# Patient Record
Sex: Male | Born: 1975
Health system: Southern US, Community
[De-identification: ages and names within clinical notes are randomized; demographics above are authoritative.]

## PROBLEM LIST (undated history)

## (undated) DIAGNOSIS — F1120 Opioid dependence, uncomplicated: Secondary | ICD-10-CM

## (undated) DIAGNOSIS — F329 Major depressive disorder, single episode, unspecified: Secondary | ICD-10-CM

## (undated) DIAGNOSIS — F32A Depression, unspecified: Secondary | ICD-10-CM

## (undated) DIAGNOSIS — F419 Anxiety disorder, unspecified: Secondary | ICD-10-CM

## (undated) DIAGNOSIS — J189 Pneumonia, unspecified organism: Secondary | ICD-10-CM

## (undated) DIAGNOSIS — F410 Panic disorder [episodic paroxysmal anxiety] without agoraphobia: Secondary | ICD-10-CM

## (undated) DIAGNOSIS — J40 Bronchitis, not specified as acute or chronic: Secondary | ICD-10-CM

## (undated) DIAGNOSIS — J449 Chronic obstructive pulmonary disease, unspecified: Secondary | ICD-10-CM

## (undated) DIAGNOSIS — N4400 Torsion of testis, unspecified: Secondary | ICD-10-CM

## (undated) HISTORY — DX: Chronic obstructive pulmonary disease, unspecified: J44.9

## (undated) HISTORY — PX: APPENDECTOMY: SHX54

## (undated) HISTORY — PX: EYE SURGERY: SHX253

## (undated) HISTORY — PX: VASECTOMY: SHX75

## (undated) HISTORY — PX: NASAL SINUS SURGERY: SHX719

---

## 2015-09-02 DIAGNOSIS — F1110 Opioid abuse, uncomplicated: Secondary | ICD-10-CM | POA: Diagnosis present

## 2015-09-02 DIAGNOSIS — Z720 Tobacco use: Secondary | ICD-10-CM | POA: Diagnosis not present

## 2015-09-02 DIAGNOSIS — R5383 Other fatigue: Secondary | ICD-10-CM | POA: Diagnosis not present

## 2015-09-02 DIAGNOSIS — Z87438 Personal history of other diseases of male genital organs: Secondary | ICD-10-CM | POA: Diagnosis not present

## 2015-09-02 DIAGNOSIS — F1123 Opioid dependence with withdrawal: Secondary | ICD-10-CM | POA: Insufficient documentation

## 2015-09-02 DIAGNOSIS — R51 Headache: Secondary | ICD-10-CM | POA: Insufficient documentation

## 2015-09-02 DIAGNOSIS — R6883 Chills (without fever): Secondary | ICD-10-CM | POA: Insufficient documentation

## 2015-09-02 DIAGNOSIS — R110 Nausea: Secondary | ICD-10-CM | POA: Diagnosis not present

## 2015-09-02 DIAGNOSIS — R002 Palpitations: Secondary | ICD-10-CM | POA: Insufficient documentation

## 2015-09-03 ENCOUNTER — Encounter (HOSPITAL_COMMUNITY): Payer: Self-pay | Admitting: Emergency Medicine

## 2015-09-03 ENCOUNTER — Emergency Department (HOSPITAL_COMMUNITY)
Admission: EM | Admit: 2015-09-03 | Discharge: 2015-09-03 | Disposition: A | Discharge status: Home / Self Care | Payer: Medicaid Other | Attending: Emergency Medicine | Admitting: Emergency Medicine

## 2015-09-03 DIAGNOSIS — F1123 Opioid dependence with withdrawal: Secondary | ICD-10-CM

## 2015-09-03 DIAGNOSIS — F1193 Opioid use, unspecified with withdrawal: Secondary | ICD-10-CM

## 2015-09-03 HISTORY — DX: Torsion of testis, unspecified: N44.00

## 2015-09-03 MED ORDER — ONDANSETRON 8 MG PO TBDP: 8.0000 mg | ORAL_TABLET | Freq: Three times a day (TID) | ORAL | Status: DC | PRN

## 2015-09-03 MED ORDER — ONDANSETRON 8 MG PO TBDP
8.0000 mg | ORAL_TABLET | Freq: Once | ORAL | Status: AC
  Administered 2015-09-03: 8 mg via ORAL
  Filled 2015-09-03: qty 1

## 2015-09-03 MED ORDER — PROMETHAZINE HCL 25 MG PO TABS: 25.0000 mg | ORAL_TABLET | Freq: Three times a day (TID) | ORAL | Status: DC | PRN

## 2015-09-03 MED ORDER — CLONIDINE HCL 0.1 MG PO TABS: 0.1000 mg | ORAL_TABLET | Freq: Two times a day (BID) | ORAL | Status: DC

## 2015-09-03 NOTE — ED Provider Notes (Signed)
CSN: [id number]     Arrival date & time 09/02/15  2340 History   First MD Initiated Contact with Patient 09/03/15 0029     Chief Complaint  Patient presents with  . Withdrawal      HPI  Patient presents for methadone withdrawal.  He reports his last dose was 2 days ago. He reports starting one day ago he has had chills, nausea, abdominal cramping, palpitations He denies active CP/SOB His course is worsening Nothing relieves his symptoms He denies any other drug or ETOH abuse He reports unable to get to his methadone clinic due to transportation issues    Past Medical History  Diagnosis Date  . Testicular torsion    Past Surgical History  Procedure Laterality Date  . Appendectomy    . Vasectomy     History reviewed. No pertinent family history. Social History  Substance Use Topics  . Smoking status: Current Every Day Smoker  . Smokeless tobacco: None  . Alcohol Use: No    Review of Systems  Constitutional: Positive for chills and fatigue. Negative for fever.  Cardiovascular: Positive for palpitations.  Gastrointestinal: Positive for nausea. Negative for diarrhea.  Neurological: Positive for dizziness and headaches.  All other systems reviewed and are negative.     Allergies  Review of patient's allergies indicates no known allergies.  Home Medications   Prior to Admission medications   Medication Sig Start Date End Date Taking? Authorizing Provider  ibuprofen (ADVIL,MOTRIN) 800 MG tablet Take 800 mg by mouth every 8 (eight) hours as needed.   Yes Historical Provider, MD  methadone (DOLOPHINE) 10 MG tablet Take 45 mg by mouth every 8 (eight) hours.   Yes Historical Provider, MD  cloNIDine (CATAPRES) 0.1 MG tablet Take 1 tablet (0.1 mg total) by mouth 2 (two) times daily. 09/03/15   [name], MD  ondansetron (ZOFRAN ODT) 8 MG disintegrating tablet Take 1 tablet (8 mg total) by mouth every 8 (eight) hours as needed for refractory nausea / vomiting.  ODT q4  hours prn nausea 09/03/15   [name], MD  promethazine (PHENERGAN) 25 MG tablet Take 1 tablet (25 mg total) by mouth every 8 (eight) hours as needed for nausea or vomiting. 09/03/15   [name], MD   BP 133/72 mmHg  Pulse 70  Temp(Src) 98.1 F (36.7 C) (Oral)  Resp 19  Ht  (1.778 m)  Wt 200 lb (90.719 kg)  BMI 28.70 kg/m2  SpO2 100% Physical Exam CONSTITUTIONAL: Well developed/well nourished HEAD: Normocephalic/atraumatic EYES: EOMI/PERRL ENMT: Mucous membranes moist NECK: supple no meningeal signs SPINE/BACK:entire spine nontender CV: S1/S2 noted, no murmurs/rubs/gallops noted LUNGS: Lungs are clear to auscultation bilaterally, no apparent distress ABDOMEN: soft, nontender, no rebound or guarding, bowel sounds noted throughout abdomen GU:no cva tenderness NEURO: Pt is awake/alert/appropriate, moves all extremitiesx4.  No facial droop.   EXTREMITIES: pulses normal/equal, full ROM SKIN: warm, color normal PSYCH: no abnormalities of mood noted, alert and oriented to situation  ED Course  Procedures  Pt well appearing No distress noted Suspect all symptoms are from opiate withdrawal He does not want narcotics Will give antiemetics and clonidine - he has used clonidine before    EKG Interpretation   Date/Time:  Sunday September 03 2015 00:15:26 EDT Ventricular Rate:  73 PR Interval:  149 QRS Duration: 86 QT Interval:  398 QTC Calculation: 439 R Axis:   39 Text Interpretation:  Sinus rhythm Normal ECG No previous ECGs available  Confirmed by Bebe Shaggy  MD,  Paarth Cropper ([id number]) on 09/03/2015 12:32:28 AM      MDM   Final diagnoses:  Opiate withdrawal    Nursing notes including past medical history and social history reviewed and considered in documentation     [name], MD 09/03/15 [phone]

## 2015-09-03 NOTE — ED Notes (Signed)
Patient reports has been going to methadone clinics for 4 years. States hasn't been able to go to clinic and last took medication on Wednesday. Patient reports he believe he is having withdrawal symptoms. Complaining of chest pain, headache, and dizziness.

## 2015-09-03 NOTE — ED Notes (Signed)
Discharge instructions given, pt demonstrated teach back and verbal understanding. No concerns voiced.  

## 2015-09-06 ENCOUNTER — Emergency Department (HOSPITAL_COMMUNITY)
Admission: EM | Admit: 2015-09-06 | Discharge: 2015-09-06 | Disposition: A | Discharge status: Home / Self Care | Payer: Medicaid Other | Attending: Emergency Medicine | Admitting: Emergency Medicine

## 2015-09-06 ENCOUNTER — Encounter (HOSPITAL_COMMUNITY): Payer: Self-pay | Admitting: Emergency Medicine

## 2015-09-06 DIAGNOSIS — Z87438 Personal history of other diseases of male genital organs: Secondary | ICD-10-CM | POA: Diagnosis not present

## 2015-09-06 DIAGNOSIS — F1123 Opioid dependence with withdrawal: Secondary | ICD-10-CM | POA: Insufficient documentation

## 2015-09-06 DIAGNOSIS — F1193 Opioid use, unspecified with withdrawal: Secondary | ICD-10-CM

## 2015-09-06 DIAGNOSIS — R42 Dizziness and giddiness: Secondary | ICD-10-CM | POA: Diagnosis present

## 2015-09-06 DIAGNOSIS — Z720 Tobacco use: Secondary | ICD-10-CM | POA: Diagnosis not present

## 2015-09-06 DIAGNOSIS — Z79899 Other long term (current) drug therapy: Secondary | ICD-10-CM | POA: Insufficient documentation

## 2015-09-06 MED ORDER — CLONIDINE HCL 0.1 MG PO TABS: 0.1000 mg | ORAL_TABLET | Freq: Two times a day (BID) | ORAL | Status: DC

## 2015-09-06 MED ORDER — ONDANSETRON 4 MG PO TBDP
4.0000 mg | ORAL_TABLET | Freq: Once | ORAL | Status: AC
  Administered 2015-09-06: 4 mg via ORAL
  Filled 2015-09-06: qty 1

## 2015-09-06 MED ORDER — HYDROXYZINE HCL 25 MG PO TABS: 25.0000 mg | ORAL_TABLET | Freq: Four times a day (QID) | ORAL | Status: DC | PRN

## 2015-09-06 MED ORDER — DICYCLOMINE HCL 20 MG PO TABS: 20.0000 mg | ORAL_TABLET | Freq: Two times a day (BID) | ORAL | Status: DC

## 2015-09-06 MED ORDER — KETOROLAC TROMETHAMINE 60 MG/2ML IM SOLN
60.0000 mg | Freq: Once | INTRAMUSCULAR | Status: AC
  Administered 2015-09-06: 60 mg via INTRAMUSCULAR
  Filled 2015-09-06: qty 2

## 2015-09-06 MED ORDER — ONDANSETRON 4 MG PO TBDP: 4.0000 mg | ORAL_TABLET | Freq: Three times a day (TID) | ORAL | Status: DC | PRN

## 2015-09-06 MED ORDER — LORAZEPAM 2 MG/ML IJ SOLN
1.0000 mg | Freq: Once | INTRAMUSCULAR | Status: AC
  Administered 2015-09-06: 1 mg via INTRAMUSCULAR
  Filled 2015-09-06: qty 1

## 2015-09-06 MED ORDER — DICYCLOMINE HCL 10 MG/ML IM SOLN
20.0000 mg | Freq: Once | INTRAMUSCULAR | Status: AC
  Administered 2015-09-06: 20 mg via INTRAMUSCULAR
  Filled 2015-09-06: qty 2

## 2015-09-06 NOTE — Discharge Instructions (Signed)
Opioid Withdrawal °Opioids are a group of narcotic drugs. They include the street drug heroin. They also include pain medicines, such as morphine, hydrocodone, oxycodone, and fentanyl. Opioid withdrawal is a group of characteristic physical and mental signs and symptoms. It typically occurs if you have been using opioids daily for several weeks or longer and stop using or rapidly decrease use. Opioid withdrawal can also occur if you have used opioids daily for a long time and are given a medicine to block the effect.  °SIGNS AND SYMPTOMS °Opioid withdrawal includes three or more of the following symptoms:  °· Depressed, anxious, or irritable mood. °· Nausea or vomiting. °· Muscle aches or spasms.   °· Watery eyes.    °· Runny nose. °· Dilated pupils, sweating, or hairs standing on end. °· Diarrhea or intestinal cramping. °· Yawning.   °· Fever. °· Increased blood pressure. °· Fast pulse. °· Restlessness or trouble sleeping. °These signs and symptoms occur within several hours of stopping or reducing short-acting opioids, such as heroin. They can occur within 3 days of stopping or reducing long-acting opioids, such as methadone. Withdrawal begins within minutes of receiving a drug that blocks the effects of opioids, such as naltrexone or naloxone. °DIAGNOSIS  °Opioid use disorder is diagnosed by your health care provider. You will be asked about your symptoms, drug and alcohol use, medical history, and use of medicines. A physical exam may be done. Lab tests may be ordered. Your health care provider may have you see a mental health professional.  °TREATMENT  °The treatment for opioid withdrawal is usually provided by medical doctors with special training in substance use disorders (addiction specialists). The following medicines may be included in treatment: °· Opioids given in place of the abused opioid. They turn on opioid receptors in the brain and lessen or prevent withdrawal symptoms. They are gradually  decreased (opioid substitution and taper). °· Non-opioids that can lessen certain opioid withdrawal symptoms. They may be used alone or with opioid substitution and taper. °Successful long-term recovery usually requires medicine, counseling, and group support. °HOME CARE INSTRUCTIONS  °· Take medicines only as directed by your health care provider. °· Check with your health care provider before starting new medicines. °· Keep all follow-up visits as directed by your health care provider. °SEEK MEDICAL CARE IF: °· You are not able to take your medicines as directed. °· Your symptoms get worse. °· You relapse. °SEEK IMMEDIATE MEDICAL CARE IF: °· You have serious thoughts about hurting yourself or others. °· You have a seizure. °· You lose consciousness. °Document Released: 12/19/2003 Document Revised: 05/02/2014 Document Reviewed: 12/29/2013 °ExitCare® Patient Information ©2015 [employer]. This information is not intended to replace advice given to you by your health care provider. Make sure you discuss any questions you have with your health care provider. ° °Emergency Department Resource Guide °1) Find a Doctor and Pay Out of Pocket °Although you won't have to find out who is covered by your insurance plan, it is a good idea to ask around and get recommendations. You will then need to call the office and see if the doctor you have chosen will accept you as a new patient and what types of options they offer for patients who are self-pay. Some doctors offer discounts or will set up payment plans for their patients who do not have insurance, but you will need to ask so you aren't surprised when you get to your appointment. ° °2) Contact Your Local Health Department °Not all health departments have   doctors that can see patients for sick visits, but many do, so it is worth a call to see if yours does. If you don't know where your local health department is, you can check in your phone book. The CDC also has a tool to  help you locate your state's health department, and many state websites also have listings of all of their local health departments. ° °3) Find a Walk-in Clinic °If your illness is not likely to be very severe or complicated, you may want to try a walk in clinic. These are popping up all over the country in pharmacies, drugstores, and shopping centers. They're usually staffed by nurse practitioners or physician assistants that have been trained to treat common illnesses and complaints. They're usually fairly quick and inexpensive. However, if you have serious medical issues or chronic medical problems, these are probably not your best option. ° °No Primary Care Doctor: °- Call [facility] at  832-8000 - they can help you locate a primary care doctor that  accepts your insurance, provides certain services, etc. °- Physician Referral Service- 1-800-533-3463 ° °Chronic Pain Problems: °Organization         Address  Phone   Notes  °[facility]  (336) 297-2271 Patients need to be referred by their primary care doctor.  ° °Medication Assistance: °Organization         Address  Phone   Notes  °[place] [facility] [address] °[place], [place] [address] (336) 641-8030 --Must be a resident of [place] °-- Must have NO insurance coverage whatsoever (no Medicaid/ Medicare, etc.) °-- The pt. MUST have a primary care doctor that directs their care regularly and follows them in the community °  °[facility]  (866) 331-1348   °[employer]  (888) 892-1162   ° °Agencies that provide inexpensive medical care: °Organization         Address  Phone   Notes  °[facility]  (336) 832-8035   ° Internal Medicine    (336) 832-7272   °[facility] [address] °[place], [place] [address] (336) 832-4777   °[facility] [address], °[place] (336) 271-4999   °[facility]    (336) 373-0678   °[facility]    (336) 272-1050   °[facility] ° [address], [place] Phone:  (336) 832-4444, Fax:  (336) 832-4440 Hours of Operation:  9 am - 6 pm, M-F.  Also accepts Medicaid/Medicare and self-pay.  °[facility] ° [address], [place] Phone: (336) 832-3150, Fax: (336) 832-3151. Hours of Operation:  8:30 am - 5:30 pm, M-F.  Also accepts Medicaid and self-pay.  °[facility] [place] [address], [place] Phone: (336) 878-6027   °[facility] [address], [place], [place] (336)723-1848, Ext. 123 Mondays & Thursdays: 7-9 AM.  First 15 patients are seen on a first come, first serve basis. °  ° °Medicaid-accepting [place] Providers: ° °Organization         Address  Phone   Notes  °[facility] [address], [place] (336) 641-2100 Also accepts self-pay patients.  °[facility] [address], [place] ° (336) 856-9996   °[facility] [address], [place] (336) 288-8857   °[facility] [address]   [place] [address], [place] (336) 299-7000   °Veita Bland [address], [place]  ° (336) 373-1557 Only accepts [place] Access Medicaid patients after they have their name applied to their card.  ° °Self-Pay (no insurance) in [place]: ° °Organization         Address  Phone   Notes  °Sickle Cell Patients, [facility] [address], [place] (336) 832-1970   °[facility] [address], [place] (336) 832-4400   °[facility] [place] ° [address], [place] (336) 992-4800   °[facility]/Dr. Osei-Bonsu ° [address] [place] [address], [place] or [address], [place] (336) 841-8500 Phone number for both [place] and [place] locations is the same.  °[facility] [address],  [place] (336) 299-0000   °[facility] [address] [place] [address], [place] or [address] (336) 852-7530 °(336) 878-2260   °[facility] [address], [place] (336) 350-1642, phone; (336) 294-5005, fax Sees patients 1st and 3rd Saturday of every month.  Must not qualify for public or private insurance (i.e. Medicaid, Medicare, [place] Health Choice, Veterans' Benefits) • Household income should be no more than 200% of the poverty level •The clinic cannot treat you if you are pregnant or think you are pregnant • Sexually transmitted diseases are not treated at the clinic.  ° ° °Dental Care: °Organization         Address  Phone  Notes  °[facility] [address], [place] (336) 641-6152 Accepts children up to age 21 who are enrolled in Medicaid or [place] Health Choice; pregnant women with a Medicaid card; and children who have applied for Medicaid or [place] Health Choice, but were declined, whose parents can pay a reduced fee at time of service.  °[facility] [place]  [address], [place] (336) 641-7733 Accepts children up to age 21 who are enrolled in Medicaid or [place] Health Choice; pregnant women with a Medicaid card; and children who have applied for Medicaid or  Health Choice, but were declined, whose parents can pay a reduced fee at time of service.  °[facility] ° [address], [place] (336) 641-4533 Patients are seen by appointment only. Walk-ins are not accepted. [facility] will see patients 18 years of age and older. °Monday - Tuesday (8am-5pm) °Most Wednesdays (8:30-5pm) °$30 per visit, cash only  °[facility] ° [address], [place] (336) 641-4533 Patients are seen by appointment only. Walk-ins are not accepted. [facility] will see patients 18 years of age and older. °One Wednesday Evening  (Monthly: Volunteer Based).  $30 per visit, cash only  °[facility]  (919) 537-3737 for adults; Children under age 4, call Graduate Pediatric Dentistry at (919) [phone]. Children aged 4-14, please call (919) [phone] to request a pediatric application. ° Dental services are provided in all areas of dental care including fillings, crowns and bridges, complete and partial dentures, implants, gum treatment, root canals, and extractions. Preventive care is also provided. Treatment is provided to both adults and children. °Patients are selected via a lottery and there is often a waiting list. °  °[facility] [address], °[place] ° (336) 763-8833 [email or web address] °  °[facility] [address], [place], [place] (336)723-1848, Ext. 123 Second and Fourth Thursday of each month, opens at 6:30 AM; Clinic ends at 9 AM.  Patients are seen on a first-come first-served basis, and a limited number are seen during each clinic.  ° °[facility] ° [address], [place], [place] (336) 723-7904   Eligibility Requirements °You must have lived in [place], [place], or [place] counties for at least the last three months. °  You cannot be eligible for state or federal sponsored healthcare insurance, including Veterans Administration, Medicaid, or Medicare. °  You generally cannot be eligible for healthcare insurance through your employer.  °  How to apply: °Eligibility screenings are held every Tuesday and Wednesday afternoon from 1:00 pm until 4:00 pm. You do not need an appointment for the interview!  °[facility] [address], [place], [place] 336-631-2330   °[facility]  336-342-8273   °[facility]  336-703-3100   °[facility]  336-570-6415   ° °Behavioral Health Resources in the Community: °Intensive Outpatient Programs °Organization         Address  Phone  Notes  °[place]  [facility] [address], [place], [place] 336-878-6098   °[facility] [address], [place], [place] 336-832-9800   °[facility] [address], [place], [place] ° 336-882-2125   °[facility] [address],  °[place], [place] 1-800-853-5163 or 336-641-4981   °Substance Abuse Resources °Organization         Address  Phone  Notes  °[facility]  336-882-2125   °[facility]  336-784-9470   °[facility]  336-285-9073   °[facility]  336-845-3988   °Residential & Outpatient Substance Abuse Program  1-800-659-3381   °Psychological Services °Organization         Address  Phone  Notes  °[facility]  336- 832-9600   °[facility]  336- 378-7881   °[facility] [address], [place] 1-800-853-5163 or 336-641-4981   ° °Mobile Crisis Teams °Organization         Address  Phone  Notes  °[facility], Mobile Crisis Care Unit  1-877-626-1772   °Assertive °[facility] ° [address] [place], [place] 336-834-9664   °Sharon DeEsch [address] °[place] 336-554-5454   ° °Self-Help/Support Groups °Organization         Address  Phone             Notes  °[facility] - variety of support groups  336- 373-1402 Call for more information  °Narcotics Anonymous (NA), [facility] [address], °[place]  2 meetings at this location  ° °Residential Treatment Programs °Organization         Address  Phone  Notes  °[facility] [address],    °[place]  1-866-801-8205   °[facility] ° [address], [place], [place] 704-293-8524   °[facility] [address], [place] 336-845-3988 Admissions: 8am-3pm M-F  °[facility] [address]    °[place], [place] 336-841-1104   °[facility] [address],  [place], [place] 336-379-7146   °[facility] [address]  °[place],  336-285-9073   °[facility] Intensive Outpatient [address], [place],   [place] 336-852-3033   °[facility] [address]  °[place], [place] 1-877-615-2722 or 336-784-9470   °[facility] [address] [place], [place] 336-227-7417 Accepts Medicaid  °[facility] [address]  °[place] 1-800-659-3381 Substance Abuse/Addiction Treatment  ° °[place] Behavioral Health Resources °Organization         Address  Phone  Notes  °[facility]  (888) 581-9988   °Julie Brannon, PhD [address] [place], [place]   (336) 349-5553 or (336) 951-0000   °[facility]   [address] °[place], [place] (336) 349-4454   °[facility] [address], [place], [place] (336) 342-8316 Insurance/Medicaid/sponsorship through [facility]  °[facility] [address]                                    [place], [place] (336) 342-8316 Therapy/tele-psych/case  °[facility] [address]  ° [place], [place] (336) 349-2233    °Dr. Arfeen  (336) 349-4544   °[facility] [place]  [employer] [place] [facility] 1) [address], [place] °2) [address], [place] °3)  [address], [place] (336) 349-3220 °(336) 342-7768 ° °(336) 342-8140   °[place] [facility] (336) 342-1394 or (336) 342-3537 (After Hours)    ° ° °

## 2015-09-06 NOTE — ED Notes (Signed)
Pt states he is unable to get the clonidine prescription filled; pt states he is not seeking narcotics he just wants something to help him with his withdrawal symptoms

## 2015-09-06 NOTE — ED Notes (Signed)
Pt c/o withdrawals symptoms from coming off methadone 

## 2015-09-06 NOTE — ED Provider Notes (Signed)
This chart was scribed for [name], DO by Budd Palmer, ED Scribe. This patient was seen in room [facility]18/[facility]18 and the patient's care was started at 12:57 AM.  TIME SEEN: 12:57 AM   CHIEF COMPLAINT: Withdrawal  HPI: John [name] is a 39 y.o. male who presents to the Emergency Department complaining of withdrawal from methadone 40 mg onset 4 days ago. He reports associated panic attacks, abdominal cramping, loss of appetite, dizziness, inability to sleep, and skin hypersensitivity. He notes he has been clean for 2 years from here when has been on methadone. He states he has been tapering off from 110 mg, to 80 mg, and then down to 40 mg. He states he has has not been able to get his methadone for the past 4 days because of transportation issues. Was seen in the emergency department on September 4. Was discharged with clonidine, Zofran and Phenergan. He states he is taking clonidine with mild relief. He does not want any narcotics. Pt denies SI or HI.  ROS: See HPI Constitutional: no fever  Eyes: no drainage  ENT: no runny nose   Cardiovascular:  no chest pain  Resp: no SOB  GI: no vomiting GU: no dysuria Integumentary: no rash  Allergy: no hives  Musculoskeletal: no leg swelling  Neurological: no slurred speech ROS otherwise negative  PAST MEDICAL HISTORY/PAST SURGICAL HISTORY:  Past Medical History  Diagnosis Date  . Testicular torsion     MEDICATIONS:  Prior to Admission medications   Medication Sig Start Date End Date Taking? Authorizing Provider  cloNIDine (CATAPRES) 0.1 MG tablet Take 1 tablet (0.1 mg total) by mouth 2 (two) times daily. 09/03/15   [name], MD  ibuprofen (ADVIL,MOTRIN) 800 MG tablet Take 800 mg by mouth every 8 (eight) hours as needed.    Historical Provider, MD  methadone (DOLOPHINE) 10 MG tablet Take 45 mg by mouth every 8 (eight) hours.    Historical Provider, MD  ondansetron (ZOFRAN ODT) 8 MG disintegrating tablet Take 1 tablet (8 mg total) by  mouth every 8 (eight) hours as needed for refractory nausea / vomiting. 8mg  ODT q4 hours prn nausea 09/03/15   [name], MD  promethazine (PHENERGAN) 25 MG tablet Take 1 tablet (25 mg total) by mouth every 8 (eight) hours as needed for nausea or vomiting. 09/03/15   [name], MD    ALLERGIES:  No Known Allergies  SOCIAL HISTORY:  Social History  Substance Use Topics  . Smoking status: Current Every Day Smoker  . Smokeless tobacco: Not on file  . Alcohol Use: No    FAMILY HISTORY: History reviewed. No pertinent family history.  EXAM: BP 121/84 mmHg  Pulse 71  Temp(Src) 97.9 F (36.6 C)  Resp 18  Ht 5\' 10"  (1.778 m)  Wt 200 lb (90.719 kg)  BMI 28.70 kg/m2  SpO2 97% CONSTITUTIONAL: Alert and oriented and responds appropriately to questions. Well-appearing; well-nourished, does not appear in any significant distress, appears well-hydrated, afebrile, nontoxic appearing HEAD: Normocephalic EYES: Conjunctivae clear, PERRL ENT: normal nose; no rhinorrhea; moist mucous membranes; pharynx without lesions noted NECK: Supple, no meningismus, no LAD  CARD: RRR; S1 and S2 appreciated; no murmurs, no clicks, no rubs, no gallops RESP: Normal chest excursion without splinting or tachypnea; breath sounds clear and equal bilaterally; no wheezes, no rhonchi, no rales, no hypoxia or respiratory distress, speaking full sentences ABD/GI: Normal bowel sounds; non-distended; soft, non-tender, no rebound, no guarding, no peritoneal signs BACK:  The back appears normal  and is non-tender to palpation, there is no CVA tenderness EXT: Normal ROM in all joints; non-tender to palpation; no edema; normal capillary refill; no cyanosis, no calf tenderness or swelling    SKIN: Normal color for age and race; warm NEURO: Moves all extremities equally, sensation to light touch intact diffusely, cranial nerves II through XII intact, normal gait, patient is not tremulous PSYCH: The patient's mood and manner  are appropriate. Grooming and personal hygiene are appropriate. Denies SI or HI.  MEDICAL DECISION MAKING: Patient here with opiate withdrawal. He is hemodynamically stable and does not appear to be in significant distress. Has been taking clonidine with some relief reports that he has 2 tablets left. Will refill his clonidine, Zofran. Will also discharge with Vistaril and Bentyl to use as needed for sometimes. Have provided him outpatient resources. No withdrawal from benzodiazepines or alcohol. No psychiatric safety concerns. Discussed return precautions. I feel he is safe to be discharged without further emergent workup. He verbalized understanding and is comfortable with plan.  I personally performed the services described in this documentation, which was scribed in my presence. The recorded information has been reviewed and is accurate.   [name], DO 09/06/15 0140

## 2015-09-10 ENCOUNTER — Emergency Department (HOSPITAL_COMMUNITY)
Admission: EM | Admit: 2015-09-10 | Discharge: 2015-09-11 | Disposition: A | Discharge status: Home / Self Care | Payer: Medicaid Other | Attending: Emergency Medicine | Admitting: Emergency Medicine

## 2015-09-10 ENCOUNTER — Encounter (HOSPITAL_COMMUNITY): Payer: Self-pay | Admitting: Emergency Medicine

## 2015-09-10 DIAGNOSIS — F329 Major depressive disorder, single episode, unspecified: Secondary | ICD-10-CM | POA: Insufficient documentation

## 2015-09-10 DIAGNOSIS — F410 Panic disorder [episodic paroxysmal anxiety] without agoraphobia: Secondary | ICD-10-CM | POA: Diagnosis not present

## 2015-09-10 DIAGNOSIS — Z720 Tobacco use: Secondary | ICD-10-CM | POA: Diagnosis not present

## 2015-09-10 DIAGNOSIS — Z765 Malingerer [conscious simulation]: Secondary | ICD-10-CM

## 2015-09-10 DIAGNOSIS — Z87438 Personal history of other diseases of male genital organs: Secondary | ICD-10-CM | POA: Diagnosis not present

## 2015-09-10 DIAGNOSIS — F32A Depression, unspecified: Secondary | ICD-10-CM

## 2015-09-10 HISTORY — DX: Anxiety disorder, unspecified: F41.9

## 2015-09-10 HISTORY — DX: Major depressive disorder, single episode, unspecified: F32.9

## 2015-09-10 HISTORY — DX: Depression, unspecified: F32.A

## 2015-09-10 MED ORDER — ONDANSETRON HCL 4 MG PO TABS: 4.0000 mg | ORAL_TABLET | Freq: Three times a day (TID) | ORAL | Status: DC | PRN

## 2015-09-10 MED ORDER — ALUM & MAG HYDROXIDE-SIMETH 200-200-20 MG/5ML PO SUSP: 30.0000 mL | ORAL | Status: DC | PRN

## 2015-09-10 MED ORDER — NICOTINE 21 MG/24HR TD PT24: 21.0000 mg | MEDICATED_PATCH | Freq: Every day | TRANSDERMAL | Status: DC

## 2015-09-10 MED ORDER — IBUPROFEN 200 MG PO TABS: 600.0000 mg | ORAL_TABLET | Freq: Three times a day (TID) | ORAL | Status: DC | PRN

## 2015-09-10 MED ORDER — ACETAMINOPHEN 325 MG PO TABS: 650.0000 mg | ORAL_TABLET | ORAL | Status: DC | PRN

## 2015-09-10 NOTE — ED Provider Notes (Addendum)
TIME SEEN: 11:05 PM   CHIEF COMPLAINT: Depression   HPI: HPI Comments: John [name] is a 39 y.o. male who presents to the Emergency Department complaining of Depression onset several days prior. Pt states that he feels like his life is "falling apart". Pt states that he has a diabetic son that recently went into DKA, lost his job, his car broke down and is about to be evicted. He states that he is not able to sleep or eat due to the recent stressors in his life. Pt reports an increase in anxiety attacks as well during this time. Pt reports general HI but without a plan. He states that he has homicidal thoughts towards "people that piss me off." Pt denies recent alcohol or drug use. Pt denies SI or other related symptoms.   ROS: See HPI Constitutional: no fever  Eyes: no drainage  ENT: no runny nose   Cardiovascular:  no chest pain  Resp: no SOB  GI: no vomiting GU: no dysuria Integumentary: no rash  Allergy: no hives  Musculoskeletal: no leg swelling  Neurological: no slurred speech ROS otherwise negative  PAST MEDICAL HISTORY/PAST SURGICAL HISTORY:  Past Medical History  Diagnosis Date  . Testicular torsion   . Depression   . Anxiety     MEDICATIONS:  Prior to Admission medications   Medication Sig Start Date End Date Taking? Authorizing Provider  cloNIDine (CATAPRES) 0.1 MG tablet Take 1 tablet (0.1 mg total) by mouth 2 (two) times daily. 09/03/15   [name], MD  cloNIDine (CATAPRES) 0.1 MG tablet Take 1 tablet (0.1 mg total) by mouth 2 (two) times daily. 09/06/15   Masiel Gentzler N Rayna Brenner, DO  dicyclomine (BENTYL) 20 MG tablet Take 1 tablet (20 mg total) by mouth 2 (two) times daily. 09/06/15   Rahil Passey N Latif Nazareno, DO  hydrOXYzine (ATARAX/VISTARIL) 25 MG tablet Take 1 tablet (25 mg total) by mouth every 6 (six) hours as needed for anxiety. 09/06/15   Amarria Andreasen N Eldar Robitaille, DO  ibuprofen (ADVIL,MOTRIN) 800 MG tablet Take 800 mg by mouth every 8 (eight) hours as needed.    Historical Provider, MD   methadone (DOLOPHINE) 10 MG tablet Take 45 mg by mouth every 8 (eight) hours.    Historical Provider, MD  ondansetron (ZOFRAN ODT) 4 MG disintegrating tablet Take 1 tablet (4 mg total) by mouth every 8 (eight) hours as needed for nausea or vomiting. 09/06/15   Kaleea Penner N Yasamin Karel, DO  ondansetron (ZOFRAN ODT) 8 MG disintegrating tablet Take 1 tablet (8 mg total) by mouth every 8 (eight) hours as needed for refractory nausea / vomiting. 8mg  ODT q4 hours prn nausea 09/03/15   [name], MD  promethazine (PHENERGAN) 25 MG tablet Take 1 tablet (25 mg total) by mouth every 8 (eight) hours as needed for nausea or vomiting. 09/03/15   [name], MD    ALLERGIES:  No Known Allergies  SOCIAL HISTORY:  Social History  Substance Use Topics  . Smoking status: Current Every Day Smoker  . Smokeless tobacco: Not on file  . Alcohol Use: No    FAMILY HISTORY: No family history on file.  EXAM: BP 133/76 mmHg  Pulse 66  Temp(Src) 98 F (36.7 C) (Oral)  Resp 18  Ht 5\' 10"  (1.778 m)  Wt 200 lb (90.719 kg)  BMI 28.70 kg/m2  SpO2 99%   CONSTITUTIONAL: Alert and oriented and responds appropriately to questions. Well-appearing; well-nourished HEAD: Normocephalic EYES: Conjunctivae clear, PERRL ENT: normal nose; no rhinorrhea; moist mucous membranes;  pharynx without lesions noted NECK: Supple, no meningismus, no LAD  CARD: RRR; S1 and S2 appreciated; no murmurs, no clicks, no rubs, no gallops RESP: Normal chest excursion without splinting or tachypnea; breath sounds clear and equal bilaterally; no wheezes, no rhonchi, no rales, no hypoxia or respiratory distress, speaking full sentences ABD/GI: Normal bowel sounds; non-distended; soft, non-tender, no rebound, no guarding, no peritoneal signs BACK:  The back appears normal and is non-tender to palpation, there is no CVA tenderness EXT: Normal ROM in all joints; non-tender to palpation; no edema; normal capillary refill; no cyanosis, no calf  tenderness or swelling    SKIN: Normal color for age and race; warm, dry NEURO: Moves all extremities equally, sensation to light touch intact diffusely, cranial nerves II through XII intact, no tremors PSYCH: No SI but pt has Homicidal thoughts toward" people that piss me off"   MEDICAL DECISION MAKING: Pt here with worsening depression and panic attacks. States he has homicidal thoughts towards "people who placed me off". Does not have anyone in specific that he wants to hurt. Denies SI. Given his homicidal thoughts, will consult TTS. Will obtain screening labs and urine. He agrees with this plan. No current medical complaints. Was seen on 9/4 in 9/7 for symptoms of opiate withdrawal. Has not been on methadone since 9/3. No sign of withdrawal currently.  ED PROGRESS: 1:15 AM  Pt now reports he wants to go. States his child is in the PICU and he once to go upstairs. Patient denies SI or HIcurrently. States "I was just being on asshole".  He is able to contract for safety. When asked what he was hoping to gain from this visit today, patient states he was hoping to get medication for his panic attacks. I have a suspicion that there is drug seeking behavior as he has repeatedly requested benzodiazepines during this visit as well as the last visit at [facility]. I do not feel they are currently indicated as he appears very comfortable, not anxious and no sign of withdrawal. Have advised him to follow-up with outpatient provider for this. He has been evaluated by TTS and they have recommended discussion with Behavioral Health extender. Will reconsult TTS to discuss recommendations.  1:40 AM  D/w Corrie Dandy with TTS to his discussion patient's care with [facility]. They recommended outpatient treatment. They state he is ready made contact with [facility] in [facility] Will provide him a resource guide again. They recommend avoiding benzodiazepines in this patient given his drug history and  recommend treating his anxiety with this Vistaril. We'll provide prescription for Vistaril. We'll discharge patient home. He contracts for safety.  I personally performed the services described in this documentation, which was scribed in my presence. The recorded information has been reviewed and is accurate.     Layla Maw Teasia Zapf, DO 09/11/15 0139   2:20 AM  Pt repeatedly asking for Ativan throughout his entire visit. He is upset because he has been prescribed Vistaril however he told me that this helped him previously. I've been instructed by TTS to avoid benzodiazepines and I agree. I strongly feel that patient is here seeking drugs.  Layla Maw Xariah Silvernail, DO 09/11/15 0221
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
## 2015-09-11 LAB — COMPREHENSIVE METABOLIC PANEL
ALBUMIN: 3.8 g/dL (ref 3.5–5.0)
ALT: 19 U/L (ref 17–63)
AST: 17 U/L (ref 15–41)
Alkaline Phosphatase: 94 U/L (ref 38–126)
Anion gap: 9 (ref 5–15)
BUN: 19 mg/dL (ref 6–20)
CHLORIDE: 105 mmol/L (ref 101–111)
CO2: 26 mmol/L (ref 22–32)
CREATININE: 0.85 mg/dL (ref 0.61–1.24)
Calcium: 9.1 mg/dL (ref 8.9–10.3)
GFR calc Af Amer: >60 mL/min (ref >60)
GFR calc non Af Amer: >60 mL/min (ref >60)
Glucose, Bld: 137 mg/dL — ABNORMAL HIGH (ref 65–99)
Potassium: 3.5 mmol/L (ref 3.5–5.1)
SODIUM: 140 mmol/L (ref 135–145)
Total Bilirubin: 0.4 mg/dL (ref 0.3–1.2)
Total Protein: 6.7 g/dL (ref 6.5–8.1)

## 2015-09-11 LAB — CBC WITH DIFFERENTIAL/PLATELET
Basophils Absolute: 0 10*3/uL (ref 0.0–0.1)
Basophils Relative: 0 % (ref 0–1)
EOS PCT: 5 % (ref 0–5)
Eosinophils Absolute: 0.5 10*3/uL (ref 0.0–0.7)
HCT: 39.8 % (ref 39.0–52.0)
Hemoglobin: 13.6 g/dL (ref 13.0–17.0)
LYMPHS ABS: 3 10*3/uL (ref 0.7–4.0)
LYMPHS PCT: 30 % (ref 12–46)
MCH: 29.8 pg (ref 26.0–34.0)
MCHC: 34.2 g/dL (ref 30.0–36.0)
MCV: 87.1 fL (ref 78.0–100.0)
MONO ABS: 0.8 10*3/uL (ref 0.1–1.0)
Monocytes Relative: 7 % (ref 3–12)
Neutro Abs: 5.9 10*3/uL (ref 1.7–7.7)
Neutrophils Relative %: 58 % (ref 43–77)
PLATELETS: 233 10*3/uL (ref 150–400)
RBC: 4.57 MIL/uL (ref 4.22–5.81)
RDW: 13.4 % (ref 11.5–15.5)
WBC: 10.2 10*3/uL (ref 4.0–10.5)

## 2015-09-11 LAB — ETHANOL: Alcohol, Ethyl (B): <5 mg/dL (ref <5)

## 2015-09-11 MED ORDER — HYDROXYZINE HCL 25 MG PO TABS: 25.0000 mg | ORAL_TABLET | Freq: Once | ORAL | Status: DC

## 2015-09-11 MED ORDER — HYDROXYZINE HCL 25 MG PO TABS
25.0000 mg | ORAL_TABLET | Freq: Four times a day (QID) | ORAL | Status: DC | PRN
Start: 2015-09-11 — End: 2016-04-09
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
## 2015-09-11 NOTE — Discharge Instructions (Signed)
Depression °Depression refers to feeling sad, low, down in the dumps, blue, gloomy, or empty. In general, there are two kinds of depression: °· Normal sadness or normal grief. This kind of depression is one that we all feel from time to time after upsetting life experiences, such as the loss of a job or the ending of a relationship. This kind of depression is considered normal, is short lived, and resolves within a few days to 2 weeks. Depression experienced after the loss of a loved one (bereavement) often lasts longer than 2 weeks but normally gets better with time. °· Clinical depression. This kind of depression lasts longer than normal sadness or normal grief or interferes with your ability to function at home, at work, and in school. It also interferes with your personal relationships. It affects almost every aspect of your life. Clinical depression is an illness. °Symptoms of depression can also be caused by conditions other than those mentioned above, such as: °· Physical illness. Some physical illnesses, including underactive thyroid gland (hypothyroidism), severe anemia, specific types of cancer, diabetes, uncontrolled seizures, heart and lung problems, strokes, and chronic pain are commonly associated with symptoms of depression. °· Side effects of some prescription medicine. In some people, certain types of medicine can cause symptoms of depression. °· Substance abuse. Abuse of alcohol and illicit drugs can cause symptoms of depression. °SYMPTOMS °Symptoms of normal sadness and normal grief include the following: °· Feeling sad or crying for short periods of time. °· Not caring about anything (apathy). °· Difficulty sleeping or sleeping too much. °· No longer able to enjoy the things you used to enjoy. °· Desire to be by oneself all the time (social isolation). °· Lack of energy or motivation. °· Difficulty concentrating or remembering. °· Change in appetite or weight. °· Restlessness or  agitation. °Symptoms of clinical depression include the same symptoms of normal sadness or normal grief and also the following symptoms: °· Feeling sad or crying all the time. °· Feelings of guilt or worthlessness. °· Feelings of hopelessness or helplessness. °· Thoughts of suicide or the desire to harm yourself (suicidal ideation). °· Loss of touch with reality (psychotic symptoms). Seeing or hearing things that are not real (hallucinations) or having false beliefs about your life or the people around you (delusions and paranoia). °DIAGNOSIS  °The diagnosis of clinical depression is usually based on how bad the symptoms are and how long they have lasted. Your health care provider will also ask you questions about your medical history and substance use to find out if physical illness, use of prescription medicine, or substance abuse is causing your depression. Your health care provider may also order blood tests. °TREATMENT  °Often, normal sadness and normal grief do not require treatment. However, sometimes antidepressant medicine is given for bereavement to ease the depressive symptoms until they resolve. °The treatment for clinical depression depends on how bad the symptoms are but often includes antidepressant medicine, counseling with a mental health professional, or both. Your health care provider will help to determine what treatment is best for you. °Depression caused by physical illness usually goes away with appropriate medical treatment of the illness. If prescription medicine is causing depression, talk with your health care provider about stopping the medicine, decreasing the dose, or changing to another medicine. °Depression caused by the abuse of alcohol or illicit drugs goes away when you stop using these substances. Some adults need professional help in order to stop drinking or using drugs. °SEEK IMMEDIATE MEDICAL   CARE IF: °· You have thoughts about hurting yourself or others. °· You lose touch  with reality (have psychotic symptoms). °· You are taking medicine for depression and have a serious side effect. °FOR MORE INFORMATION °· National Alliance on Mental Illness: www.nami.org  °· National Institute of Mental Health: www.nimh.nih.gov  °Document Released: 12/13/2000 Document Revised: 05/02/2014 Document Reviewed: 03/16/2012 °ExitCare® Patient Information ©2015 [employer]. This information is not intended to replace advice given to you by your health care provider. Make sure you discuss any questions you have with your health care provider. ° °Panic Attacks °Panic attacks are sudden, short-lived surges of severe anxiety, fear, or discomfort. They may occur for no reason when you are relaxed, when you are anxious, or when you are sleeping. Panic attacks may occur for a number of reasons:  °· Healthy people occasionally have panic attacks in extreme, life-threatening situations, such as war or natural disasters. Normal anxiety is a protective mechanism of the body that helps us react to danger (fight or flight response). °· Panic attacks are often seen with anxiety disorders, such as panic disorder, social anxiety disorder, generalized anxiety disorder, and phobias. Anxiety disorders cause excessive or uncontrollable anxiety. They may interfere with your relationships or other life activities. °· Panic attacks are sometimes seen with other mental illnesses, such as depression and posttraumatic stress disorder. °· Certain medical conditions, prescription medicines, and drugs of abuse can cause panic attacks. °SYMPTOMS  °Panic attacks start suddenly, peak within 20 minutes, and are accompanied by four or more of the following symptoms: °· Pounding heart or fast heart rate (palpitations). °· Sweating. °· Trembling or shaking. °· Shortness of breath or feeling smothered. °· Feeling choked. °· Chest pain or discomfort. °· Nausea or strange feeling in your stomach. °· Dizziness, light-headedness, or feeling  like you will faint. °· Chills or hot flushes. °· Numbness or tingling in your lips or hands and feet. °· Feeling that things are not real or feeling that you are not yourself. °· Fear of losing control or going crazy. °· Fear of dying. °Some of these symptoms can mimic serious medical conditions. For example, you may think you are having a heart attack. Although panic attacks can be very scary, they are not life threatening. °DIAGNOSIS  °Panic attacks are diagnosed through an assessment by your health care provider. Your health care provider will ask questions about your symptoms, such as where and when they occurred. Your health care provider will also ask about your medical history and use of alcohol and drugs, including prescription medicines. Your health care provider may order blood tests or other studies to rule out a serious medical condition. Your health care provider may refer you to a mental health professional for further evaluation. °TREATMENT  °· Most healthy people who have one or two panic attacks in an extreme, life-threatening situation will not require treatment. °· The treatment for panic attacks associated with anxiety disorders or other mental illness typically involves counseling with a mental health professional, medicine, or a combination of both. Your health care provider will help determine what treatment is best for you. °· Panic attacks due to physical illness usually go away with treatment of the illness. If prescription medicine is causing panic attacks, talk with your health care provider about stopping the medicine, decreasing the dose, or substituting another medicine. °· Panic attacks due to alcohol or drug abuse go away with abstinence. Some adults need professional help in order to stop drinking or using drugs. °HOME CARE INSTRUCTIONS  °·   Take all medicines as directed by your health care provider.   °· Schedule and attend follow-up visits as directed by your health care  provider. It is important to keep all your appointments. °SEEK MEDICAL CARE IF: °· You are not able to take your medicines as prescribed. °· Your symptoms do not improve or get worse. °SEEK IMMEDIATE MEDICAL CARE IF:  °· You experience panic attack symptoms that are different than your usual symptoms. °· You have serious thoughts about hurting yourself or others. °· You are taking medicine for panic attacks and have a serious side effect. °MAKE SURE YOU: °· Understand these instructions. °· Will watch your condition. °· Will get help right away if you are not doing well or get worse. °Document Released: 12/16/2005 Document Revised: 12/21/2013 Document Reviewed: 07/30/2013 °ExitCare® Patient Information ©2015 [employer]. This information is not intended to replace advice given to you by your health care provider. Make sure you discuss any questions you have with your health care provider. ° ° °Emergency Department Resource Guide °1) Find a Doctor and Pay Out of Pocket °Although you won't have to find out who is covered by your insurance plan, it is a good idea to ask around and get recommendations. You will then need to call the office and see if the doctor you have chosen will accept you as a new patient and what types of options they offer for patients who are self-pay. Some doctors offer discounts or will set up payment plans for their patients who do not have insurance, but you will need to ask so you aren't surprised when you get to your appointment. ° °2) Contact Your Local Health Department °Not all health departments have doctors that can see patients for sick visits, but many do, so it is worth a call to see if yours does. If you don't know where your local health department is, you can check in your phone book. The CDC also has a tool to help you locate your state's health department, and many state websites also have listings of all of their local health departments. ° °3) Find a Walk-in Clinic °If your  illness is not likely to be very severe or complicated, you may want to try a walk in clinic. These are popping up all over the country in pharmacies, drugstores, and shopping centers. They're usually staffed by nurse practitioners or physician assistants that have been trained to treat common illnesses and complaints. They're usually fairly quick and inexpensive. However, if you have serious medical issues or chronic medical problems, these are probably not your best option. ° °No Primary Care Doctor: °- Call [facility] at  832-8000 - they can help you locate a primary care doctor that  accepts your insurance, provides certain services, etc. °- Physician Referral Service- 1-800-533-3463 ° °Chronic Pain Problems: °Organization         Address  Phone   Notes  °[facility]  (336) 297-2271 Patients need to be referred by their primary care doctor.  ° °Medication Assistance: °Organization         Address  Phone   Notes  °[place] [facility] [address] °[place], [place] [address] (336) 641-8030 --Must be a resident of [place] °-- Must have NO insurance coverage whatsoever (no Medicaid/ Medicare, etc.) °-- The pt. MUST have a primary care doctor that directs their care regularly and follows them in the community °  °[facility]  (866) 331-1348   °  [employer]  (888) 892-1162   ° °Agencies that provide inexpensive medical care: °Organization         Address  Phone   Notes  °[facility]  (336) 832-8035   °[facility]    (336) 832-7272   °[facility] [address] °[place], [place] [address] (336) 832-4777   °[facility] [address], °[place] (336) 271-4999   °[facility]    (336) 373-0678   °[facility]    (336) 272-1050   °[facility] ° [address], [place] Phone:  (336) 832-4444, Fax:  (336) 832-4440 Hours of Operation:   9 am - 6 pm, M-F.  Also accepts Medicaid/Medicare and self-pay.  °[facility] ° [address], [place] Phone: (336) 832-3150, Fax: (336) [phone]. Hours of Operation:  8:30 am - 5:30 pm, M-F.  Also accepts Medicaid and self-pay.  °[facility] [place] [address], [place] Phone: (336) 878-6027   °[facility] [address], [place], [place] (336)723-1848, Ext. 123 Mondays & Thursdays: 7-9 AM.  First 15 patients are seen on a first come, first serve basis. °  ° °Medicaid-accepting [place] Providers: ° °Organization         Address  Phone   Notes  °[facility] [address], [place] (336) 641-2100 Also accepts self-pay patients.  °[facility] [address], [place] ° (336) 856-9996   °[facility] [address], [place] (336) 288-8857   °[facility] [address] [place] [address], [place] (336) 299-7000   °Veita Bland [address], [place]  ° (336) 373-1557 Only accepts [place] Access Medicaid patients after they have their name applied to their card.  ° °Self-Pay (no insurance) in [place]: ° °Organization         Address  Phone   Notes  °Sickle Cell Patients, [facility] [address], [place] (336) 832-1970   °[facility] [address], [place] (336) 832-4400   °[facility] ° [address], [place] (336) 992-4800   °[facility]/Dr. Osei-Bonsu ° [address] [place] [address], [place] or [address], [place] (336) [phone] Phone number for both [place] and [place] locations is the same.  °[facility] [address], [place] (336) 299-0000   °[facility] [address] [place] [address], [place] or [address] (336) 852-7530 °(336) 878-2260   °[facility] [address],  (336) 350-1642, phone; (336) 294-5005, fax Sees patients 1st and 3rd Saturday of every month.  Must not qualify for public or private insurance (i.e. Medicaid, Medicare, [place] Health Choice, Veterans' Benefits) • Household income should be no more than 200% of the poverty level •The clinic cannot treat you if you are pregnant or think you are pregnant • Sexually transmitted diseases are not treated at the clinic.  ° ° °Dental Care: °Organization         Address  Phone  Notes  °[facility] [address],  (336) 641-6152 Accepts children up to age 21 who are enrolled in Medicaid or [place] Health Choice; pregnant women with a Medicaid card;   and children who have applied for Medicaid or [place] Health Choice, but were declined, whose parents can pay a reduced fee at time of service.  °[facility] [place]  [address], [place] (336) 641-7733 Accepts children up to age 21 who are enrolled in Medicaid or [place] Health Choice; pregnant women with a Medicaid card; and children who have applied for Medicaid or [place] Health Choice, but were declined, whose parents can pay a reduced fee at time of service.  °[facility] ° [address], [place] (336) 641-4533 Patients are seen by appointment only. Walk-ins are not accepted. [facility] will see patients 18 years of age and older. °Monday - Tuesday (8am-5pm) °Most Wednesdays (8:30-5pm) °$30 per visit, cash only  °[facility] ° [address], [place] (336) 641-4533 Patients are seen by appointment only. Walk-ins are not accepted. [facility] will see patients 18 years of age and older. °One Wednesday Evening (Monthly: Volunteer Based).  $30 per visit, cash only  °[facility]  (919) [phone] for adults; Children under age 4, call Graduate Pediatric Dentistry at  ([phone]) [phone]. Children aged 4-14, please call ([phone]) [phone] to request a pediatric application. ° Dental services are provided in all areas of dental care including fillings, crowns and bridges, complete and partial dentures, implants, gum treatment, root canals, and extractions. Preventive care is also provided. Treatment is provided to both adults and children. °Patients are selected via a lottery and there is often a waiting list. °  °[facility] [address], °[place] ° (336) 763-8833 [email or web address] °  °[facility] [address], [place], [place] (336)723-1848, Ext. 123 Second and Fourth Thursday of each month, opens at 6:30 AM; Clinic ends at 9 AM.  Patients are seen on a first-come first-served basis, and a limited number are seen during each clinic.  ° °[facility] ° [address], [place], [place] (336) 723-7904   Eligibility Requirements °You must have lived in [place], [place], or [place] counties for at least the last three months. °  You cannot be eligible for state or federal sponsored healthcare insurance, including Veterans Administration, Medicaid, or Medicare. °  You generally cannot be eligible for healthcare insurance through your employer.  °  How to apply: °Eligibility screenings are held every Tuesday and Wednesday afternoon from 1:00 pm until 4:00 pm. You do not need an appointment for the interview!  °[facility] [address], [place], [place] 336-631-2330   °[facility]  336-342-8273   °[facility]  336-703-3100   °[facility]  336-570-6415   ° °Behavioral Health Resources in the Community: °Intensive Outpatient Programs °Organization         Address  Phone  Notes  °[facility] [address], [place], [place] 336-878-6098   °[facility] [address], [place], [place] 336-832-9800   °[facility] [address], [place], [place] ° 336-882-2125   °[facility] [address],  °[place], [place] 1-800-853-5163 or 336-641-4981   °Substance Abuse Resources °Organization         Address  Phone  Notes  °[facility]  336-882-2125   °[facility]  336-784-9470   °[facility]  336-285-9073   °[facility]  336-845-3988   °  Residential & Outpatient Substance Abuse Program  1-800-659-3381   °Psychological Services °Organization         Address  Phone  Notes  °[facility]  336- 832-9600   °[facility]  336- 378-7881   °[facility] [address], [place] 1-800-853-5163 or 336-641-4981   ° °Mobile Crisis Teams °Organization         Address  Phone  Notes  °[facility], Mobile Crisis Care Unit  1-877-626-1772   °Assertive °[facility] ° [address] [place], [place] 336-834-9664   °Sharon DeEsch [address] °[place] 336-554-5454   ° °Self-Help/Support Groups °Organization         Address  Phone             Notes  °[facility] - variety of support groups  336- 373-1402 Call for more information  °Narcotics Anonymous (NA), [facility] [address], °[place]  2 meetings at this location  ° °Residential Treatment Programs °Organization         Address  Phone  Notes  °[facility] [address],    °[place]  1-866-801-8205   °[facility] ° [address], [place], [place] 704-293-8524   °[facility] [address], [place] 336-845-3988 Admissions: 8am-3pm M-F  °[facility] [address]    °[place], [place] 336-841-1104   °[facility] [address], [place], [place] 336-379-7146   °[facility] [address]  °[place], [place] 336-285-9073   °[facility] Intensive Outpatient [address], [place], [place]  336-852-3033   °[facility] [address]  °[place], [place] 1-877-615-2722 or 336-784-9470   °[facility] [address] [place], [place] 336-227-7417 Accepts Medicaid  °[facility] [address]  ° [place] 1-800-659-3381 Substance Abuse/Addiction Treatment  ° °[place] Behavioral Health Resources °Organization         Address  Phone  Notes  °[facility]  (888) 581-9988   °Julie Brannon, PhD [address] [place], [place]   (336) 349-5553 or (336) 951-0000   °[facility]   [address] °[place], [place] (336) 349-4454   °[facility] [address], [place], [place] (336) 342-8316 Insurance/Medicaid/sponsorship through [facility]  °[facility] [address]                                    [place], [place] (336) 342-8316 Therapy/tele-psych/case  °[facility] [address]  ° [place], [place] (336) 349-2233    °Dr. Arfeen  (336) 349-4544   °[facility] [place]  [employer] [place] [facility] 1) [address],  °2) [address], [place] °3)  [address], [place] (336) 349-3220 °(336) 342-7768 ° °(336) 342-8140   °[place] [facility] (336) 342-1394 or (336) 342-3537 (After Hours)    ° ° ° °
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
## 2015-09-11 NOTE — ED Notes (Signed)
Discharge instructions/prescription reviewed with patient/spouse. Patient angry/upset that prescription for Vistaril. Requested same medications as received during last ED visit on 09/06/15. Discussed with Dr. Elesa Massed. No further orders received. Patient discharged. No acute distress noted at time of discharge. Patient declined wheelchair at discharge.

## 2015-09-30 ENCOUNTER — Emergency Department (HOSPITAL_COMMUNITY): Payer: Medicaid Other

## 2015-09-30 ENCOUNTER — Emergency Department (HOSPITAL_COMMUNITY)
Admission: EM | Admit: 2015-09-30 | Discharge: 2015-09-30 | Disposition: A | Discharge status: Home / Self Care | Payer: Medicaid Other | Attending: Emergency Medicine | Admitting: Emergency Medicine

## 2015-09-30 ENCOUNTER — Encounter (HOSPITAL_COMMUNITY): Payer: Self-pay | Admitting: *Deleted

## 2015-09-30 DIAGNOSIS — Z87438 Personal history of other diseases of male genital organs: Secondary | ICD-10-CM | POA: Diagnosis not present

## 2015-09-30 DIAGNOSIS — Z720 Tobacco use: Secondary | ICD-10-CM | POA: Insufficient documentation

## 2015-09-30 DIAGNOSIS — Z79899 Other long term (current) drug therapy: Secondary | ICD-10-CM | POA: Insufficient documentation

## 2015-09-30 DIAGNOSIS — F419 Anxiety disorder, unspecified: Secondary | ICD-10-CM | POA: Insufficient documentation

## 2015-09-30 DIAGNOSIS — F329 Major depressive disorder, single episode, unspecified: Secondary | ICD-10-CM | POA: Diagnosis not present

## 2015-09-30 DIAGNOSIS — M79672 Pain in left foot: Secondary | ICD-10-CM | POA: Diagnosis present

## 2015-09-30 MED ORDER — IBUPROFEN 600 MG PO TABS: 600.0000 mg | ORAL_TABLET | Freq: Four times a day (QID) | ORAL | Status: DC | PRN

## 2015-09-30 MED ORDER — IBUPROFEN 800 MG PO TABS
800.0000 mg | ORAL_TABLET | Freq: Once | ORAL | Status: AC
  Administered 2015-09-30: 800 mg via ORAL
  Filled 2015-09-30: qty 1

## 2015-09-30 MED ORDER — TRAMADOL HCL 50 MG PO TABS: 50.0000 mg | ORAL_TABLET | Freq: Four times a day (QID) | ORAL | Status: DC | PRN

## 2015-09-30 MED ORDER — TRAMADOL HCL 50 MG PO TABS
50.0000 mg | ORAL_TABLET | Freq: Once | ORAL | Status: AC
  Administered 2015-09-30: 50 mg via ORAL
  Filled 2015-09-30: qty 1

## 2015-09-30 NOTE — ED Notes (Signed)
Left foot pain times 3 days.  Denies any injury

## 2015-09-30 NOTE — Discharge Instructions (Signed)
Musculoskeletal Pain Musculoskeletal pain is muscle and boney aches and pains. These pains can occur in any part of the body. Your caregiver may treat you without knowing the cause of the pain. They may treat you if blood or urine tests, X-rays, and other tests were normal.  CAUSES There is often not a definite cause or reason for these pains. These pains may be caused by a type of germ (virus). The discomfort may also come from overuse. Overuse includes working out too hard when your body is not fit. Boney aches also come from weather changes. Bone is sensitive to atmospheric pressure changes. HOME CARE INSTRUCTIONS   Ask when your test results will be ready. Make sure you get your test results.  Only take over-the-counter or prescription medicines for pain, discomfort, or fever as directed by your caregiver. If you were given medications for your condition, do not drive, operate machinery or power tools, or sign legal documents for 24 hours. Do not drink alcohol. Do not take sleeping pills or other medications that may interfere with treatment.  Continue all activities unless the activities cause more pain. When the pain lessens, slowly resume normal activities. Gradually increase the intensity and duration of the activities or exercise.  During periods of severe pain, bed rest may be helpful. Lay or sit in any position that is comfortable.  Putting ice on the injured area.  Put ice in a bag.  Place a towel between your skin and the bag.  Leave the ice on for 15 to 20 minutes, 3 to 4 times a day.  Follow up with your caregiver for continued problems and no reason can be found for the pain. If the pain becomes worse or does not go away, it may be necessary to repeat tests or do additional testing. Your caregiver may need to look further for a possible cause. SEEK IMMEDIATE MEDICAL CARE IF:  You have pain that is getting worse and is not relieved by medications.  You develop chest pain  that is associated with shortness or breath, sweating, feeling sick to your stomach (nauseous), or throw up (vomit).  Your pain becomes localized to the abdomen.  You develop any new symptoms that seem different or that concern you. MAKE SURE YOU:   Understand these instructions.  Will watch your condition.  Will get help right away if you are not doing well or get worse. Document Released: 12/16/2005 Document Revised: 03/09/2012 Document Reviewed: 08/20/2013 [facility] Patient Information 2015 [place], [place]. This information is not intended to replace advice given to you by your health care provider. Make sure you discuss any questions you have with your health care provider.   It is possible you have acute tendonitis in your foot since your pain is worsened with toe flexion and extension as discussed.  Ice and elevation is recommended for the next 2 days as much as is comfortable.  Use the medicines prescribed.  Call Dr Romeo Apple for further evaluation if your symptoms persist or worsen.  Your xrays are negative today for any acute injury or stress fracture.

## 2015-09-30 NOTE — ED Notes (Signed)
Discharge instructions and presecriptions reviewed with pt - Discussed pain med and no pharmacutical pain relief methods -Ambulated off unit independently

## 2015-10-03 NOTE — ED Provider Notes (Signed)
CSN: [id number]     Arrival date & time 09/30/15  1157 History   First MD Initiated Contact with Patient 09/30/15 1218     Chief Complaint  Patient presents with  . Foot Pain     (Consider location/radiation/quality/duration/timing/severity/associated sxs/prior Treatment) Patient is a 39 y.o. [name] presenting with lower extremity pain. The history is provided by the patient.  Foot Pain This is a new (Pt denies injury.) problem. The current episode started in the past 7 days. The problem occurs constantly. The problem has been unchanged. Associated symptoms include arthralgias. Pertinent negatives include no fever, joint swelling, myalgias, numbness, rash or weakness. Exacerbated by: Flexion and extension of toes increased pain. He has tried acetaminophen and NSAIDs for the symptoms. The treatment provided no relief.    Past Medical History  Diagnosis Date  . Testicular torsion   . Depression   . Anxiety    Past Surgical History  Procedure Laterality Date  . Appendectomy    . Vasectomy    . Eye surgery    . Nasal sinus surgery     History reviewed. No pertinent family history. Social History  Substance Use Topics  . Smoking status: Current Every Day Smoker  . Smokeless tobacco: None  . Alcohol Use: No    Review of Systems  Constitutional: Negative for fever.  Musculoskeletal: Positive for arthralgias. Negative for myalgias and joint swelling.  Skin: Negative for color change and rash.  Neurological: Negative for weakness and numbness.      Allergies  Review of patient's allergies indicates no known allergies.  Home Medications   Prior to Admission medications   Medication Sig Start Date End Date Taking? Authorizing Provider  Multiple Vitamins-Minerals (CENTRUM ADULTS PO) Take 1 tablet by mouth daily.   Yes Historical Provider, MD  cloNIDine (CATAPRES) 0.1 MG tablet Take 1 tablet (0.1 mg total) by mouth 2 (two) times daily. Patient not taking: Reported on 09/30/2015  09/03/15   [name], MD  cloNIDine (CATAPRES) 0.1 MG tablet Take 1 tablet (0.1 mg total) by mouth 2 (two) times daily. Patient not taking: Reported on 09/30/2015 09/06/15   [name], DO  dicyclomine (BENTYL) 20 MG tablet Take 1 tablet (20 mg total) by mouth 2 (two) times daily. Patient not taking: Reported on 09/30/2015 09/06/15   [name], DO  hydrOXYzine (ATARAX/VISTARIL) John MG tablet Take 1 tablet (John mg total) by mouth every 6 (six) hours as needed for anxiety. Patient not taking: Reported on 09/30/2015 09/06/15   [name], DO  hydrOXYzine (ATARAX/VISTARIL) John MG tablet Take 1 tablet (John mg total) by mouth every 6 (six) hours as needed for anxiety. Patient not taking: Reported on 09/30/2015 09/11/15   [name], DO  ibuprofen (ADVIL,MOTRIN) 600 MG tablet Take 1 tablet (600 mg total) by mouth every 6 (six) hours as needed. 09/30/15   [name], PA-C  ondansetron (ZOFRAN ODT) 4 MG disintegrating tablet Take 1 tablet (4 mg total) by mouth every 8 (eight) hours as needed for nausea or vomiting. Patient not taking: Reported on 09/30/2015 09/06/15   [name], DO  ondansetron (ZOFRAN ODT) 8 MG disintegrating tablet Take 1 tablet (8 mg total) by mouth every 8 (eight) hours as needed for refractory nausea / vomiting.  ODT q4 hours prn nausea Patient not taking: Reported on 09/30/2015 09/03/15   [name], MD  promethazine (PHENERGAN) John MG tablet Take 1 tablet (John mg total) by mouth every 8 (eight) hours as  needed for nausea or vomiting. Patient not taking: Reported on 09/30/2015 09/03/15   [name], MD  traMADol (ULTRAM) 50 MG tablet Take 1 tablet (50 mg total) by mouth every 6 (six) hours as needed. 09/30/15   [name], PA-C   BP 147/86 mmHg  Pulse 59  Temp(Src) 98.2 F (36.8 C) (Oral)  Resp 18  Ht  (1.778 m)  Wt 200 lb (90.719 kg)  BMI 28.70 kg/m2  SpO2 99% Physical Exam  Constitutional: He appears well-developed and well-nourished.  HENT:  Head:  Atraumatic.  Neck: Normal range of motion.  Cardiovascular:  Pulses equal bilaterally  Musculoskeletal: He exhibits tenderness.       Left foot: There is tenderness. There is no swelling, normal capillary refill, no crepitus and no deformity.       Feet:  ttp left lateral dorsal foot. Mild lateral edema, no erythema, no bruising, dorsalis pedal pulse full with less than 2 sec cap refill in toes. No rash, achilles intact.  Calf soft and nontender.  Neurological: He is alert. He has normal strength. He displays normal reflexes. No sensory deficit.  Skin: Skin is warm and dry.  Psychiatric: He has a normal mood and affect.    ED Course  Procedures (including critical care time) Labs Review Labs Reviewed - No data to display  Imaging Review No results found. I have personally reviewed and evaluated these images and lab results as part of my medical decision-making.   EKG Interpretation None      MDM   Final diagnoses:  Foot pain, left    Radiological studies were viewed, interpreted and considered during the medical decision making and disposition process. I agree with radiologists reading.  Results were also discussed with patient.  Suspect probable tendonitis, xrays negative for stress fx.  He was advised ice tx x 2 days, may add heat on day 3.  Ibuprofen, tramadol, referral to Dr. Romeo Apple if not improving over the next week.     [name], PA-C 10/03/15 2128  Bethann Berkshire, MD 10/04/15 [phone]

## 2015-12-11 ENCOUNTER — Encounter (HOSPITAL_COMMUNITY): Payer: Self-pay | Admitting: *Deleted

## 2015-12-11 DIAGNOSIS — F17200 Nicotine dependence, unspecified, uncomplicated: Secondary | ICD-10-CM | POA: Diagnosis not present

## 2015-12-11 DIAGNOSIS — R1030 Lower abdominal pain, unspecified: Secondary | ICD-10-CM | POA: Diagnosis not present

## 2015-12-11 NOTE — ED Notes (Addendum)
Pt c/o left side testicle pain that radiates to lower abd pain that started today, pt reports that the pain has been intermittent and changes with positions,

## 2015-12-12 ENCOUNTER — Emergency Department (HOSPITAL_COMMUNITY)
Admission: EM | Admit: 2015-12-12 | Discharge: 2015-12-12 | Disposition: A | Discharge status: Home / Self Care | Payer: Medicaid Other | Attending: Emergency Medicine | Admitting: Emergency Medicine

## 2015-12-12 NOTE — ED Notes (Signed)
Dr Fayrene FearingJames notified of pt in department and complaint, no additional orders given,

## 2015-12-12 NOTE — ED Notes (Signed)
Registration states pt was seen leaving the facility

## 2016-03-13 ENCOUNTER — Ambulatory Visit (INDEPENDENT_AMBULATORY_CARE_PROVIDER_SITE_OTHER): Payer: Medicaid Other | Admitting: Urology

## 2016-03-13 DIAGNOSIS — K5909 Other constipation: Secondary | ICD-10-CM | POA: Diagnosis not present

## 2016-03-13 DIAGNOSIS — R102 Pelvic and perineal pain: Secondary | ICD-10-CM

## 2016-03-13 DIAGNOSIS — M6289 Other specified disorders of muscle: Secondary | ICD-10-CM

## 2016-03-15 ENCOUNTER — Other Ambulatory Visit: Payer: Self-pay | Admitting: Urology

## 2016-03-15 DIAGNOSIS — M6289 Other specified disorders of muscle: Secondary | ICD-10-CM

## 2016-03-21 ENCOUNTER — Other Ambulatory Visit (HOSPITAL_COMMUNITY): Payer: Self-pay

## 2016-03-21 ENCOUNTER — Ambulatory Visit (HOSPITAL_COMMUNITY): Admission: RE | Admit: 2016-03-21 | Payer: Medicaid Other | Source: Ambulatory Visit

## 2016-04-02 ENCOUNTER — Encounter (INDEPENDENT_AMBULATORY_CARE_PROVIDER_SITE_OTHER): Payer: Self-pay

## 2016-04-09 ENCOUNTER — Encounter (HOSPITAL_COMMUNITY): Payer: Self-pay | Admitting: Emergency Medicine

## 2016-04-09 ENCOUNTER — Emergency Department (HOSPITAL_COMMUNITY)
Admission: EM | Admit: 2016-04-09 | Discharge: 2016-04-10 | Disposition: A | Discharge status: Home / Self Care | Payer: Medicaid Other | Source: Home / Self Care | Attending: Emergency Medicine | Admitting: Emergency Medicine

## 2016-04-09 ENCOUNTER — Emergency Department (HOSPITAL_COMMUNITY): Payer: Medicaid Other

## 2016-04-09 DIAGNOSIS — J189 Pneumonia, unspecified organism: Secondary | ICD-10-CM | POA: Insufficient documentation

## 2016-04-09 DIAGNOSIS — F17200 Nicotine dependence, unspecified, uncomplicated: Secondary | ICD-10-CM | POA: Insufficient documentation

## 2016-04-09 DIAGNOSIS — Z79899 Other long term (current) drug therapy: Secondary | ICD-10-CM | POA: Insufficient documentation

## 2016-04-09 DIAGNOSIS — F329 Major depressive disorder, single episode, unspecified: Secondary | ICD-10-CM

## 2016-04-09 LAB — BASIC METABOLIC PANEL
Anion gap: 11 (ref 5–15)
BUN: 12 mg/dL (ref 6–20)
CO2: 22 mmol/L (ref 22–32)
Calcium: 8 mg/dL — ABNORMAL LOW (ref 8.9–10.3)
Chloride: 102 mmol/L (ref 101–111)
Creatinine, Ser: 1.21 mg/dL (ref 0.61–1.24)
GFR calc Af Amer: >60 mL/min (ref >60)
GFR calc non Af Amer: >60 mL/min (ref >60)
Glucose, Bld: 129 mg/dL — ABNORMAL HIGH (ref 65–99)
Potassium: 3.6 mmol/L (ref 3.5–5.1)
Sodium: 135 mmol/L (ref 135–145)

## 2016-04-09 LAB — CBC
HCT: 37.6 % — ABNORMAL LOW (ref 39.0–52.0)
Hemoglobin: 13.1 g/dL (ref 13.0–17.0)
MCH: 30 pg (ref 26.0–34.0)
MCHC: 34.8 g/dL (ref 30.0–36.0)
MCV: 86.2 fL (ref 78.0–100.0)
Platelets: 228 10*3/uL (ref 150–400)
RBC: 4.36 MIL/uL (ref 4.22–5.81)
RDW: 13.3 % (ref 11.5–15.5)
WBC: 19.1 10*3/uL — ABNORMAL HIGH (ref 4.0–10.5)

## 2016-04-09 LAB — TROPONIN I: Troponin I: <0.03 ng/mL (ref <0.031)

## 2016-04-09 MED ORDER — IPRATROPIUM-ALBUTEROL 0.5-2.5 (3) MG/3ML IN SOLN
3.0000 mL | Freq: Once | RESPIRATORY_TRACT | Status: AC
  Administered 2016-04-09: 3 mL via RESPIRATORY_TRACT
  Filled 2016-04-09: qty 3

## 2016-04-09 NOTE — ED Notes (Signed)
Patient complaining of left sided chest pain with shortness of breath starting upon awakening today. Also complaining of dry cough today. States "the cough is so bad it makes my head hurt."

## 2016-04-09 NOTE — ED Provider Notes (Signed)
CSN: [id number]     Arrival date & time 04/09/16  2201 History  By signing my name below, I, Iona Beard, attest that this documentation has been prepared under the direction and in the presence of Raeford Razor, MD.   Electronically Signed: Iona Beard, ED Scribe. 04/09/2016. 10:33 PM     Chief Complaint  Patient presents with  . Chest Pain  . Shortness of Breath    The history is provided by the patient. No language interpreter was used.   HPI Comments: John [name] is a 40 y.o. male who presents to the Emergency Department complaining of gradual onset, shortness of breath, onset upon awakening today. Pt reports associated left arm numbness, left sided chest pain, cough, wheezing, headache, and subjective fever. Pt reports he gets bronchitis every year around this time. No other associated symptoms noted. No other worsening or alleviating factors noted. Pt denies leg swelling, leg pain, hx of lung problems, or any other pertinent symptoms. Pt is current smoker. Pt uses albuterol inhaler at home 3x/day. He also complains of burn to his right index finger, occuring about a week ago when he burned the area with chainsaw exhaust.   Past Medical History  Diagnosis Date  . Testicular torsion   . Depression   . Anxiety    Past Surgical History  Procedure Laterality Date  . Appendectomy    . Vasectomy    . Eye surgery    . Nasal sinus surgery     History reviewed. No pertinent family history. Social History  Substance Use Topics  . Smoking status: Current Every Day Smoker  . Smokeless tobacco: None  . Alcohol Use: No    Review of Systems A complete 10 system review of systems was obtained and all systems are negative except as noted in the HPI and PMH.    Allergies  Review of patient's allergies indicates no known allergies.  Home Medications   Prior to Admission medications   Medication Sig Start Date End Date Taking? Authorizing Provider  cloNIDine (CATAPRES)  0.1 MG tablet Take 1 tablet (0.1 mg total) by mouth 2 (two) times daily. Patient not taking: Reported on 09/30/2015 09/03/15   [name], MD  cloNIDine (CATAPRES) 0.1 MG tablet Take 1 tablet (0.1 mg total) by mouth 2 (two) times daily. Patient not taking: Reported on 09/30/2015 09/06/15   [name], DO  dicyclomine (BENTYL) 20 MG tablet Take 1 tablet (20 mg total) by mouth 2 (two) times daily. Patient not taking: Reported on 09/30/2015 09/06/15   [name], DO  hydrOXYzine (ATARAX/VISTARIL) 25 MG tablet Take 1 tablet (25 mg total) by mouth every 6 (six) hours as needed for anxiety. Patient not taking: Reported on 09/30/2015 09/06/15   [name], DO  hydrOXYzine (ATARAX/VISTARIL) 25 MG tablet Take 1 tablet (25 mg total) by mouth every 6 (six) hours as needed for anxiety. Patient not taking: Reported on 09/30/2015 09/11/15   [name], DO  ibuprofen (ADVIL,MOTRIN) 600 MG tablet Take 1 tablet (600 mg total) by mouth every 6 (six) hours as needed. 09/30/15   [name], PA-C  Multiple Vitamins-Minerals (CENTRUM ADULTS PO) Take 1 tablet by mouth daily.    Historical Provider, MD  ondansetron (ZOFRAN ODT) 4 MG disintegrating tablet Take 1 tablet (4 mg total) by mouth every 8 (eight) hours as needed for nausea or vomiting. Patient not taking: Reported on 09/30/2015 09/06/15   [name], DO  ondansetron (ZOFRAN ODT) 8 MG disintegrating  tablet Take 1 tablet (8 mg total) by mouth every 8 (eight) hours as needed for refractory nausea / vomiting.  ODT q4 hours prn nausea Patient not taking: Reported on 09/30/2015 09/03/15   [name], MD  promethazine (PHENERGAN) 25 MG tablet Take 1 tablet (25 mg total) by mouth every 8 (eight) hours as needed for nausea or vomiting. Patient not taking: Reported on 09/30/2015 09/03/15   [name], MD  traMADol (ULTRAM) 50 MG tablet Take 1 tablet (50 mg total) by mouth every 6 (six) hours as needed. 09/30/15   [name], PA-C   BP 141/74 mmHg  Pulse 110   Temp(Src) 100.4 F (38 C) (Oral)  Resp 26  Ht  (1.778 m)  Wt 230 lb (104.327 kg)  BMI 33.00 kg/m2  SpO2 95% Physical Exam  Constitutional: He appears well-developed and well-nourished. No distress.  HENT:  Head: Normocephalic and atraumatic.  Eyes: Conjunctivae and EOM are normal.  Neck: Neck supple. No tracheal deviation present.  Cardiovascular: Normal rate.   Pulmonary/Chest: Effort normal. No respiratory distress. He has wheezes. He has no rales.  Mild expiratory wheezing.   Musculoskeletal: Normal range of motion.  Neurological: He is alert.  Skin: Skin is warm and dry.  Psychiatric: He has a normal mood and affect. His behavior is normal.    ED Course  Procedures (including critical care time) DIAGNOSTIC STUDIES: Oxygen Saturation is 95% on RA, adequate by my interpretation.    COORDINATION OF CARE: 10:48 PM-Discussed treatment plan which includes CXR, EKG, troponin I, CBC, and BMP with pt at bedside and pt agreed to plan.   Labs Review Labs Reviewed  CBC - Abnormal; Notable for the following:    WBC 19.1 (*)    HCT 37.6 (*)    All other components within normal limits  BASIC METABOLIC PANEL - Abnormal; Notable for the following:    Glucose, Bld 129 (*)    Calcium 8.0 (*)    All other components within normal limits  TROPONIN I    Imaging Review No results found.   Dg Chest 2 View  04/09/2016  CLINICAL DATA:  Shortness of breath for 1 day. Left-sided chest pain EXAM: CHEST  2 VIEW COMPARISON:  None. FINDINGS: There is patchy infiltrate in the right middle lobe. Lungs elsewhere clear. Heart size and pulmonary vascularity are normal. No adenopathy. No bone lesions. IMPRESSION: Patchy right middle lobe infiltrate consistent with pneumonia. Lungs elsewhere clear. Cardiac silhouette within normal limits. Followup PA and lateral chest radiographs recommended in 3-4 weeks following trial of antibiotic therapy to ensure resolution and exclude underlying  malignancy. Electronically Signed   By: Bretta Bang III M.D.   On: 04/09/2016 23:33   Dg Chest Port 1 View  04/10/2016  CLINICAL DATA:  Shortness of breath and left-sided chest pain. Fever EXAM: PORTABLE CHEST 1 VIEW COMPARISON:  Yesterday FINDINGS: Progressively confluent bilateral patchy airspace disease. No cavitation or effusion is seen. Normal heart size and mediastinal contours accounting for low lung volumes. IMPRESSION: Progressive bilateral pneumonia. Electronically Signed   By: Marnee Spring M.D.   On: 04/10/2016 12:46   I have personally reviewed and evaluated these images and lab results as part of my medical decision-making.   EKG Interpretation   Date/Time:  Tuesday April 09 2016 22:08:06 EDT Ventricular Rate:  102 PR Interval:  141 QRS Duration: 92 QT Interval:  329 QTC Calculation: 428 R Axis:   29 Text Interpretation:  Sinus tachycardia Baseline wander in lead(s)  I ED  PHYSICIAN INTERPRETATION AVAILABLE IN [facility] Confirmed by TEST,  Record ([id number]) on 04/10/2016 7:16:16 AM      MDM   Final diagnoses:  CAP (community acquired pneumonia)    40yM with CAP. Given IM rocephin and zithromax. I feel he is appropriate for further outpt tx. It has been determined that no acute conditions requiring further emergency intervention are present at this time. The patient has been advised of the diagnosis and plan. I reviewed any labs and imaging including any potential incidental findings. We have discussed signs and symptoms that warrant return to the ED and they are listed in the discharge instructions.     Raeford RazorStephen Chidera Thivierge, MD 04/17/16 1450

## 2016-04-10 ENCOUNTER — Inpatient Hospital Stay (HOSPITAL_COMMUNITY)
Admission: EM | Admit: 2016-04-10 | Discharge: 2016-04-12 | DRG: 195 | Disposition: A | Discharge status: Home / Self Care | Payer: Medicaid Other | Attending: Internal Medicine | Admitting: Internal Medicine

## 2016-04-10 ENCOUNTER — Encounter (HOSPITAL_COMMUNITY): Payer: Self-pay

## 2016-04-10 ENCOUNTER — Emergency Department (HOSPITAL_COMMUNITY): Payer: Medicaid Other

## 2016-04-10 DIAGNOSIS — Z23 Encounter for immunization: Secondary | ICD-10-CM

## 2016-04-10 DIAGNOSIS — J189 Pneumonia, unspecified organism: Principal | ICD-10-CM | POA: Diagnosis present

## 2016-04-10 DIAGNOSIS — F17200 Nicotine dependence, unspecified, uncomplicated: Secondary | ICD-10-CM | POA: Diagnosis present

## 2016-04-10 DIAGNOSIS — F329 Major depressive disorder, single episode, unspecified: Secondary | ICD-10-CM | POA: Diagnosis present

## 2016-04-10 DIAGNOSIS — F419 Anxiety disorder, unspecified: Secondary | ICD-10-CM | POA: Diagnosis present

## 2016-04-10 DIAGNOSIS — R0902 Hypoxemia: Secondary | ICD-10-CM

## 2016-04-10 MED ORDER — LIDOCAINE HCL (PF) 1 % IJ SOLN
INTRAMUSCULAR | Status: AC
  Administered 2016-04-10: 2.1 mL
  Filled 2016-04-10: qty 5

## 2016-04-10 MED ORDER — PNEUMOCOCCAL VAC POLYVALENT 25 MCG/0.5ML IJ INJ
0.5000 mL | INJECTION | INTRAMUSCULAR | Status: AC | PRN
  Administered 2016-04-12: 0.5 mL via INTRAMUSCULAR
  Filled 2016-04-10: qty 0.5

## 2016-04-10 MED ORDER — BENZONATATE 100 MG PO CAPS: 100.0000 mg | ORAL_CAPSULE | Freq: Three times a day (TID) | ORAL | Status: DC | PRN

## 2016-04-10 MED ORDER — AZITHROMYCIN 250 MG PO TABS: 250.0000 mg | ORAL_TABLET | Freq: Every day | ORAL | Status: DC

## 2016-04-10 MED ORDER — DEXTROSE 5 % IV SOLN
500.0000 mg | Freq: Once | INTRAVENOUS | Status: AC
  Administered 2016-04-10: 500 mg via INTRAVENOUS
  Filled 2016-04-10: qty 500

## 2016-04-10 MED ORDER — CETYLPYRIDINIUM CHLORIDE 0.05 % MT LIQD
7.0000 mL | Freq: Two times a day (BID) | OROMUCOSAL | Status: DC
  Administered 2016-04-10 – 2016-04-11 (×3): 7 mL via OROMUCOSAL

## 2016-04-10 MED ORDER — SODIUM CHLORIDE 0.9 % IV BOLUS (SEPSIS)
500.0000 mL | Freq: Once | INTRAVENOUS | Status: AC
  Administered 2016-04-10: 500 mL via INTRAVENOUS

## 2016-04-10 MED ORDER — ONDANSETRON HCL 4 MG/2ML IJ SOLN: 4.0000 mg | Freq: Four times a day (QID) | INTRAMUSCULAR | Status: DC | PRN

## 2016-04-10 MED ORDER — CEFTRIAXONE SODIUM 1 G IJ SOLR
1.0000 g | Freq: Once | INTRAMUSCULAR | Status: AC
  Administered 2016-04-10: 1 g via INTRAMUSCULAR
  Filled 2016-04-10: qty 10

## 2016-04-10 MED ORDER — DEXTROSE 5 % IV SOLN
1.0000 g | Freq: Once | INTRAVENOUS | Status: AC
  Administered 2016-04-10: 1 g via INTRAVENOUS
  Filled 2016-04-10: qty 10

## 2016-04-10 MED ORDER — IBUPROFEN 400 MG PO TABS
600.0000 mg | ORAL_TABLET | Freq: Once | ORAL | Status: AC
  Administered 2016-04-10: 600 mg via ORAL
  Filled 2016-04-10: qty 2

## 2016-04-10 MED ORDER — DIAZEPAM 5 MG PO TABS: 5.0000 mg | ORAL_TABLET | Freq: Every day | ORAL | Status: DC | PRN

## 2016-04-10 MED ORDER — AZITHROMYCIN 250 MG PO TABS
500.0000 mg | ORAL_TABLET | Freq: Once | ORAL | Status: AC
  Administered 2016-04-10: 500 mg via ORAL
  Filled 2016-04-10: qty 2

## 2016-04-10 MED ORDER — TIZANIDINE HCL 4 MG PO TABS: 4.0000 mg | ORAL_TABLET | Freq: Four times a day (QID) | ORAL | Status: DC | PRN

## 2016-04-10 MED ORDER — DEXTROSE 5 % IV SOLN
1.0000 g | INTRAVENOUS | Status: DC
  Administered 2016-04-11 – 2016-04-12 (×2): 1 g via INTRAVENOUS
  Filled 2016-04-10 (×5): qty 10

## 2016-04-10 MED ORDER — ALBUTEROL SULFATE (2.5 MG/3ML) 0.083% IN NEBU
5.0000 mg | INHALATION_SOLUTION | Freq: Once | RESPIRATORY_TRACT | Status: AC
  Administered 2016-04-10: 5 mg via RESPIRATORY_TRACT
  Filled 2016-04-10: qty 6

## 2016-04-10 MED ORDER — SODIUM CHLORIDE 0.9 % IV SOLN
INTRAVENOUS | Status: DC
  Administered 2016-04-10 – 2016-04-11 (×4): via INTRAVENOUS

## 2016-04-10 MED ORDER — ONDANSETRON HCL 4 MG PO TABS
4.0000 mg | ORAL_TABLET | Freq: Four times a day (QID) | ORAL | Status: DC | PRN
Start: 2016-04-10 — End: 2016-04-12

## 2016-04-10 MED ORDER — BUPRENORPHINE HCL-NALOXONE HCL 8-2 MG SL SUBL
1.0000 | SUBLINGUAL_TABLET | Freq: Two times a day (BID) | SUBLINGUAL | Status: DC
  Administered 2016-04-11 – 2016-04-12 (×3): 1 via SUBLINGUAL
  Filled 2016-04-10 (×4): qty 1

## 2016-04-10 MED ORDER — ALBUTEROL SULFATE (2.5 MG/3ML) 0.083% IN NEBU
2.5000 mg | INHALATION_SOLUTION | RESPIRATORY_TRACT | Status: DC | PRN
  Administered 2016-04-10 – 2016-04-11 (×3): 2.5 mg via RESPIRATORY_TRACT
  Filled 2016-04-10 (×3): qty 3

## 2016-04-10 MED ORDER — ACETAMINOPHEN 325 MG PO TABS
650.0000 mg | ORAL_TABLET | Freq: Four times a day (QID) | ORAL | Status: DC | PRN
  Filled 2016-04-10: qty 2

## 2016-04-10 MED ORDER — TRAZODONE HCL 50 MG PO TABS: 200.0000 mg | ORAL_TABLET | Freq: Every evening | ORAL | Status: DC | PRN

## 2016-04-10 MED ORDER — CITALOPRAM HYDROBROMIDE 20 MG PO TABS
40.0000 mg | ORAL_TABLET | Freq: Every day | ORAL | Status: DC
  Administered 2016-04-10 – 2016-04-12 (×3): 40 mg via ORAL
  Filled 2016-04-10 (×3): qty 2

## 2016-04-10 MED ORDER — AZITHROMYCIN 500 MG IV SOLR
500.0000 mg | INTRAVENOUS | Status: DC
  Administered 2016-04-11 – 2016-04-12 (×2): 500 mg via INTRAVENOUS
  Filled 2016-04-10 (×5): qty 500

## 2016-04-10 MED ORDER — BUPRENORPHINE HCL-NALOXONE HCL 8-2 MG SL FILM
1.0000 | ORAL_FILM | Freq: Two times a day (BID) | SUBLINGUAL | Status: DC
  Filled 2016-04-10 (×5): qty 1

## 2016-04-10 MED ORDER — ACETAMINOPHEN 650 MG RE SUPP: 650.0000 mg | Freq: Four times a day (QID) | RECTAL | Status: DC | PRN

## 2016-04-10 MED ORDER — GABAPENTIN 300 MG PO CAPS
600.0000 mg | ORAL_CAPSULE | Freq: Two times a day (BID) | ORAL | Status: DC
  Administered 2016-04-10 – 2016-04-12 (×4): 600 mg via ORAL
  Filled 2016-04-10 (×4): qty 2

## 2016-04-10 NOTE — ED Notes (Signed)
Dr Le at bedside.  

## 2016-04-10 NOTE — ED Provider Notes (Addendum)
CSN: [id number]     Arrival date & time 04/10/16  1104 History  By signing my name below, I, Marica Otter, attest that this documentation has been prepared under the direction and in the presence of Vanetta Mulders, MD. Electronically Signed: Marica Otter, ED Scribe. 04/10/2016. 12:05 PM.  Chief Complaint  Patient presents with  . Shortness of Breath   The history is provided by the patient. No language interpreter was used.   PCP: No PCP Per Patient HPI Comments: Leanthony [place] is a 40 y.o. male, with PMHx noted below and who was Dx with pneumonia at the ED yesterday, who presents to the Emergency Department complaining of worsening, sudden onset SOB onset yesterday. Associated Sx include left sided chest pain, near syncope, cough, fever (last night), congestion, dizziness, generalized body aches, headache and abd pain. Pt denies chills, congestion, rhinorrhea, sore throat, visual disturbances, n/v/d, dysuria, hematuria, back pain, swelling of legs, headache, or any new rashes. Pt further denies Hx of bleeding easily/blood thinner use. Pt was treated at the ED for the same yesterday.   Past Medical History  Diagnosis Date  . Testicular torsion   . Depression   . Anxiety    Past Surgical History  Procedure Laterality Date  . Appendectomy    . Vasectomy    . Eye surgery    . Nasal sinus surgery     No family history on file. Social History  Substance Use Topics  . Smoking status: Current Every Day Smoker  . Smokeless tobacco: None  . Alcohol Use: No    Review of Systems  Constitutional: Positive for fever.  HENT: Negative for congestion, rhinorrhea and sore throat.   Eyes: Negative for visual disturbance.  Respiratory: Positive for cough and shortness of breath.   Cardiovascular: Positive for chest pain.  Gastrointestinal: Positive for abdominal pain. Negative for nausea, vomiting and diarrhea.  Genitourinary: Negative for dysuria.  Musculoskeletal: Positive for myalgias.  Negative for back pain.  Skin: Negative for rash.  Neurological: Positive for dizziness and headaches.  Hematological: Does not bruise/bleed easily.  Psychiatric/Behavioral: Negative for confusion.   Allergies  Review of patient's allergies indicates no known allergies.  Home Medications   Prior to Admission medications   Medication Sig Start Date End Date Taking? Authorizing Provider  benzonatate (TESSALON PERLES) 100 MG capsule Take 1 capsule (100 mg total) by mouth 3 (three) times daily as needed for cough. 04/10/16  Yes Raeford Razor, MD  Buprenorphine HCl-Naloxone HCl (SUBOXONE) 8-2 MG FILM Place 1 Film under the tongue 2 (two) times daily.   Yes Historical Provider, MD  citalopram (CELEXA) 40 MG tablet Take 40 mg by mouth daily. 03/31/16  Yes Historical Provider, MD  diazepam (VALIUM) 5 MG tablet Take 5 mg by mouth daily as needed for anxiety.   Yes Historical Provider, MD  gabapentin (NEURONTIN) 300 MG capsule Take 600 mg by mouth 2 (two) times daily. *May take a max of  daily. May take as needed. 04/04/16  Yes Historical Provider, MD  NUCYNTA 100 MG TABS Take 100 mg by mouth 4 (four) times daily. 03/08/16  Yes Historical Provider, MD  tiZANidine (ZANAFLEX) 4 MG tablet Take 4 mg by mouth 4 (four) times daily as needed for muscle spasms.  01/16/16  Yes Historical Provider, MD  traZODone (DESYREL) 100 MG tablet Take 200 mg by mouth at bedtime as needed for sleep.  02/27/16  Yes Historical Provider, MD  azithromycin (ZITHROMAX) 250 MG tablet Take 1 tablet (250 mg total)  by mouth daily. 1 tablet daily. Had first dose in ER. Patient not taking: Reported on 04/10/2016 04/10/16   Raeford RazorStephen Kohut, MD   Triage Vitals: BP 103/91 mmHg  Pulse 73  Temp(Src) 98 F (36.7 C) (Oral)  Resp 22  Ht 5\' 10"  (1.778 m)  Wt 230 lb (104.327 kg)  BMI 33.00 kg/m2  SpO2 95% Physical Exam  Constitutional: He is oriented to person, place, and time. He appears well-developed and well-nourished.  HENT:  Head:  Normocephalic and atraumatic.  Mouth/Throat: Mucous membranes are dry.  Eyes: EOM are normal. Pupils are equal, round, and reactive to light. No scleral icterus.  Eyes track normal    Neck: Normal range of motion.  Cardiovascular: Normal rate, regular rhythm, normal heart sounds and intact distal pulses.   Pulmonary/Chest: Effort normal and breath sounds normal. No respiratory distress.  Abdominal: Soft. He exhibits no distension. There is no tenderness.  Musculoskeletal: Normal range of motion. He exhibits no edema.  Neurological: He is alert and oriented to person, place, and time.  Skin: Skin is warm and dry.  Psychiatric: He has a normal mood and affect. Judgment normal.  Nursing note and vitals reviewed.   ED Course  Procedures (including critical care time) DIAGNOSTIC STUDIES: Oxygen Saturation is 95% on 2L/min.    COORDINATION OF CARE: 11:48 PM: Discussed treatment plan which includes imaging, labs, and EKGwith pt at bedside; patient verbalizes understanding and agrees with treatment plan.  Labs Review Labs Reviewed - No data to display  Imaging Review Dg Chest 2 View  04/09/2016  CLINICAL DATA:  Shortness of breath for 1 day. Left-sided chest pain EXAM: CHEST  2 VIEW COMPARISON:  None. FINDINGS: There is patchy infiltrate in the right middle lobe. Lungs elsewhere clear. Heart size and pulmonary vascularity are normal. No adenopathy. No bone lesions. IMPRESSION: Patchy right middle lobe infiltrate consistent with pneumonia. Lungs elsewhere clear. Cardiac silhouette within normal limits. Followup PA and lateral chest radiographs recommended in 3-4 weeks following trial of antibiotic therapy to ensure resolution and exclude underlying malignancy. Electronically Signed   By: Bretta BangWilliam  Woodruff III M.D.   On: 04/09/2016 23:33    Results for orders placed or performed during the hospital encounter of 04/09/16  Troponin I  Result Value Ref Range   Troponin I <0.03 <0.031 ng/mL   CBC  Result Value Ref Range   WBC 19.1 (H) 4.0 - 10.5 K/uL   RBC 4.36 4.22 - 5.81 MIL/uL   Hemoglobin 13.1 13.0 - 17.0 g/dL   HCT 16.137.6 (L) 09.639.0 - 04.552.0 %   MCV 86.2 78.0 - 100.0 fL   MCH 30.0 26.0 - 34.0 pg   MCHC 34.8 30.0 - 36.0 g/dL   RDW 40.913.3 81.111.5 - 91.415.5 %   Platelets 228 150 - 400 K/uL  Basic metabolic panel  Result Value Ref Range   Sodium 135 135 - 145 mmol/L   Potassium 3.6 3.5 - 5.1 mmol/L   Chloride 102 101 - 111 mmol/L   CO2 22 22 - 32 mmol/L   Glucose, Bld 129 (H) 65 - 99 mg/dL   BUN 12 6 - 20 mg/dL   Creatinine, Ser 7.821.21 0.61 - 1.24 mg/dL   Calcium 8.0 (L) 8.9 - 10.3 mg/dL   GFR calc non Af Amer >60 >60 mL/min   GFR calc Af Amer >60 >60 mL/min   Anion gap 11 5 - 15    I have personally reviewed and evaluated these images and lab results as part  of my medical decision-making.   EKG Interpretation   Date/Time:  Wednesday April 10 2016 11:11:20 EDT Ventricular Rate:  72 PR Interval:  168 QRS Duration: 96 QT Interval:  390 QTC Calculation: 427 R Axis:   45 Text Interpretation:  Sinus rhythm Confirmed by Shivani Barrantes  MD, Anjalee Cope  ([id number]) on 04/10/2016 11:13:54 AM      MDM   Final diagnoses:  CAP (community acquired pneumonia)  Hypoxia    Patient evaluated last evening diagnosis of community acquired pneumonia. Given IV antibiotics prior to discharge given prescriptions for or antibiotic. Patient returns for worsening shortness of breath. Patient here on room air desats down to below 90% into the upper 80s. Patient is not normally on oxygen. Patient will require admission. No wheezing noted. Will repeat chest x-ray. Labs were from last evening should be adequate for the admission.  Patient's vital signs not consistent with sepsis.  I personally performed the services described in this documentation, which was scribed in my presence. The recorded information has been reviewed and is accurate.      Vanetta Mulders, MD 04/10/16 1230  Vanetta Mulders,  MD 04/10/16 1230

## 2016-04-10 NOTE — ED Notes (Signed)
Report attemtped x2.

## 2016-04-10 NOTE — ED Notes (Signed)
Attempted report x1. 

## 2016-04-10 NOTE — Discharge Instructions (Signed)

## 2016-04-10 NOTE — H&P (Signed)
[facility] History and Physical  Christophr [name] ZOX:[id number] DOB: 09-22-1976    PCP:   No PCP Per Patient   Chief Complaint: SOB and lightheadedness.   HPI: John [name] is an 40 y.o. male with hx of prior IVDA and narcotic abuse, depression, anxiety, returned to the ER since being seen last night for coughs, chills, and SOB.  He denied myalgia or rigors, but had some chills.  He hadn't filled his Zithromax oral meds.  He denied ill contact, nausea, or vomiting.  In the ER, CXR showed PNA, and WBC was elevated to 18K.  He was hypoxic with oxygen sat in the 80's.  He was given IV Zithromax and IV Rocephin, and hospitalist was asked to admit him for CAP with hypoxia.   He does smoke about 2ppd, and no longer requires narcotics.  He requested that no narcotic be given to him.   Rewiew of Systems:  Constitutional: Negative for malaise, fever and chills. No significant weight loss or weight gain Eyes: Negative for eye pain, redness and discharge, diplopia, visual changes, or flashes of light. ENMT: Negative for ear pain, hoarseness, nasal congestion, sinus pressure and sore throat. No headaches; tinnitus, drooling, or problem swallowing. Cardiovascular: Negative for chest pain, palpitations, diaphoresis, dyspnea and peripheral edema. ; No orthopnea, PND Respiratory: Negative for cough, hemoptysis, wheezing and stridor. No pleuritic chestpain. Gastrointestinal: Negative for nausea, vomiting, diarrhea, constipation, abdominal pain, melena, blood in stool, hematemesis, jaundice and rectal bleeding.    Genitourinary: Negative for frequency, dysuria, incontinence,flank pain and hematuria; Musculoskeletal: Negative for back pain and neck pain. Negative for swelling and trauma.;  Skin: . Negative for pruritus, rash, abrasions, bruising and skin lesion.; ulcerations Neuro: Negative for headache, lightheadedness and neck stiffness. Negative for weakness, altered level of consciousness , altered  mental status, extremity weakness, burning feet, involuntary movement, seizure and syncope.  Psych: negative for anxiety, depression, insomnia, tearfulness, panic attacks, hallucinations, paranoia, suicidal or homicidal ideation    Past Medical History  Diagnosis Date  . Testicular torsion   . Depression   . Anxiety     Past Surgical History  Procedure Laterality Date  . Appendectomy    . Vasectomy    . Eye surgery    . Nasal sinus surgery      Medications:  HOME MEDS: Prior to Admission medications   Medication Sig Start Date End Date Taking? Authorizing Provider  benzonatate (TESSALON PERLES) 100 MG capsule Take 1 capsule (100 mg total) by mouth 3 (three) times daily as needed for cough. 04/10/16  Yes Raeford Razor, MD  Buprenorphine HCl-Naloxone HCl (SUBOXONE) 8-2 MG FILM Place 1 Film under the tongue 2 (two) times daily.   Yes Historical Provider, MD  citalopram (CELEXA) 40 MG tablet Take 40 mg by mouth daily. 03/31/16  Yes Historical Provider, MD  diazepam (VALIUM) 5 MG tablet Take 5 mg by mouth daily as needed for anxiety.   Yes Historical Provider, MD  gabapentin (NEURONTIN) 300 MG capsule Take 600 mg by mouth 2 (two) times daily. *May take a max of  daily. May take as needed. 04/04/16  Yes Historical Provider, MD  NUCYNTA 100 MG TABS Take 100 mg by mouth 4 (four) times daily. 03/08/16  Yes Historical Provider, MD  tiZANidine (ZANAFLEX) 4 MG tablet Take 4 mg by mouth 4 (four) times daily as needed for muscle spasms.  01/16/16  Yes Historical Provider, MD  traZODone (DESYREL) 100 MG tablet Take 200 mg by mouth at bedtime as needed for  sleep.  02/27/16  Yes Historical Provider, MD  azithromycin (ZITHROMAX) 250 MG tablet Take 1 tablet (250 mg total) by mouth daily. 1 tablet daily. Had first dose in ER. Patient not taking: Reported on 04/10/2016 04/10/16   Raeford Razor, MD     Allergies:  No Known Allergies  Social History:   reports that he has been smoking.  He does not  have any smokeless tobacco history on file. He reports that he does not drink alcohol or use illicit drugs.  Family History: History reviewed. No pertinent family history.   Physical Exam: Filed Vitals:   04/10/16 1114  BP: 103/91  Pulse: 73  Temp: 98 F (36.7 C)  TempSrc: Oral  Resp: 22  Height:  (1.778 m)  Weight: 104.327 kg (230 lb)  SpO2: 95%   Blood pressure 103/91, pulse 73, temperature 98 F (36.7 C), temperature source Oral, resp. rate 22, height  (1.778 m), weight 104.327 kg (230 lb), SpO2 95 %.  GEN:  Pleasant  patient lying in the stretcher in no acute distress; cooperative with exam. PSYCH:  alert and oriented x4; does not appear anxious or depressed; affect is appropriate. HEENT: Mucous membranes pink and anicteric; PERRLA; EOM intact; no cervical lymphadenopathy nor thyromegaly or carotid bruit; no JVD; There were no stridor. Neck is very supple. Breasts:: Not examined CHEST WALL: No tenderness CHEST: Normal respiration, clear to auscultation bilaterally.  HEART: Regular rate and rhythm.  There are no murmur, rub, or gallops.   BACK: No kyphosis or scoliosis; no CVA tenderness ABDOMEN: soft and non-tender; no masses, no organomegaly, normal abdominal bowel sounds; no pannus; no intertriginous candida. There is no rebound and no distention. Rectal Exam: Not done EXTREMITIES: No bone or joint deformity; age-appropriate arthropathy of the hands and knees; no edema; no ulcerations.  There is no calf tenderness. Genitalia: not examined PULSES: 2+ and symmetric SKIN: Normal hydration no rash or ulceration CNS: Cranial nerves 2-12 grossly intact no focal lateralizing neurologic deficit.  Speech is fluent; uvula elevated with phonation, facial symmetry and tongue midline. DTR are normal bilaterally, cerebella exam is intact, barbinski is negative and strengths are equaled bilaterally.  No sensory loss.   Labs on Admission:  Basic Metabolic Panel:  Recent  Labs Lab 04/09/16 2218  NA 135  K 3.6  CL 102  CO2 22  GLUCOSE 129*  BUN 12  CREATININE 1.21  CALCIUM 8.0*   CBC:  Recent Labs Lab 04/09/16 2218  WBC 19.1*  HGB 13.1  HCT 37.6*  MCV 86.2  PLT 228   Cardiac Enzymes:  Recent Labs Lab 04/09/16 2218  TROPONINI <0.03    Radiological Exams on Admission: Dg Chest 2 View  04/09/2016  CLINICAL DATA:  Shortness of breath for 1 day. Left-sided chest pain EXAM: CHEST  2 VIEW COMPARISON:  None. FINDINGS: There is patchy infiltrate in the right middle lobe. Lungs elsewhere clear. Heart size and pulmonary vascularity are normal. No adenopathy. No bone lesions. IMPRESSION: Patchy right middle lobe infiltrate consistent with pneumonia. Lungs elsewhere clear. Cardiac silhouette within normal limits. Followup PA and lateral chest radiographs recommended in 3-4 weeks following trial of antibiotic therapy to ensure resolution and exclude underlying malignancy. Electronically Signed   By: Bretta Bang III M.D.   On: 04/09/2016 23:33   Dg Chest Port 1 View  04/10/2016  CLINICAL DATA:  Shortness of breath and left-sided chest pain. Fever EXAM: PORTABLE CHEST 1 VIEW COMPARISON:  Yesterday FINDINGS: Progressively confluent bilateral patchy  airspace disease. No cavitation or effusion is seen. Normal heart size and mediastinal contours accounting for low lung volumes. IMPRESSION: Progressive bilateral pneumonia. Electronically Signed   By: Marnee SpringJonathon  Watts M.D.   On: 04/10/2016 12:46    EKG: Independently reviewed.   Assessment/Plan Present on Admission:  . CAP (community acquired pneumonia)  PLAN:   Will admit OBS for IV antibiotics to include IV Rocephin and IV Zithromax.  Continue with Tessalon pearls.  No narcotics as per patient's request.  Continue with IVF and home meds.   He is stable, full code, [facility]  Thank you and Good Day.   Other plans as per orders.  Code Status: FULL Unk LightningODE.    Authur Cubit, MD.  FACP [facility] Pager [phone] 7pm to 7am.  04/10/2016, 1:22 PM

## 2016-04-10 NOTE — ED Notes (Signed)
Pt reports was diagnosed with pneumonia yesterday and given zithromax here and was given prescription but hasn't been able to get his meds filled this morning.  Pt reports woke up feeling worsening sob and c/o abd pain and chest pain.  Says has been up for most of the night.

## 2016-04-11 DIAGNOSIS — F329 Major depressive disorder, single episode, unspecified: Secondary | ICD-10-CM | POA: Diagnosis present

## 2016-04-11 DIAGNOSIS — R0902 Hypoxemia: Secondary | ICD-10-CM | POA: Diagnosis present

## 2016-04-11 DIAGNOSIS — J189 Pneumonia, unspecified organism: Secondary | ICD-10-CM | POA: Diagnosis present

## 2016-04-11 DIAGNOSIS — F17200 Nicotine dependence, unspecified, uncomplicated: Secondary | ICD-10-CM | POA: Diagnosis present

## 2016-04-11 DIAGNOSIS — Z23 Encounter for immunization: Secondary | ICD-10-CM | POA: Diagnosis not present

## 2016-04-11 DIAGNOSIS — F419 Anxiety disorder, unspecified: Secondary | ICD-10-CM | POA: Diagnosis present

## 2016-04-11 LAB — CBC
HCT: 37.3 % — ABNORMAL LOW (ref 39.0–52.0)
HEMOGLOBIN: 12.5 g/dL — AB (ref 13.0–17.0)
MCH: 29.8 pg (ref 26.0–34.0)
MCHC: 33.5 g/dL (ref 30.0–36.0)
MCV: 88.8 fL (ref 78.0–100.0)
PLATELETS: 237 10*3/uL (ref 150–400)
RBC: 4.2 MIL/uL — ABNORMAL LOW (ref 4.22–5.81)
RDW: 13.6 % (ref 11.5–15.5)
WBC: 9.2 10*3/uL (ref 4.0–10.5)

## 2016-04-11 LAB — BASIC METABOLIC PANEL
ANION GAP: 6 (ref 5–15)
BUN: 12 mg/dL (ref 6–20)
CALCIUM: 8.2 mg/dL — AB (ref 8.9–10.3)
CO2: 27 mmol/L (ref 22–32)
CREATININE: 0.8 mg/dL (ref 0.61–1.24)
Chloride: 107 mmol/L (ref 101–111)
GFR calc non Af Amer: >60 mL/min (ref >60)
GLUCOSE: 109 mg/dL — AB (ref 65–99)
Potassium: 4.6 mmol/L (ref 3.5–5.1)
Sodium: 140 mmol/L (ref 135–145)

## 2016-04-11 MED ORDER — ALBUTEROL SULFATE (2.5 MG/3ML) 0.083% IN NEBU
2.5000 mg | INHALATION_SOLUTION | RESPIRATORY_TRACT | Status: DC | PRN
  Administered 2016-04-12: 2.5 mg via RESPIRATORY_TRACT
  Filled 2016-04-11: qty 3

## 2016-04-11 MED ORDER — IBUPROFEN 400 MG PO TABS
400.0000 mg | ORAL_TABLET | Freq: Four times a day (QID) | ORAL | Status: DC | PRN
Start: 2016-04-11 — End: 2016-04-12
  Administered 2016-04-11 – 2016-04-12 (×2): 400 mg via ORAL
  Filled 2016-04-11 (×2): qty 1

## 2016-04-11 MED ORDER — ALBUTEROL SULFATE (2.5 MG/3ML) 0.083% IN NEBU
2.5000 mg | INHALATION_SOLUTION | RESPIRATORY_TRACT | Status: DC
  Administered 2016-04-11 (×3): 2.5 mg via RESPIRATORY_TRACT
  Filled 2016-04-11 (×3): qty 3

## 2016-04-11 MED ORDER — ALBUTEROL SULFATE (2.5 MG/3ML) 0.083% IN NEBU
2.5000 mg | INHALATION_SOLUTION | Freq: Four times a day (QID) | RESPIRATORY_TRACT | Status: DC
  Filled 2016-04-11 (×2): qty 3

## 2016-04-11 NOTE — Progress Notes (Signed)
[facility] PROGRESS NOTE  Claudia [name] UJW:[id number] DOB: [birth year]    PCP:   No PCP Per Patient   HPI:  John [name] is an 40 y.o. male with hx of prior IVDA and narcotic abuse, depression, anxiety, admitted into the hospital for CAP, and was started back on IV Rocephin and Zithromax.  He has improved some, but still has significant wheezing and SOB.  He still requires oxygen supplementation. CXR showed PNA, and WBC has improved from 19K to now normal at 9K.  He was hypoxic with oxygen sat in the 80's on admission, and this has improved as well.  He does smoke about 2ppd, and hadn't smoked or requiring any narcotics. He requested that no narcotic be given to him.    Rewiew of Systems:  Constitutional: Negative for malaise, fever and chills. No significant weight loss or weight gain Eyes: Negative for eye pain, redness and discharge, diplopia, visual changes, or flashes of light. ENMT: Negative for ear pain, hoarseness, nasal congestion, sinus pressure and sore throat. No headaches; tinnitus, drooling, or problem swallowing. Cardiovascular: Negative for chest pain, palpitations, diaphoresis, dyspnea and peripheral edema. ; No orthopnea, PND Respiratory: Negative for , hemoptysis, wheezing and stridor. No pleuritic chestpain. Gastrointestinal: Negative for nausea, vomiting, diarrhea, constipation, abdominal pain, melena, blood in stool, hematemesis, jaundice and rectal bleeding.    Genitourinary: Negative for frequency, dysuria, incontinence,flank pain and hematuria; Musculoskeletal: Negative for back pain and neck pain. Negative for swelling and trauma.;  Skin: . Negative for pruritus, rash, abrasions, bruising and skin lesion.; ulcerations Neuro: Negative for headache, lightheadedness and neck stiffness. Negative for weakness, altered level of consciousness , altered mental status, extremity weakness, burning feet, involuntary movement, seizure and syncope.  Psych: negative for  anxiety, depression, insomnia, tearfulness, panic attacks, hallucinations, paranoia, suicidal or homicidal ideation    Past Medical History  Diagnosis Date  . Testicular torsion   . Depression   . Anxiety     Past Surgical History  Procedure Laterality Date  . Appendectomy    . Vasectomy    . Eye surgery    . Nasal sinus surgery      Medications:  HOME MEDS: Prior to Admission medications   Medication Sig Start Date End Date Taking? Authorizing Provider  benzonatate (TESSALON PERLES) 100 MG capsule Take 1 capsule (100 mg total) by mouth 3 (three) times daily as needed for cough. 04/10/16  Yes Raeford RazorStephen Kohut, MD  Buprenorphine HCl-Naloxone HCl (SUBOXONE) 8-2 MG FILM Place 1 Film under the tongue 2 (two) times daily.   Yes Historical Provider, MD  citalopram (CELEXA) 40 MG tablet Take 40 mg by mouth daily. 03/31/16  Yes Historical Provider, MD  diazepam (VALIUM) 5 MG tablet Take 5 mg by mouth daily as needed for anxiety.   Yes Historical Provider, MD  gabapentin (NEURONTIN) 300 MG capsule Take 600 mg by mouth 2 (two) times daily. *May take a max of 4500mg  daily. May take as needed. 04/04/16  Yes Historical Provider, MD  NUCYNTA 100 MG TABS Take 100 mg by mouth 4 (four) times daily. 03/08/16  Yes Historical Provider, MD  tiZANidine (ZANAFLEX) 4 MG tablet Take 4 mg by mouth 4 (four) times daily as needed for muscle spasms.  01/16/16  Yes Historical Provider, MD  traZODone (DESYREL) 100 MG tablet Take 200 mg by mouth at bedtime as needed for sleep.  02/27/16  Yes Historical Provider, MD  azithromycin (ZITHROMAX) 250 MG tablet Take 1 tablet (250 mg total) by mouth daily. 1  tablet daily. Had first dose in ER. Patient not taking: Reported on 04/10/2016 04/10/16   Raeford Razor, MD     Allergies:  No Known Allergies  Social History:   reports that he has been smoking.  He does not have any smokeless tobacco history on file. He reports that he does not drink alcohol or use illicit drugs.  Family  History: History reviewed. No pertinent family history.   Physical Exam: Filed Vitals:   04/10/16 2022 04/10/16 2100 04/11/16 0455 04/11/16 0500  BP:  130/71  115/64  Pulse:  66  62  Temp:  98.1 F (36.7 C)  98.4 F (36.9 C)  TempSrc:  Oral  Oral  Resp:  18  18  Height:      Weight:      SpO2: 98% 98% 98% 100%   Blood pressure 115/64, pulse 62, temperature 98.4 F (36.9 C), temperature source Oral, resp. rate 18, height  (1.778 m), weight 104.327 kg (230 lb), SpO2 100 %.  GEN:  Pleasant patient lying in the stretcher in no acute distress; cooperative with exam. PSYCH:  alert and oriented x4; does not appear anxious or depressed; affect is appropriate. HEENT: Mucous membranes pink and anicteric; PERRLA; EOM intact; no cervical lymphadenopathy nor thyromegaly or carotid bruit; no JVD; There were no stridor. Neck is very supple. Breasts:: Not examined CHEST WALL: No tenderness CHEST: Normal respiration, he has bilateral wheezing, but no rales.  HEART: Regular rate and rhythm.  There are no murmur, rub, or gallops.   BACK: No kyphosis or scoliosis; no CVA tenderness ABDOMEN: soft and non-tender; no masses, no organomegaly, normal abdominal bowel sounds; no pannus; no intertriginous candida. There is no rebound and no distention. Rectal Exam: Not done EXTREMITIES: No bone or joint deformity; age-appropriate arthropathy of the hands and knees; no edema; no ulcerations.  There is no calf tenderness. Genitalia: not examined PULSES: 2+ and symmetric SKIN: Normal hydration no rash or ulceration CNS: Cranial nerves 2-12 grossly intact no focal lateralizing neurologic deficit.  Speech is fluent; uvula elevated with phonation, facial symmetry and tongue midline. DTR are normal bilaterally, cerebella exam is intact, barbinski is negative and strengths are equaled bilaterally.  No sensory loss.   Labs on Admission:  Basic Metabolic Panel:  Recent Labs Lab 04/09/16 2218 04/11/16 0535   NA 135 140  K 3.6 4.6  CL 102 107  CO2 22 27  GLUCOSE 129* 109*  BUN 12 12  CREATININE 1.21 0.80  CALCIUM 8.0* 8.2*   CBC:  Recent Labs Lab 04/09/16 2218 04/11/16 0535  WBC 19.1* 9.2  HGB 13.1 12.5*  HCT 37.6* 37.3*  MCV 86.2 88.8  PLT 228 237   Cardiac Enzymes:  Recent Labs Lab 04/09/16 2218  TROPONINI <0.03     Radiological Exams on Admission: Dg Chest 2 View  04/09/2016  CLINICAL DATA:  Shortness of breath for 1 day. Left-sided chest pain EXAM: CHEST  2 VIEW COMPARISON:  None. FINDINGS: There is patchy infiltrate in the right middle lobe. Lungs elsewhere clear. Heart size and pulmonary vascularity are normal. No adenopathy. No bone lesions. IMPRESSION: Patchy right middle lobe infiltrate consistent with pneumonia. Lungs elsewhere clear. Cardiac silhouette within normal limits. Followup PA and lateral chest radiographs recommended in 3-4 weeks following trial of antibiotic therapy to ensure resolution and exclude underlying malignancy. Electronically Signed   By: Bretta Bang III M.D.   On: 04/09/2016 23:33   Dg Chest Port 1 View  04/10/2016  CLINICAL  DATA:  Shortness of breath and left-sided chest pain. Fever EXAM: PORTABLE CHEST 1 VIEW COMPARISON:  Yesterday FINDINGS: Progressively confluent bilateral patchy airspace disease. No cavitation or effusion is seen. Normal heart size and mediastinal contours accounting for low lung volumes. IMPRESSION: Progressive bilateral pneumonia. Electronically Signed   By: Marnee Spring M.D.   On: 04/10/2016 12:46    Assessment/Plan Present on Admission:  . CAP (community acquired pneumonia)  PLAN:  Will need to continue with IV Rocephin and Zithromax.  Continue with current Tx.  Make nebs RTC, and if he is not better with wheezing, will give steroids IV tomorrow.  He is improving slowly.   Other plans as per orders. Code Status: FULL Unk Lightning, MD.  FACP [facility] Pager [phone] 7pm to  7am.  04/11/2016, 11:27 AM

## 2016-04-12 MED ORDER — LEVOFLOXACIN 750 MG PO TABS: 750.0000 mg | ORAL_TABLET | Freq: Every day | ORAL | Status: DC

## 2016-04-12 MED ORDER — ALBUTEROL SULFATE HFA 108 (90 BASE) MCG/ACT IN AERS: 2.0000 | INHALATION_SPRAY | Freq: Four times a day (QID) | RESPIRATORY_TRACT | Status: DC | PRN

## 2016-04-12 MED ORDER — LEVOFLOXACIN IN D5W 750 MG/150ML IV SOLN: 750.0000 mg | INTRAVENOUS | Status: DC

## 2016-04-12 NOTE — Progress Notes (Signed)
Kimmy Swazil[place] discharged Home per MD order.  Discharge instructions reviewed and discussed with the patient, all questions and concerns answered. Copy of instructions and scripts given to patient.    Medication List    STOP taking these medications        azithromycin 250 MG tablet  Commonly known as:  ZITHROMAX      TAKE these medications        albuterol 108 (90 Base) MCG/ACT inhaler  Commonly known as:  PROVENTIL HFA;VENTOLIN HFA  Inhale 2 puffs into the lungs every 6 (six) hours as needed for wheezing or shortness of breath.     benzonatate 100 MG capsule  Commonly known as:  TESSALON PERLES  Take 1 capsule (100 mg total) by mouth 3 (three) times daily as needed for cough.     citalopram 40 MG tablet  Commonly known as:  CELEXA  Take 40 mg by mouth daily.     diazepam 5 MG tablet  Commonly known as:  VALIUM  Take 5 mg by mouth daily as needed for anxiety.     gabapentin 300 MG capsule  Commonly known as:  NEURONTIN  Take 600 mg by mouth 2 (two) times daily. *May take a max of 4500mg  daily. May take as needed.     levofloxacin 750 MG tablet  Commonly known as:  LEVAQUIN  Take 1 tablet (750 mg total) by mouth daily.     NUCYNTA 100 MG Tabs  Generic drug:  Tapentadol HCl  Take 100 mg by mouth 4 (four) times daily.     SUBOXONE 8-2 MG Film  Generic drug:  Buprenorphine HCl-Naloxone HCl  Place 1 Film under the tongue 2 (two) times daily.     tiZANidine 4 MG tablet  Commonly known as:  ZANAFLEX  Take 4 mg by mouth 4 (four) times daily as needed for muscle spasms.     traZODone 100 MG tablet  Commonly known as:  DESYREL  Take 200 mg by mouth at bedtime as needed for sleep.        Patients skin is clean, dry and intact, no evidence of skin break down. IV site discontinued and catheter remains intact. Site without signs and symptoms of complications. Dressing and pressure applied.  Patient escorted to car by NT,  no distress noted upon discharge.  Rica KoyanagiBonnie M  Orva Gwaltney 04/12/2016 12:18 PM

## 2016-04-12 NOTE — Discharge Summary (Signed)
Physician Discharge Summary  John [name] ZOX:[id number] DOB: 25-Sep-1976 DOA: 04/10/2016  PCP: No PCP Per Patient  Admit date: 04/10/2016 Discharge date: 04/12/2016  Time spent: 35 minutes  Recommendations for Outpatient Follow-up:  1. Follow up with PCP arranged for you.     Discharge Diagnoses:  Principal Problem:   CAP (community acquired pneumonia)   Discharge Condition: much improved.   Diet recommendation: healthy.  Filed Weights   04/10/16 1114  Weight: 104.327 kg (230 lb)    History of present illness: Patient was admitted for CAP by me on April 10, 2016.  As per my prior H and P:  " John [name] is an 40 y.o. male with hx of prior IVDA and narcotic abuse, depression, anxiety, returned to the ER since being seen last night for coughs, chills, and SOB. He denied myalgia or rigors, but had some chills. He hadn't filled his Zithromax oral meds. He denied ill contact, nausea, or vomiting. In the ER, CXR showed PNA, and WBC was elevated to 18K. He was hypoxic with oxygen sat in the 80'cotics. He requested that no narcotic be given to him.    Hospital Course:  Patient was admitted, given oxygen supplementation, and started on IV Rocephin and ZIthromax.  The following day, he was feeling better, but had significant wheezing and still SOB.   He was given RTC nebs Tx, but steroids were not given.  He subsequently improved, and oxygen was discontinued with good Sat and no SOB.  He did not require and did not want any narcotics.  He no longer has any wheezing, feels well, required no oxygen, and has been anxious to go home.  I will discharge him on Levaquin 750mg  per day for another 10 days, and gave him Ventolin MDI.  Arrangement was made for him to follow up with his PCP.   Thank you and Good Day.     Discharge Exam: Filed Vitals:   04/11/16 2110 04/12/16 0500  BP: 136/61 119/54  Pulse: 67 64  Temp: 97.9 F (36.6 C) 98.1 F (36.7 C)  Resp: 19 18   Discharge  Instructions   Discharge Instructions    Diet - low sodium heart healthy    Complete by:  As directed      Increase activity slowly    Complete by:  As directed           Current Discharge Medication List    START taking these medications   Details  albuterol (PROVENTIL HFA;VENTOLIN HFA) 108 (90 Base) MCG/ACT inhaler Inhale 2 puffs into the lungs every 6 (six) hours as needed for wheezing or shortness of breath. Qty: 1 Inhaler, Refills: 2    levofloxacin (LEVAQUIN) 750 MG tablet Take 1 tablet (750 mg total) by mouth daily. Qty: 10 tablet, Refills: 0      CONTINUE these medications which have NOT CHANGED   Details  benzonatate (TESSALON PERLES) 100 MG capsule Take 1 capsule (100 mg total) by mouth 3 (three) times daily as needed for cough. Qty: 20 capsule, Refills: 0    Buprenorphine HCl-Naloxone HCl (SUBOXONE) 8-2 MG FILM Place 1 Film under the tongue 2 (two) times daily.    citalopram (CELEXA) 40 MG tablet Take 40 mg by mouth daily. Refills: 5    diazepam (VALIUM) 5 MG tablet Take 5 mg by mouth daily as needed for anxiety.    gabapentin (NEURONTIN) 300 MG capsule Take 600 mg by mouth 2 (two) times daily. *May take a max of 4500mg   daily. May take as needed. Refills: 3    NUCYNTA 100 MG TABS Take 100 mg by mouth 4 (four) times daily. Refills: 0    tiZANidine (ZANAFLEX) 4 MG tablet Take 4 mg by mouth 4 (four) times daily as needed for muscle spasms.  Refills: 5    traZODone (DESYREL) 100 MG tablet Take 200 mg by mouth at bedtime as needed for sleep.  Refills: 4      STOP taking these medications     azithromycin (ZITHROMAX) 250 MG tablet        No Known Allergies    The results of significant diagnostics from this hospitalization (including imaging, microbiology, ancillary and laboratory) are listed below for reference.    Significant Diagnostic Studies: Dg Chest 2 View  04/09/2016  CLINICAL DATA:  Shortness of breath for 1 day. Left-sided chest pain EXAM:  CHEST  2 VIEW COMPARISON:  None. FINDINGS: There is patchy infiltrate in the right middle lobe. Lungs elsewhere clear. Heart size and pulmonary vascularity are normal. No adenopathy. No bone lesions. IMPRESSION: Patchy right middle lobe infiltrate consistent with pneumonia. Lungs elsewhere clear. Cardiac silhouette within normal limits. Followup PA and lateral chest radiographs recommended in 3-4 weeks following trial of antibiotic therapy to ensure resolution and exclude underlying malignancy. Electronically Signed   By: Bretta Bang III M.D.   On: 04/09/2016 23:33   Dg Chest Port 1 View  04/10/2016  CLINICAL DATA:  Shortness of breath and left-sided chest pain. Fever EXAM: PORTABLE CHEST 1 VIEW COMPARISON:  Yesterday FINDINGS: Progressively confluent bilateral patchy airspace disease. No cavitation or effusion is seen. Normal heart size and mediastinal contours accounting for low lung volumes. IMPRESSION: Progressive bilateral pneumonia. Electronically Signed   By: Marnee Spring M.D.   On: 04/10/2016 12:46    Microbiology: No results found for this or any previous visit (from the past 240 hour(s)).   Labs: Basic Metabolic Panel:  Recent Labs Lab 04/09/16 2218 04/11/16 0535  NA 135 140  K 3.6 4.6  CL 102 107  CO2 22 27  GLUCOSE 129* 109*  BUN 12 12  CREATININE 1.21 0.80  CALCIUM 8.0* 8.2*   CBC:  Recent Labs Lab 04/09/16 2218 04/11/16 0535  WBC 19.1* 9.2  HGB 13.1 12.5*  HCT 37.6* 37.3*  MCV 86.2 88.8  PLT 228 237   Cardiac Enzymes:  Recent Labs Lab 04/09/16 2218  TROPONINI <0.03    Signed:  Houston Siren MD.  [facility] 04/12/2016, 11:29 AM

## 2016-04-17 ENCOUNTER — Ambulatory Visit: Payer: Self-pay | Admitting: Urology

## 2016-05-31 ENCOUNTER — Ambulatory Visit (HOSPITAL_COMMUNITY): Admission: RE | Admit: 2016-05-31 | Payer: Medicaid Other | Source: Ambulatory Visit

## 2016-06-14 ENCOUNTER — Emergency Department (HOSPITAL_COMMUNITY)
Admission: EM | Admit: 2016-06-14 | Discharge: 2016-06-14 | Disposition: A | Discharge status: Home / Self Care | Payer: Medicaid Other | Attending: Emergency Medicine | Admitting: Emergency Medicine

## 2016-06-14 ENCOUNTER — Encounter (HOSPITAL_COMMUNITY): Payer: Self-pay | Admitting: *Deleted

## 2016-06-14 DIAGNOSIS — L259 Unspecified contact dermatitis, unspecified cause: Secondary | ICD-10-CM

## 2016-06-14 DIAGNOSIS — F17200 Nicotine dependence, unspecified, uncomplicated: Secondary | ICD-10-CM | POA: Diagnosis not present

## 2016-06-14 DIAGNOSIS — F329 Major depressive disorder, single episode, unspecified: Secondary | ICD-10-CM | POA: Insufficient documentation

## 2016-06-14 DIAGNOSIS — R21 Rash and other nonspecific skin eruption: Secondary | ICD-10-CM | POA: Diagnosis present

## 2016-06-14 MED ORDER — DEXAMETHASONE 4 MG PO TABS: 4.0000 mg | ORAL_TABLET | Freq: Two times a day (BID) | ORAL | Status: DC

## 2016-06-14 MED ORDER — DEXAMETHASONE SODIUM PHOSPHATE 4 MG/ML IJ SOLN
8.0000 mg | Freq: Once | INTRAMUSCULAR | Status: AC
  Administered 2016-06-14: 8 mg via INTRAMUSCULAR
  Filled 2016-06-14: qty 2

## 2016-06-14 NOTE — Discharge Instructions (Signed)
Please use Decadron 2 times daily with a meal. Continue to use the Benadryl cream, and the Benadryl tablet at bedtime if needed. See Dr Nita SellsJohn Hall for dermatology evaluation if not improving. Contact Dermatitis Dermatitis is redness, soreness, and swelling (inflammation) of the skin. Contact dermatitis is a reaction to certain substances that touch the skin. You either touched something that irritated your skin, or you have allergies to something you touched.  HOME CARE  Skin Care  Moisturize your skin as needed.  Apply cool compresses to the affected areas.   Try taking a bath with:   Epsom salts. Follow the instructions on the package. You can get these at a pharmacy or grocery store.   Baking soda. Pour a small amount into the bath as told by your doctor.   Colloidal oatmeal. Follow the instructions on the package. You can get this at a pharmacy or grocery store.   Try applying baking soda paste to your skin. Stir water into baking soda until it looks like paste.  Do not scratch your skin.   Bathe less often.  Bathe in lukewarm water. Avoid using hot water.  Medicines  Take or apply over-the-counter and prescription medicines only as told by your doctor.   If you were prescribed an antibiotic medicine, take or apply your antibiotic as told by your doctor. Do not stop taking the antibiotic even if your condition starts to get better. General Instructions  Keep all follow-up visits as told by your doctor. This is important.   Avoid the substance that caused your reaction. If you do not know what caused it, keep a journal to try to track what caused it. Write down:   What you eat.   What cosmetic products you use.   What you drink.   What you wear in the affected area. This includes jewelry.   If you were given a bandage (dressing), take care of it as told by your doctor. This includes when to change and remove it.  GET HELP IF:   You do not get better  with treatment.   Your condition gets worse.   You have signs of infection such as:  Swelling.  Tenderness.  Redness.  Soreness.  Warmth.   You have a fever.   You have new symptoms.  GET HELP RIGHT AWAY IF:   You have a very bad headache.  You have neck pain.  Your neck is stiff.   You throw up (vomit).   You feel very sleepy.   You see red streaks coming from the affected area.   Your bone or joint underneath the affected area becomes painful after the skin has healed.   The affected area turns darker.   You have trouble breathing.    This information is not intended to replace advice given to you by your health care provider. Make sure you discuss any questions you have with your health care provider.   Document Released: 10/13/2009 Document Revised: 09/06/2015 Document Reviewed: 05/03/2015 Elsevier Interactive Patient Education [employer]2016 [employer] Inc.

## 2016-06-14 NOTE — ED Notes (Signed)
Pt was around poison ivy about 1.5 weeks ago and pt began to break out in a red, itchy rash afterwards. Rash is on bilateral arms, torso and neck.

## 2016-06-14 NOTE — ED Provider Notes (Signed)
CSN: [id number]     Arrival date & time 06/14/16  1441 History   First MD Initiated Contact with Patient 06/14/16 1524     Chief Complaint  Patient presents with  . Rash     (Consider location/radiation/quality/duration/timing/severity/associated sxs/prior Treatment) HPI Comments: Patient is a 40 year old male who presents to the emergency department with a complaint of a rash.  The patient states that over the last week and a half he has been around poison ivy. He has had breakouts on started on his arms, and now on his arms chest and neck. His been no shortness of breath, no swelling of the face or lips. No difficulty with breathing appreciated. No difficulty with swallowing noted.  The history is provided by the patient.    Past Medical History  Diagnosis Date  . Testicular torsion   . Depression   . Anxiety    Past Surgical History  Procedure Laterality Date  . Appendectomy    . Vasectomy    . Eye surgery    . Nasal sinus surgery     No family history on file. Social History  Substance Use Topics  . Smoking status: Current Every Day Smoker -- 1.50 packs/day  . Smokeless tobacco: None  . Alcohol Use: No    Review of Systems  Skin: Positive for rash.  Psychiatric/Behavioral: The patient is nervous/anxious.   All other systems reviewed and are negative.     Allergies  Review of patient's allergies indicates no known allergies.  Home Medications   Prior to Admission medications   Medication Sig Start Date End Date Taking? Authorizing Provider  albuterol (PROVENTIL HFA;VENTOLIN HFA) 108 (90 Base) MCG/ACT inhaler Inhale 2 puffs into the lungs every 6 (six) hours as needed for wheezing or shortness of breath. 04/12/16   Houston Siren, MD  benzonatate (TESSALON PERLES) 100 MG capsule Take 1 capsule (100 mg total) by mouth 3 (three) times daily as needed for cough. 04/10/16   Raeford Razor, MD  Buprenorphine HCl-Naloxone HCl (SUBOXONE) 8-2 MG FILM Place 1 Film under the  tongue 2 (two) times daily.    Historical Provider, MD  citalopram (CELEXA) 40 MG tablet Take 40 mg by mouth daily. 03/31/16   Historical Provider, MD  diazepam (VALIUM) 5 MG tablet Take 5 mg by mouth daily as needed for anxiety.    Historical Provider, MD  gabapentin (NEURONTIN) 300 MG capsule Take 600 mg by mouth 2 (two) times daily. *May take a max of  daily. May take as needed. 04/04/16   Historical Provider, MD  levofloxacin (LEVAQUIN) 750 MG tablet Take 1 tablet (750 mg total) by mouth daily. 04/12/16   Houston Siren, MD  NUCYNTA 100 MG TABS Take 100 mg by mouth 4 (four) times daily. 03/08/16   Historical Provider, MD  tiZANidine (ZANAFLEX) 4 MG tablet Take 4 mg by mouth 4 (four) times daily as needed for muscle spasms.  01/16/16   Historical Provider, MD  traZODone (DESYREL) 100 MG tablet Take 200 mg by mouth at bedtime as needed for sleep.  02/27/16   Historical Provider, MD   BP 130/86 mmHg  Pulse 68  Temp(Src) 98.2 F (36.8 C) (Oral)  Resp 18  Ht  (1.778 m)  Wt 104.327 kg  BMI 33.00 kg/m2  SpO2 96% Physical Exam  Constitutional: He is oriented to person, place, and time. He appears well-developed and well-nourished.  Non-toxic appearance.  HENT:  Head: Normocephalic.  Right Ear: Tympanic membrane and external ear normal.  Left Ear: Tympanic membrane and external ear normal.  Eyes: EOM and lids are normal. Pupils are equal, round, and reactive to light.  Neck: Normal range of motion. Neck supple. Carotid bruit is not present.  Cardiovascular: Normal rate, regular rhythm, normal heart sounds, intact distal pulses and normal pulses.   Pulmonary/Chest: Breath sounds normal. No respiratory distress.  Abdominal: Soft. Bowel sounds are normal. There is no tenderness. There is no guarding.  Musculoskeletal: Normal range of motion. He exhibits no edema.  Lymphadenopathy:       Head (right side): No submandibular adenopathy present.       Head (left side): No submandibular adenopathy  present.    He has no cervical adenopathy.  Neurological: He is alert and oriented to person, place, and time. He has normal strength. No cranial nerve deficit or sensory deficit.  Skin: Skin is warm and dry. Rash noted.  Red macular rash on the hands, arms, torso.  Psychiatric: He has a normal mood and affect. His speech is normal.  Nursing note and vitals reviewed.   ED Course  Procedures (including critical care time) Labs Review Labs Reviewed - No data to display  Imaging Review No results found. I have personally reviewed and evaluated these images and lab results as part of my medical decision-making.   EKG Interpretation None      MDM  The examination favors contact dermatitis. Patient treated in the emergency department with intramuscular Decadron. Description for Decadron twice a day also given. Patient is using Benadryl cream and Benadryl tablets for itching. The patient will follow-up with Dr. Nita SellsJohn Hall, dermatology, if not improving.    Final diagnoses:  Contact dermatitis    **I have reviewed nursing notes, vital signs, and all appropriate lab and imaging results for this patient.Ivery Quale*    Latressa Harries, PA-C 06/14/16 1542  Glynn OctaveStephen Rancour, MD 06/14/16 1949

## 2016-07-12 ENCOUNTER — Encounter (HOSPITAL_COMMUNITY): Payer: Self-pay | Admitting: Emergency Medicine

## 2016-07-12 ENCOUNTER — Emergency Department (HOSPITAL_COMMUNITY)
Admission: EM | Admit: 2016-07-12 | Discharge: 2016-07-12 | Disposition: A | Discharge status: Home / Self Care | Payer: Medicaid Other | Attending: Emergency Medicine | Admitting: Emergency Medicine

## 2016-07-12 DIAGNOSIS — F17200 Nicotine dependence, unspecified, uncomplicated: Secondary | ICD-10-CM | POA: Insufficient documentation

## 2016-07-12 DIAGNOSIS — Z79899 Other long term (current) drug therapy: Secondary | ICD-10-CM | POA: Diagnosis not present

## 2016-07-12 DIAGNOSIS — R21 Rash and other nonspecific skin eruption: Secondary | ICD-10-CM | POA: Diagnosis present

## 2016-07-12 DIAGNOSIS — L259 Unspecified contact dermatitis, unspecified cause: Secondary | ICD-10-CM | POA: Diagnosis not present

## 2016-07-12 DIAGNOSIS — F329 Major depressive disorder, single episode, unspecified: Secondary | ICD-10-CM | POA: Diagnosis not present

## 2016-07-12 MED ORDER — PREDNISONE 50 MG PO TABS
60.0000 mg | ORAL_TABLET | Freq: Once | ORAL | Status: AC
  Administered 2016-07-12: 60 mg via ORAL
  Filled 2016-07-12: qty 1

## 2016-07-12 MED ORDER — PREDNISONE 10 MG PO TABS: ORAL_TABLET | ORAL | Status: DC

## 2016-07-12 NOTE — ED Provider Notes (Signed)
CSN: [id number]     Arrival date & time 07/12/16  1127 History   First MD Initiated Contact with Patient 07/12/16 1150     Chief Complaint  Patient presents with  . Rash     (Consider location/radiation/quality/duration/timing/severity/associated sxs/prior Treatment) The history is provided by the patient.   John [name] is a 40 y.o. male presenting for evaluation of facial redness which has been present for one day.  He describes possible contact with brake fluid as he was working on a vehicle 2 days ago and rubs his face with a cloth which may have had this fluid on it.  By that evening he had mild tightness sensation in his face which has progressed to increased erythema today.  He denies any  pain or itching, simply tightness of his skin around his mouth and cheek area..  There are several areas with a scant clear drainage.  He denies any other symptoms and has had no treatments prior to arrival.     Past Medical History  Diagnosis Date  . Testicular torsion   . Depression   . Anxiety    Past Surgical History  Procedure Laterality Date  . Appendectomy    . Vasectomy    . Eye surgery    . Nasal sinus surgery     History reviewed. No pertinent family history. Social History  Substance Use Topics  . Smoking status: Current Every Day Smoker -- 1.50 packs/day  . Smokeless tobacco: None  . Alcohol Use: No    Review of Systems  Constitutional: Negative for fever and chills.  Respiratory: Negative for shortness of breath and wheezing.   Skin: Positive for rash.  Neurological: Negative for numbness.      Allergies  Review of patient's allergies indicates no known allergies.  Home Medications   Prior to Admission medications   Medication Sig Start Date End Date Taking? Authorizing Provider  albuterol (PROVENTIL HFA;VENTOLIN HFA) 108 (90 Base) MCG/ACT inhaler Inhale 2 puffs into the lungs every 6 (six) hours as needed for wheezing or shortness of breath. 04/12/16  Yes  Houston SirenPeter Le, MD  Buprenorphine HCl-Naloxone HCl (SUBOXONE) 8-2 MG FILM Place 1 Film under the tongue 2 (two) times daily.   Yes Historical Provider, MD  citalopram (CELEXA) 40 MG tablet Take 40 mg by mouth daily. 03/31/16  Yes Historical Provider, MD  gabapentin (NEURONTIN) 300 MG capsule Take 600 mg by mouth 2 (two) times daily. *May take a max of 4500mg  daily. May take as needed. 04/04/16  Yes Historical Provider, MD  NUCYNTA 100 MG TABS Take 100 mg by mouth 4 (four) times daily. Reported on 07/12/2016 03/08/16  Yes Historical Provider, MD  benzonatate (TESSALON PERLES) 100 MG capsule Take 1 capsule (100 mg total) by mouth 3 (three) times daily as needed for cough. Patient not taking: Reported on 07/12/2016 04/10/16   Raeford RazorStephen Kohut, MD  dexamethasone (DECADRON) 4 MG tablet Take 1 tablet (4 mg total) by mouth 2 (two) times daily with a meal. Patient not taking: Reported on 07/12/2016 06/14/16   Ivery QualeHobson Bryant, PA-C  levofloxacin (LEVAQUIN) 750 MG tablet Take 1 tablet (750 mg total) by mouth daily. Patient not taking: Reported on 07/12/2016 04/12/16   Houston SirenPeter Le, MD  predniSONE (DELTASONE) 10 MG tablet 5, 4, 3, 2 then 1 tablet by mouth daily for 5days total. 07/13/16   Burgess AmorJulie Bellany Elbaum, PA-C   BP 145/84 mmHg  Pulse 78  Temp(Src) 98.7 F (37.1 C) (Oral)  Resp 16  Ht 5'  10" (1.778 m)  Wt 99.791 kg  BMI 31.57 kg/m2  SpO2 98% Physical Exam  Constitutional: He appears well-developed and well-nourished. No distress.  HENT:  Head: Normocephalic.  Neck: Neck supple.  Cardiovascular: Normal rate.   Pulmonary/Chest: Effort normal. He has no wheezes.  Musculoskeletal: Normal range of motion. He exhibits no edema.  Skin: There is erythema.  Mild erythema and edema of bilateral cheeks, nose and perioral area.  No vesicles, pustules or ulcerations.  No drainage.    ED Course  Procedures (including critical care time) Labs Review Labs Reviewed - No data to display  Imaging Review No results found. I have personally  reviewed and evaluated these images and lab results as part of my medical decision-making.   EKG Interpretation None      MDM   Final diagnoses:  Contact dermatitis    Patient advised cool compresses, he was placed on a 6 day prednisone taper.  Advised a apply antibiotic ointment of choice twice a day.  Plan follow-up if symptoms are not resolved with this treatment or for any worsening symptoms.    [name], PA-C 07/12/16 1224  Samuel Jester, DO 07/14/16 [id number]

## 2016-07-12 NOTE — ED Notes (Signed)
PA at bedside.

## 2016-07-12 NOTE — Discharge Instructions (Signed)
Contact Dermatitis Dermatitis is redness, soreness, and swelling (inflammation) of the skin. Contact dermatitis is a reaction to certain substances that touch the skin. There are two types of contact dermatitis:   Irritant contact dermatitis. This type is caused by something that irritates your skin, such as dry hands from washing them too much. This type does not require previous exposure to the substance for a reaction to occur. This type is more common.  Allergic contact dermatitis. This type is caused by a substance that you are allergic to, such as a nickel allergy or poison ivy. This type only occurs if you have been exposed to the substance (allergen) before. Upon a repeat exposure, your body reacts to the substance. This type is less common. CAUSES  Many different substances can cause contact dermatitis. Irritant contact dermatitis is most commonly caused by exposure to:   Makeup.   Soaps.   Detergents.   Bleaches.   Acids.   Metal salts, such as nickel.  Allergic contact dermatitis is most commonly caused by exposure to:   Poisonous plants.   Chemicals.   Jewelry.   Latex.   Medicines.   Preservatives in products, such as clothing.  RISK FACTORS This condition is more likely to develop in:   People who have jobs that expose them to irritants or allergens.  People who have certain medical conditions, such as asthma or eczema.  SYMPTOMS  Symptoms of this condition may occur anywhere on your body where the irritant has touched you or is touched by you. Symptoms include:  Dryness or flaking.   Redness.   Cracks.   Itching.   Pain or a burning feeling.   Blisters.  Drainage of small amounts of blood or clear fluid from skin cracks. With allergic contact dermatitis, there may also be swelling in areas such as the eyelids, mouth, or genitals.  DIAGNOSIS  This condition is diagnosed with a medical history and physical exam. A patch skin test  may be performed to help determine the cause. If the condition is related to your job, you may need to see an occupational medicine specialist. TREATMENT Treatment for this condition includes figuring out what caused the reaction and protecting your skin from further contact. Treatment may also include:   Steroid creams or ointments. Oral steroid medicines may be needed in more severe cases.  Antibiotics or antibacterial ointments, if a skin infection is present.  Antihistamine lotion or an antihistamine taken by mouth to ease itching.  A bandage (dressing). HOME CARE INSTRUCTIONS Skin Care  Moisturize your skin as needed.   Apply cool compresses to the affected areas.  Try taking a bath with:  Epsom salts. Follow the instructions on the packaging. You can get these at your local pharmacy or grocery store.  Baking soda. Pour a small amount into the bath as directed by your health care provider.  Colloidal oatmeal. Follow the instructions on the packaging. You can get this at your local pharmacy or grocery store.  Try applying baking soda paste to your skin. Stir water into baking soda until it reaches a paste-like consistency.  Do not scratch your skin.  Bathe less frequently, such as every other day.  Bathe in lukewarm water. Avoid using hot water. Medicines  Take or apply over-the-counter and prescription medicines only as told by your health care provider.   If you were prescribed an antibiotic medicine, take or apply your antibiotic as told by your health care provider. Do not stop using the   antibiotic even if your condition starts to improve. General Instructions  Keep all follow-up visits as told by your health care provider. This is important.  Avoid the substance that caused your reaction. If you do not know what caused it, keep a journal to try to track what caused it. Write down:  What you eat.  What cosmetic products you use.  What you drink.  What  you wear in the affected area. This includes jewelry.  If you were given a dressing, take care of it as told by your health care provider. This includes when to change and remove it. SEEK MEDICAL CARE IF:   Your condition does not improve with treatment.  Your condition gets worse.  You have signs of infection such as swelling, tenderness, redness, soreness, or warmth in the affected area.  You have a fever.  You have new symptoms. SEEK IMMEDIATE MEDICAL CARE IF:   You have a severe headache, neck pain, or neck stiffness.  You vomit.  You feel very sleepy.  You notice red streaks coming from the affected area.  Your bone or joint underneath the affected area becomes painful after the skin has healed.  The affected area turns darker.  You have difficulty breathing.   This information is not intended to replace advice given to you by your health care provider. Make sure you discuss any questions you have with your health care provider.   Document Released: 12/13/2000 Document Revised: 09/06/2015 Document Reviewed: 05/03/2015 Elsevier Interactive Patient Education 2016 [employer]  

## 2016-07-12 NOTE — ED Notes (Signed)
Patient complaining of redness to facial area. States "it might be brake fluid because I was working on my truck and I used the same rag I used to wipe up the brake fluid to wipe my face 2 days ago."

## 2016-07-14 ENCOUNTER — Emergency Department (HOSPITAL_COMMUNITY)
Admission: EM | Admit: 2016-07-14 | Discharge: 2016-07-14 | Disposition: A | Discharge status: Home / Self Care | Payer: Medicaid Other | Attending: Emergency Medicine | Admitting: Emergency Medicine

## 2016-07-14 ENCOUNTER — Encounter (HOSPITAL_COMMUNITY): Payer: Self-pay | Admitting: *Deleted

## 2016-07-14 DIAGNOSIS — F329 Major depressive disorder, single episode, unspecified: Secondary | ICD-10-CM | POA: Insufficient documentation

## 2016-07-14 DIAGNOSIS — F17200 Nicotine dependence, unspecified, uncomplicated: Secondary | ICD-10-CM | POA: Diagnosis not present

## 2016-07-14 DIAGNOSIS — L259 Unspecified contact dermatitis, unspecified cause: Secondary | ICD-10-CM | POA: Insufficient documentation

## 2016-07-14 DIAGNOSIS — R21 Rash and other nonspecific skin eruption: Secondary | ICD-10-CM | POA: Diagnosis present

## 2016-07-14 MED ORDER — DEXAMETHASONE SODIUM PHOSPHATE 10 MG/ML IJ SOLN
10.0000 mg | Freq: Once | INTRAMUSCULAR | Status: AC
  Administered 2016-07-14: 10 mg via INTRAMUSCULAR
  Filled 2016-07-14: qty 1

## 2016-07-14 NOTE — ED Notes (Signed)
Pt c/o continued rash that he was seen here for on 07-12-16. Pt states that the rash has started to spread on hands and arms.

## 2016-07-14 NOTE — ED Notes (Signed)
Contact dermatitis noted to face, left eye, forearms, and hands. C/o itching. Did not complete prednisone script.

## 2016-07-15 NOTE — ED Provider Notes (Signed)
CSN: [id number]     Arrival date & time 07/14/16  2011 History   First MD Initiated Contact with Patient 07/14/16 2018     Chief Complaint  Patient presents with  . Rash     (Consider location/radiation/quality/duration/timing/severity/associated sxs/prior Treatment) HPI  John [name] is a 40 y.o. male who presents to the Emergency Department complaining of persistent rash to his face, hands and forearms.  He states that he was seen here two days ago for same and prescribed prednisone.  He admits to taking one dose of the prescription, but has not taken any more.  He reports the rash has been spreading on his face and hands.  Mild to moderate itching.  He states the rash on his face feels "tight"  He denies shortness of breath, difficulty swallowing and pain.  He has been taking benadryl without relief.     Past Medical History  Diagnosis Date  . Testicular torsion   . Depression   . Anxiety    Past Surgical History  Procedure Laterality Date  . Appendectomy    . Vasectomy    . Eye surgery    . Nasal sinus surgery     History reviewed. No pertinent family history. Social History  Substance Use Topics  . Smoking status: Current Every Day Smoker -- 1.50 packs/day  . Smokeless tobacco: None  . Alcohol Use: No    Review of Systems  Constitutional: Negative for fever, chills, activity change and appetite change.  HENT: Negative for facial swelling, sore throat and trouble swallowing.   Respiratory: Negative for chest tightness, shortness of breath and wheezing.   Musculoskeletal: Negative for neck pain and neck stiffness.  Skin: Positive for rash. Negative for wound.  Neurological: Negative for dizziness, weakness, numbness and headaches.  All other systems reviewed and are negative.     Allergies  Review of patient's allergies indicates no known allergies.  Home Medications   Prior to Admission medications   Medication Sig Start Date End Date Taking? Authorizing  Provider  albuterol (PROVENTIL HFA;VENTOLIN HFA) 108 (90 Base) MCG/ACT inhaler Inhale 2 puffs into the lungs every 6 (six) hours as needed for wheezing or shortness of breath. 04/12/16   Houston SirenPeter Le, MD  benzonatate (TESSALON PERLES) 100 MG capsule Take 1 capsule (100 mg total) by mouth 3 (three) times daily as needed for cough. Patient not taking: Reported on 07/12/2016 04/10/16   Raeford RazorStephen Kohut, MD  Buprenorphine HCl-Naloxone HCl (SUBOXONE) 8-2 MG FILM Place 1 Film under the tongue 2 (two) times daily.    Historical Provider, MD  citalopram (CELEXA) 40 MG tablet Take 40 mg by mouth daily. 03/31/16   Historical Provider, MD  dexamethasone (DECADRON) 4 MG tablet Take 1 tablet (4 mg total) by mouth 2 (two) times daily with a meal. Patient not taking: Reported on 07/12/2016 06/14/16   Ivery QualeHobson Bryant, PA-C  gabapentin (NEURONTIN) 300 MG capsule Take 600 mg by mouth 2 (two) times daily. *May take a max of 4500mg  daily. May take as needed. 04/04/16   Historical Provider, MD  levofloxacin (LEVAQUIN) 750 MG tablet Take 1 tablet (750 mg total) by mouth daily. Patient not taking: Reported on 07/12/2016 04/12/16   Houston SirenPeter Le, MD  NUCYNTA 100 MG TABS Take 100 mg by mouth 4 (four) times daily. Reported on 07/12/2016 03/08/16   Historical Provider, MD  predniSONE (DELTASONE) 10 MG tablet 5, 4, 3, 2 then 1 tablet by mouth daily for 5days total. 07/13/16   Burgess AmorJulie Idol, PA-C  BP 163/88 mmHg  Pulse 82  Temp(Src) 98.5 F (36.9 C) (Oral)  Resp 16  Ht  (1.778 m)  Wt 99.791 kg  BMI 31.57 kg/m2  SpO2 98% Physical Exam  Constitutional: He is oriented to person, place, and time. He appears well-developed and well-nourished. No distress.  HENT:  Head: Normocephalic and atraumatic.  Mouth/Throat: Oropharynx is clear and moist.  Neck: Normal range of motion. Neck supple.  Cardiovascular: Normal rate, regular rhythm, normal heart sounds and intact distal pulses.   No murmur heard. Pulmonary/Chest: Effort normal and breath  sounds normal. No respiratory distress.  Musculoskeletal: He exhibits no edema or tenderness.  Lymphadenopathy:    He has no cervical adenopathy.  Neurological: He is alert and oriented to person, place, and time. He exhibits normal muscle tone. Coordination normal.  Skin: Skin is warm. Rash noted. There is erythema.  Erythematous maculopapular rash to the lower face, bilateral hands and forearms. No vesicles or pustules  Nursing note and vitals reviewed.   ED Course  Procedures (including critical care time) Labs Review Labs Reviewed - No data to display  Imaging Review No results found. I have personally reviewed and evaluated these images and lab results as part of my medical decision-making.   EKG Interpretation None      MDM   Final diagnoses:  Contact dermatitis    IM decadron given.  Advised pt to take his Rx prednisone as directed and to continue the benadryl . Agrees to plan, airway patent.  Stable for d/c    Pauline Aus, PA-C 07/15/16 0120  Donnetta Hutching, MD 07/16/16 [phone]

## 2016-08-20 ENCOUNTER — Emergency Department (HOSPITAL_COMMUNITY)
Admission: EM | Admit: 2016-08-20 | Discharge: 2016-08-20 | Disposition: A | Discharge status: Home / Self Care | Payer: Medicaid Other | Attending: Emergency Medicine | Admitting: Emergency Medicine

## 2016-08-20 ENCOUNTER — Encounter (HOSPITAL_COMMUNITY): Payer: Self-pay | Admitting: Emergency Medicine

## 2016-08-20 DIAGNOSIS — Z79899 Other long term (current) drug therapy: Secondary | ICD-10-CM | POA: Insufficient documentation

## 2016-08-20 DIAGNOSIS — M79672 Pain in left foot: Secondary | ICD-10-CM | POA: Diagnosis present

## 2016-08-20 DIAGNOSIS — F17210 Nicotine dependence, cigarettes, uncomplicated: Secondary | ICD-10-CM | POA: Diagnosis not present

## 2016-08-20 DIAGNOSIS — L03116 Cellulitis of left lower limb: Secondary | ICD-10-CM | POA: Diagnosis not present

## 2016-08-20 DIAGNOSIS — L03119 Cellulitis of unspecified part of limb: Secondary | ICD-10-CM

## 2016-08-20 MED ORDER — CLINDAMYCIN HCL 300 MG PO CAPS: 300.0000 mg | ORAL_CAPSULE | Freq: Four times a day (QID) | ORAL | 0 refills | Status: DC

## 2016-08-20 NOTE — Discharge Instructions (Signed)
Soak the feet in warm water, and clean well with soap, 2 or 3 times a day.  Watch for signs of worsening swelling or pain.  Return here if needed.

## 2016-08-20 NOTE — ED Triage Notes (Signed)
Pt states he works at [employer] that uses ground glass beads and some got into his work boots about a week ago and the tops of both feet are raw, more on left than right.  Has been using neosporin.

## 2016-08-20 NOTE — ED Provider Notes (Signed)
[facility]-EMERGENCY DEPT Provider Note   CSN: [id number] Arrival date & time: 08/20/16  1325     History   Chief Complaint Chief Complaint  Patient presents with  . Foot Pain    HPI John [name] is a 40 y.o. male.  He complains of pain and swelling in both feet, left greater than right for several days, after getting some glass beads inside his boots, while at work. Similar problem several weeks ago that improved spontaneously. He denies fever, chills, nausea, vomiting, cough, shortness of breath or chest pain. He denies history of diabetes. There are no other known modifying factors.  HPI  Past Medical History:  Diagnosis Date  . Anxiety   . Depression   . Testicular torsion     Patient Active Problem List   Diagnosis Date Noted  . CAP (community acquired pneumonia) 04/10/2016    Past Surgical History:  Procedure Laterality Date  . APPENDECTOMY    . EYE SURGERY    . NASAL SINUS SURGERY    . VASECTOMY      OB History    No data available       Home Medications    Prior to Admission medications   Medication Sig Start Date End Date Taking? Authorizing Provider  albuterol (PROVENTIL HFA;VENTOLIN HFA) 108 (90 Base) MCG/ACT inhaler Inhale 2 puffs into the lungs every 6 (six) hours as needed for wheezing or shortness of breath. 04/12/16   Houston SirenPeter Le, MD  benzonatate (TESSALON PERLES) 100 MG capsule Take 1 capsule (100 mg total) by mouth 3 (three) times daily as needed for cough. Patient not taking: Reported on 07/12/2016 04/10/16   Raeford RazorStephen Kohut, MD  Buprenorphine HCl-Naloxone HCl (SUBOXONE) 8-2 MG FILM Place 1 Film under the tongue 2 (two) times daily.    Historical Provider, MD  citalopram (CELEXA) 40 MG tablet Take 40 mg by mouth daily. 03/31/16   Historical Provider, MD  clindamycin (CLEOCIN) 300 MG capsule Take 1 capsule (300 mg total) by mouth 4 (four) times daily. X 7 days 08/20/16   Mancel BaleElliott Breck Hollinger, MD  dexamethasone (DECADRON) 4 MG tablet Take 1 tablet (4 mg total) by  mouth 2 (two) times daily with a meal. Patient not taking: Reported on 07/12/2016 06/14/16   Ivery QualeHobson Bryant, PA-C  gabapentin (NEURONTIN) 300 MG capsule Take 600 mg by mouth 2 (two) times daily. *May take a max of 4500mg  daily. May take as needed. 04/04/16   Historical Provider, MD  levofloxacin (LEVAQUIN) 750 MG tablet Take 1 tablet (750 mg total) by mouth daily. Patient not taking: Reported on 07/12/2016 04/12/16   Houston SirenPeter Le, MD  NUCYNTA 100 MG TABS Take 100 mg by mouth 4 (four) times daily. Reported on 07/12/2016 03/08/16   Historical Provider, MD  predniSONE (DELTASONE) 10 MG tablet 5, 4, 3, 2 then 1 tablet by mouth daily for 5days total. 07/13/16   Burgess AmorJulie Idol, PA-C    Family History History reviewed. No pertinent family history.  Social History Social History  Substance Use Topics  . Smoking status: Current Every Day Smoker    Packs/day: 1.50    Types: Cigarettes  . Smokeless tobacco: Never Used  . Alcohol use No     Allergies   Other   Review of Systems Review of Systems  All other systems reviewed and are negative.    Physical Exam Updated Vital Signs BP 121/91 (BP Location: Left Arm)   Pulse 76   Temp 98.2 F (36.8 C) (Temporal)   Resp 18  Ht 5\' 10"  (1.778 m)   Wt 220 lb (99.8 kg)   SpO2 100%   BMI 31.57 kg/m   Physical Exam  Constitutional: He is oriented to person, place, and time. He appears well-developed and well-nourished.  HENT:  Head: Normocephalic and atraumatic.  Right Ear: External ear normal.  Left Ear: External ear normal.  Eyes: Conjunctivae and EOM are normal. Pupils are equal, round, and reactive to light.  Neck: Normal range of motion and phonation normal. Neck supple.  Cardiovascular: Normal rate.   Pulmonary/Chest: Effort normal. He exhibits no bony tenderness.  Musculoskeletal: Normal range of motion.  Neurological: He is alert and oriented to person, place, and time. No cranial nerve deficit or sensory deficit. He exhibits normal muscle  tone. Coordination normal.  Skin: Skin is warm, dry and intact.  Mild abrasions dorsal feet bilaterally. Left dorsal midfoot with mild swelling, and erythema, consistent with localized cellulitis. No proximal streaking. Normal range of motion both feet and ankles.  Psychiatric: He has a normal mood and affect. His behavior is normal. Judgment and thought content normal.  Nursing note and vitals reviewed.    ED Treatments / Results  Labs (all labs ordered are listed, but only abnormal results are displayed) Labs Reviewed - No data to display  EKG  EKG Interpretation None       Radiology No results found.  Procedures Procedures (including critical care time)  Medications Ordered in ED Medications - No data to display   Initial Impression / Assessment and Plan / ED Course  I have reviewed the triage vital signs and the nursing notes.  Pertinent labs & imaging results that were available during my care of the patient were reviewed by me and considered in my medical decision making (see chart for details).  Clinical Course    Medications - No data to display  Patient Vitals for the past 24 hrs:  BP Temp Temp src Pulse Resp SpO2 Height Weight  08/20/16 1330 121/91 98.2 F (36.8 C) Temporal 76 18 100 % 5\' 10"  (1.778 m) 220 lb (99.8 kg)    1:40 PM Reevaluation with update and discussion. After initial assessment and treatment, an updated evaluation reveals No change in clinical status. Findings discussed with patient and all questions answered. Rikita Grabert L    Final Clinical Impressions(s) / ED Diagnoses   Final diagnoses:  Cellulitis of foot     Localized cellulitis left foot, associated with abrasions, from material within, boots, at work. Discussed systemic or descending infection.  Nursing Notes Reviewed/ Care Coordinated Applicable Imaging Reviewed Interpretation of Laboratory Data incorporated into ED treatment  The patient appears reasonably screened  and/or stabilized for discharge and I doubt any other medical condition or other [facility] requiring further screening, evaluation, or treatment in the ED at this time prior to discharge.  Plan: Home Medications- continue; Home Treatments- rest, soaks; return here if the recommended treatment, does not improve the symptoms; Recommended follow up- return prn    New Prescriptions New Prescriptions   CLINDAMYCIN (CLEOCIN) 300 MG CAPSULE    Take 1 capsule (300 mg total) by mouth 4 (four) times daily. X 7 days     Mancel BaleElliott Terita Hejl, MD 08/20/16 1348

## 2016-09-03 ENCOUNTER — Encounter (HOSPITAL_COMMUNITY): Payer: Self-pay | Admitting: Emergency Medicine

## 2016-09-03 ENCOUNTER — Emergency Department (HOSPITAL_COMMUNITY)
Admission: EM | Admit: 2016-09-03 | Discharge: 2016-09-03 | Disposition: A | Discharge status: Home / Self Care | Payer: Medicaid Other | Attending: Emergency Medicine | Admitting: Emergency Medicine

## 2016-09-03 DIAGNOSIS — F17210 Nicotine dependence, cigarettes, uncomplicated: Secondary | ICD-10-CM | POA: Diagnosis not present

## 2016-09-03 DIAGNOSIS — Z23 Encounter for immunization: Secondary | ICD-10-CM | POA: Insufficient documentation

## 2016-09-03 DIAGNOSIS — T1502XA Foreign body in cornea, left eye, initial encounter: Secondary | ICD-10-CM | POA: Diagnosis present

## 2016-09-03 DIAGNOSIS — Y929 Unspecified place or not applicable: Secondary | ICD-10-CM | POA: Insufficient documentation

## 2016-09-03 DIAGNOSIS — T1592XA Foreign body on external eye, part unspecified, left eye, initial encounter: Secondary | ICD-10-CM

## 2016-09-03 DIAGNOSIS — Y939 Activity, unspecified: Secondary | ICD-10-CM | POA: Diagnosis not present

## 2016-09-03 DIAGNOSIS — X58XXXA Exposure to other specified factors, initial encounter: Secondary | ICD-10-CM | POA: Diagnosis not present

## 2016-09-03 DIAGNOSIS — Y999 Unspecified external cause status: Secondary | ICD-10-CM | POA: Diagnosis not present

## 2016-09-03 MED ORDER — FLUORESCEIN SODIUM 1 MG OP STRP
1.0000 | ORAL_STRIP | Freq: Once | OPHTHALMIC | Status: AC
  Administered 2016-09-03: 1 via OPHTHALMIC
  Filled 2016-09-03: qty 1

## 2016-09-03 MED ORDER — TETRACAINE HCL 0.5 % OP SOLN
1.0000 [drp] | Freq: Once | OPHTHALMIC | Status: AC
  Administered 2016-09-03: 1 [drp] via OPHTHALMIC
  Filled 2016-09-03: qty 4

## 2016-09-03 MED ORDER — TOBRAMYCIN 0.3 % OP SOLN
1.0000 [drp] | OPHTHALMIC | Status: DC
  Administered 2016-09-03: 1 [drp] via OPHTHALMIC
  Filled 2016-09-03: qty 5

## 2016-09-03 MED ORDER — TETANUS-DIPHTH-ACELL PERTUSSIS 5-2.5-18.5 LF-MCG/0.5 IM SUSP
0.5000 mL | Freq: Once | INTRAMUSCULAR | Status: AC
  Administered 2016-09-03: 0.5 mL via INTRAMUSCULAR

## 2016-09-03 MED ORDER — TETANUS-DIPHTH-ACELL PERTUSSIS 5-2.5-18.5 LF-MCG/0.5 IM SUSP
INTRAMUSCULAR | Status: AC
  Filled 2016-09-03: qty 0.5

## 2016-09-03 NOTE — ED Triage Notes (Signed)
Pt c/o having foreign body in the left eye this evening.

## 2016-09-03 NOTE — ED Notes (Signed)
Pt alert & oriented x4, stable gait. Patient given discharge instructions, paperwork & prescription(s). Patient  instructed to stop at the registration desk to finish any additional paperwork. Patient verbalized understanding. Pt left department w/ no further questions. 

## 2016-09-03 NOTE — ED Provider Notes (Signed)
[facility]-EMERGENCY DEPT Provider Note   CSN: [id number] Arrival date & time: 09/03/16  2015     History   Chief Complaint Chief Complaint  Patient presents with  . Foreign Body in Eye    HPI John [name] is a 40 y.o. male who presents to the ED with feeling of foreign body to the eye. Patient reports that his eye felt scratchy and he saw an area that looked like something in his eye. He took a Q-tip and tried to remove it but could not get it out. His eye continues to feel scratchy and have a foreign body sensation.   HPI  Past Medical History:  Diagnosis Date  . Anxiety   . Depression   . Testicular torsion     Patient Active Problem List   Diagnosis Date Noted  . CAP (community acquired pneumonia) 04/10/2016    Past Surgical History:  Procedure Laterality Date  . APPENDECTOMY    . EYE SURGERY    . NASAL SINUS SURGERY    . VASECTOMY      OB History    No data available       Home Medications    Prior to Admission medications   Medication Sig Start Date End Date Taking? Authorizing Provider  albuterol (PROVENTIL HFA;VENTOLIN HFA) 108 (90 Base) MCG/ACT inhaler Inhale 2 puffs into the lungs every 6 (six) hours as needed for wheezing or shortness of breath. 04/12/16   Houston SirenPeter Le, MD  benzonatate (TESSALON PERLES) 100 MG capsule Take 1 capsule (100 mg total) by mouth 3 (three) times daily as needed for cough. Patient not taking: Reported on 07/12/2016 04/10/16   Raeford RazorStephen Kohut, MD  Buprenorphine HCl-Naloxone HCl (SUBOXONE) 8-2 MG FILM Place 1 Film under the tongue 2 (two) times daily.    Historical Provider, MD  citalopram (CELEXA) 40 MG tablet Take 40 mg by mouth daily. 03/31/16   Historical Provider, MD  clindamycin (CLEOCIN) 300 MG capsule Take 1 capsule (300 mg total) by mouth 4 (four) times daily. X 7 days 08/20/16   Mancel BaleElliott Wentz, MD  dexamethasone (DECADRON) 4 MG tablet Take 1 tablet (4 mg total) by mouth 2 (two) times daily with a meal. Patient not taking: Reported on  07/12/2016 06/14/16   Ivery QualeHobson Bryant, PA-C  gabapentin (NEURONTIN) 300 MG capsule Take 600 mg by mouth 2 (two) times daily. *May take a max of 4500mg  daily. May take as needed. 04/04/16   Historical Provider, MD  levofloxacin (LEVAQUIN) 750 MG tablet Take 1 tablet (750 mg total) by mouth daily. Patient not taking: Reported on 07/12/2016 04/12/16   Houston SirenPeter Le, MD  NUCYNTA 100 MG TABS Take 100 mg by mouth 4 (four) times daily. Reported on 07/12/2016 03/08/16   Historical Provider, MD  predniSONE (DELTASONE) 10 MG tablet 5, 4, 3, 2 then 1 tablet by mouth daily for 5days total. 07/13/16   Burgess AmorJulie Idol, PA-C    Family History History reviewed. No pertinent family history.  Social History Social History  Substance Use Topics  . Smoking status: Current Every Day Smoker    Packs/day: 1.50    Types: Cigarettes  . Smokeless tobacco: Never Used  . Alcohol use No     Allergies   Other   Review of Systems Review of Systems  Eyes: Positive for photophobia and redness.       ? Foreign body  All other systems reviewed and are negative.    Physical Exam Updated Vital Signs BP 113/83 (BP Location: Left Arm)  Pulse 83   Temp 98.7 F (37.1 C) (Oral)   Resp 18   Ht 5\' 10"  (1.778 m)   Wt 99.8 kg   SpO2 98%   BMI 31.57 kg/m   Physical Exam  Constitutional: He is oriented to person, place, and time. He appears well-developed and well-nourished. No distress.  HENT:  Head: Normocephalic.  Eyes: EOM are normal. Pupils are equal, round, and reactive to light. Lids are everted and swept, no foreign bodies found.  Fundoscopic exam:      The left eye shows no exudate and no hemorrhage.  Slit lamp exam:      The left eye shows foreign body. The left eye shows no fluorescein uptake.    There is a tiny dark area on the left sclera at 11 o'clock. Attempt to remove FB by using sterile applicator stick without success.  Neck: Neck supple.  Cardiovascular: Normal rate.   Pulmonary/Chest: Effort normal.    Abdominal: Soft. There is no tenderness.  Musculoskeletal: Normal range of motion.  Neurological: He is alert and oriented to person, place, and time. No cranial nerve deficit.  Skin: Skin is warm and dry.  Psychiatric: He has a normal mood and affect. His behavior is normal.  Nursing note and vitals reviewed.    ED Treatments / Results  Labs (all labs ordered are listed, but only abnormal results are displayed) Labs Reviewed - No data to display  Radiology No results found.  Procedures Procedures (including critical care time)  Medications Ordered in ED Medications  tetracaine (PONTOCAINE) 0.5 % ophthalmic solution 1 drop (1 drop Left Eye Given 09/03/16 2235)  fluorescein ophthalmic strip 1 strip (1 strip Left Eye Given 09/03/16 2236)  Tdap (BOOSTRIX) injection 0.5 mL (0.5 mLs Intramuscular Given 09/03/16 2237)     Initial Impression / Assessment and Plan / ED Course  I have reviewed the triage vital signs and the nursing notes.   Clinical Course   Dr. Adriana Simas in to examine the patient with slit lamp.  Final Clinical Impressions(s) / ED Diagnoses  40 y.o. male with irritation and foreign body sensation of the left eye stable for d/c to f/u with Dr. Lita Mains for further evaluation. Will treat with antibiotic eye drops with fist dose of tobramycin prior to d/c.  Final diagnoses:  Foreign body, eye, left, initial encounter    New Prescriptions Discharge Medication List as of 09/03/2016 10:32 PM       Janne Napoleon, NP 09/06/16 1610    Donnetta Hutching, MD 09/06/16 2239

## 2016-09-03 NOTE — ED Notes (Signed)
Pt states he wore his contacts today, after removing started having pain to the left eye. Says he noticed a black spot on that eye.

## 2016-09-03 NOTE — Discharge Instructions (Signed)
You will need to see Dr. Lita MainsHaines in the morning for further evaluation of your left eye. Call the office in the morning and tell them you were seen in the ED for a foreign body to the left eye and need follow up to remove the foreign body. Take tylenol and ibuprofen for discomfort.

## 2016-10-31 ENCOUNTER — Encounter (HOSPITAL_COMMUNITY): Payer: Self-pay | Admitting: Emergency Medicine

## 2016-10-31 ENCOUNTER — Emergency Department (HOSPITAL_COMMUNITY)
Admission: EM | Admit: 2016-10-31 | Discharge: 2016-10-31 | Disposition: A | Discharge status: Home / Self Care | Payer: Medicaid Other | Attending: Emergency Medicine | Admitting: Emergency Medicine

## 2016-10-31 ENCOUNTER — Emergency Department (HOSPITAL_COMMUNITY): Payer: Medicaid Other

## 2016-10-31 DIAGNOSIS — J40 Bronchitis, not specified as acute or chronic: Secondary | ICD-10-CM | POA: Diagnosis not present

## 2016-10-31 DIAGNOSIS — Z79899 Other long term (current) drug therapy: Secondary | ICD-10-CM | POA: Diagnosis not present

## 2016-10-31 DIAGNOSIS — R0602 Shortness of breath: Secondary | ICD-10-CM | POA: Diagnosis present

## 2016-10-31 DIAGNOSIS — F17210 Nicotine dependence, cigarettes, uncomplicated: Secondary | ICD-10-CM | POA: Insufficient documentation

## 2016-10-31 LAB — CBC WITH DIFFERENTIAL/PLATELET
BASOS ABS: 0.1 10*3/uL (ref 0.0–0.1)
Basophils Relative: 0 %
EOS PCT: 3 %
Eosinophils Absolute: 0.6 10*3/uL (ref 0.0–0.7)
HEMATOCRIT: 39.9 % (ref 39.0–52.0)
Hemoglobin: 13.7 g/dL (ref 13.0–17.0)
LYMPHS ABS: 1.5 10*3/uL (ref 0.7–4.0)
LYMPHS PCT: 8 %
MCH: 29.9 pg (ref 26.0–34.0)
MCHC: 34.3 g/dL (ref 30.0–36.0)
MCV: 87.1 fL (ref 78.0–100.0)
Monocytes Absolute: 1.1 10*3/uL — ABNORMAL HIGH (ref 0.1–1.0)
Monocytes Relative: 6 %
NEUTROS ABS: 14.8 10*3/uL — AB (ref 1.7–7.7)
Neutrophils Relative %: 83 %
Platelets: 241 10*3/uL (ref 150–400)
RBC: 4.58 MIL/uL (ref 4.22–5.81)
RDW: 13 % (ref 11.5–15.5)
WBC: 18 10*3/uL — ABNORMAL HIGH (ref 4.0–10.5)

## 2016-10-31 LAB — BASIC METABOLIC PANEL
ANION GAP: 8 (ref 5–15)
BUN: 13 mg/dL (ref 6–20)
CALCIUM: 8.8 mg/dL — AB (ref 8.9–10.3)
CO2: 22 mmol/L (ref 22–32)
Chloride: 104 mmol/L (ref 101–111)
Creatinine, Ser: 0.84 mg/dL (ref 0.61–1.24)
GFR calc Af Amer: >60 mL/min (ref >60)
GFR calc non Af Amer: >60 mL/min (ref >60)
GLUCOSE: 117 mg/dL — AB (ref 65–99)
POTASSIUM: 4.1 mmol/L (ref 3.5–5.1)
Sodium: 134 mmol/L — ABNORMAL LOW (ref 135–145)

## 2016-10-31 MED ORDER — IPRATROPIUM-ALBUTEROL 0.5-2.5 (3) MG/3ML IN SOLN
3.0000 mL | Freq: Once | RESPIRATORY_TRACT | Status: AC
  Administered 2016-10-31: 3 mL via RESPIRATORY_TRACT
  Filled 2016-10-31: qty 3

## 2016-10-31 MED ORDER — ALBUTEROL SULFATE (2.5 MG/3ML) 0.083% IN NEBU
2.5000 mg | INHALATION_SOLUTION | Freq: Once | RESPIRATORY_TRACT | Status: AC
  Administered 2016-10-31: 2.5 mg via RESPIRATORY_TRACT
  Filled 2016-10-31: qty 3

## 2016-10-31 MED ORDER — AZITHROMYCIN 250 MG PO TABS: 250.0000 mg | ORAL_TABLET | Freq: Every day | ORAL | 0 refills | Status: DC

## 2016-10-31 MED ORDER — ALBUTEROL SULFATE HFA 108 (90 BASE) MCG/ACT IN AERS: 2.0000 | INHALATION_SPRAY | RESPIRATORY_TRACT | 0 refills | Status: DC | PRN

## 2016-10-31 MED ORDER — AMOXICILLIN 500 MG PO CAPS: 500.0000 mg | ORAL_CAPSULE | Freq: Three times a day (TID) | ORAL | 0 refills | Status: DC

## 2016-10-31 MED ORDER — IBUPROFEN 800 MG PO TABS
800.0000 mg | ORAL_TABLET | Freq: Once | ORAL | Status: AC
  Administered 2016-10-31: 800 mg via ORAL
  Filled 2016-10-31: qty 1

## 2016-10-31 MED ORDER — PREDNISONE 10 MG PO TABS: 50.0000 mg | ORAL_TABLET | Freq: Every day | ORAL | 0 refills | Status: AC

## 2016-10-31 NOTE — ED Triage Notes (Signed)
Pt reports cough x 5 days with SOB. Pt states he has "coughed himself unconscious x 2 today." Pt states the cough is non productive.

## 2016-10-31 NOTE — ED Provider Notes (Signed)
[facility] Provider Note   CSN: [id number] Arrival date & time: 10/31/16  1657     History   Chief Complaint Chief Complaint  Patient presents with  . Shortness of Breath    HPI John [name] is a 40 y.o. male.  HPI 40 year old male who presents with cough, wheezing, and shortness of breath. States having coughing fits and he had syncope associated with it today for a few seconds. States cough for 5 days, occasional sputum. States he feels something in back of throat but unable to cough it up. Associated with generalized. No known sick contacts. States low grade fever. No vomiting or diarrhea. No history of asthma or COPD. Uses tobacco. States he was hospitalized 03/2016 for CAP and this feels very similar. Tried taking inhaler at home, but did not have improved breathing.    Past Medical History:  Diagnosis Date  . Anxiety   . Depression   . Testicular torsion     Patient Active Problem List   Diagnosis Date Noted  . CAP (community acquired pneumonia) 04/10/2016    Past Surgical History:  Procedure Laterality Date  . APPENDECTOMY    . EYE SURGERY    . NASAL SINUS SURGERY    . VASECTOMY      OB History    No data available       Home Medications    Prior to Admission medications   Medication Sig Start Date End Date Taking? Authorizing Provider  albuterol (PROVENTIL HFA;VENTOLIN HFA) 108 (90 Base) MCG/ACT inhaler Inhale 2 puffs into the lungs every 6 (six) hours as needed for wheezing or shortness of breath. 04/12/16  Yes Houston Siren, MD  Buprenorphine HCl-Naloxone HCl (SUBOXONE) 8-2 MG FILM Place 1 Film under the tongue 2 (two) times daily.   Yes Historical Provider, MD  gabapentin (NEURONTIN) 300 MG capsule Take 1,800 mg by mouth 2 (two) times daily. *May take a max of 4500mg  daily. May take as needed. 04/04/16  Yes Historical Provider, MD  albuterol (PROVENTIL HFA;VENTOLIN HFA) 108 (90 Base) MCG/ACT inhaler Inhale 2 puffs into the lungs every 4 (four) hours  as needed for wheezing or shortness of breath. 10/31/16   Lavera Guise, MD  amoxicillin (AMOXIL) 500 MG capsule Take 1 capsule (500 mg total) by mouth 3 (three) times daily. 10/31/16   Lavera Guise, MD  azithromycin (ZITHROMAX) 250 MG tablet Take 1 tablet (250 mg total) by mouth daily. Take first 2 tablets together, then 1 every day until finished. 10/31/16   Lavera Guise, MD  predniSONE (DELTASONE) 10 MG tablet Take 5 tablets (50 mg total) by mouth daily. 10/31/16 11/04/16  Lavera Guise, MD    Family History Family History  Problem Relation Age of Onset  . Diabetes Mother   . Hypertension Father   . Cancer Other     Social History Social History  Substance Use Topics  . Smoking status: Current Every Day Smoker    Packs/day: 1.50    Types: Cigarettes  . Smokeless tobacco: Never Used  . Alcohol use No     Allergies   Other   Review of Systems Review of Systems 10/14 systems reviewed and are negative other than those stated in the HPI   Physical Exam Updated Vital Signs BP 110/64 (BP Location: Right Arm)   Pulse 80   Temp 99 F (37.2 C) (Oral)   Resp 19   Ht 5\' 10"  (1.778 m)   Wt 220 lb (99.8  kg)   SpO2 94%   BMI 31.57 kg/m   Physical Exam Physical Exam  Nursing note and vitals reviewed. Constitutional: Well developed, well nourished, non-toxic, and in no acute distress Head: Normocephalic and atraumatic.  Mouth/Throat: Oropharynx is clear and moist.  Neck: Normal range of motion. Neck supple.  Cardiovascular: Normal rate and regular rhythm.   Pulmonary/Chest: Effort normal. Speaks in full sentences. Bronchospastic cough with expiratory wheezes. Abdominal: Soft. There is no tenderness. There is no rebound and no guarding.  Musculoskeletal: Normal range of motion. no lower extremity edema or calf tenderness Neurological: Alert, no facial droop, fluent speech, moves all extremities symmetrically Skin: Skin is warm and dry.  Psychiatric: Cooperative   ED  Treatments / Results  Labs (all labs ordered are listed, but only abnormal results are displayed) Labs Reviewed  CBC WITH DIFFERENTIAL/PLATELET - Abnormal; Notable for the following:       Result Value   WBC 18.0 (*)    Neutro Abs 14.8 (*)    Monocytes Absolute 1.1 (*)    All other components within normal limits  BASIC METABOLIC PANEL - Abnormal; Notable for the following:    Sodium 134 (*)    Glucose, Bld 117 (*)    Calcium 8.8 (*)    All other components within normal limits    EKG  EKG Interpretation None       Radiology Dg Chest 2 View  Result Date: 10/31/2016 CLINICAL DATA:  Cough for the past 5 days.  Shortness of breath. EXAM: CHEST  2 VIEW COMPARISON:  04/10/2016. FINDINGS: Normal sized heart. Clear lungs. Minimal diffuse peribronchial thickening. Minimal thoracic spine degenerative changes. IMPRESSION: Minimal bronchitic changes. Electronically Signed   By: Beckie SaltsSteven  Reid M.D.   On: 10/31/2016 18:48    Procedures Procedures (including critical care time)  Medications Ordered in ED Medications  ipratropium-albuterol (DUONEB) 0.5-2.5 (3) MG/3ML nebulizer solution 3 mL (3 mLs Nebulization Given 10/31/16 1807)  ibuprofen (ADVIL,MOTRIN) tablet 800 mg (800 mg Oral Given 10/31/16 1805)  albuterol (PROVENTIL) (2.5 MG/3ML) 0.083% nebulizer solution 2.5 mg (2.5 mg Nebulization Given 10/31/16 1948)     Initial Impression / Assessment and Plan / ED Course  I have reviewed the triage vital signs and the nursing notes.  Pertinent labs & imaging results that were available during my care of the patient were reviewed by me and considered in my medical decision making (see chart for details).  Clinical Course   40 year old male who presents with 5 days worsening cough, congestion, shortness of breath. Afebrile, hemodynamically stable, normal oxygenation on room air with normal work of breathing, Wheezing and bronchospastic cough on exam, improved after 2 breathing treatments in  ED. CXR visualized and w/o infiltrate but bronchitic changes suggestive of bronchitis. He does have leukocytosis of 18 on blood work. Will treat for potential CAP given significant leukocytosis; although still can be viral bronchitis. Strict return and follow-up instructions reviewed. He expressed understanding of all discharge instructions and felt comfortable with the plan of care.    Final Clinical Impressions(s) / ED Diagnoses   Final diagnoses:  Bronchitis    New Prescriptions Discharge Medication List as of 10/31/2016  7:43 PM    START taking these medications   Details  !! albuterol (PROVENTIL HFA;VENTOLIN HFA) 108 (90 Base) MCG/ACT inhaler Inhale 2 puffs into the lungs every 4 (four) hours as needed for wheezing or shortness of breath., Starting Thu 10/31/2016, Print    amoxicillin (AMOXIL) 500 MG capsule Take 1  capsule (500 mg total) by mouth 3 (three) times daily., Starting Thu 10/31/2016, Print    azithromycin (ZITHROMAX) 250 MG tablet Take 1 tablet (250 mg total) by mouth daily. Take first 2 tablets together, then 1 every day until finished., Starting Thu 10/31/2016, Print    predniSONE (DELTASONE) 10 MG tablet Take 5 tablets (50 mg total) by mouth daily., Starting Thu 10/31/2016, Until Mon 11/04/2016, Print     !! - Potential duplicate medications found. Please discuss with provider.       Lavera Guiseana Duo Lydia Toren, MD 10/31/16 [id number]

## 2016-10-31 NOTE — Discharge Instructions (Signed)
Please take medications as prescribed.  Return without fail for worsening symptoms, including persistent fevers, difficulty breathing, passing out, or any other symptoms concerning to you.

## 2016-11-12 ENCOUNTER — Encounter (HOSPITAL_COMMUNITY): Payer: Self-pay

## 2016-11-12 ENCOUNTER — Emergency Department (HOSPITAL_COMMUNITY): Payer: Medicaid Other

## 2016-11-12 ENCOUNTER — Emergency Department (HOSPITAL_COMMUNITY)
Admission: EM | Admit: 2016-11-12 | Discharge: 2016-11-12 | Disposition: A | Discharge status: Home / Self Care | Payer: Medicaid Other | Attending: Emergency Medicine | Admitting: Emergency Medicine

## 2016-11-12 DIAGNOSIS — R109 Unspecified abdominal pain: Secondary | ICD-10-CM | POA: Insufficient documentation

## 2016-11-12 DIAGNOSIS — F17210 Nicotine dependence, cigarettes, uncomplicated: Secondary | ICD-10-CM | POA: Diagnosis not present

## 2016-11-12 DIAGNOSIS — R0789 Other chest pain: Secondary | ICD-10-CM | POA: Insufficient documentation

## 2016-11-12 DIAGNOSIS — R079 Chest pain, unspecified: Secondary | ICD-10-CM | POA: Diagnosis present

## 2016-11-12 LAB — TROPONIN I

## 2016-11-12 LAB — CBC WITH DIFFERENTIAL/PLATELET
Basophils Absolute: 0.1 10*3/uL (ref 0.0–0.1)
Basophils Relative: 1 %
EOS PCT: 6 %
Eosinophils Absolute: 0.6 10*3/uL (ref 0.0–0.7)
HCT: 42.7 % (ref 39.0–52.0)
Hemoglobin: 14.2 g/dL (ref 13.0–17.0)
LYMPHS ABS: 2.8 10*3/uL (ref 0.7–4.0)
LYMPHS PCT: 28 %
MCH: 29.8 pg (ref 26.0–34.0)
MCHC: 33.3 g/dL (ref 30.0–36.0)
MCV: 89.5 fL (ref 78.0–100.0)
MONO ABS: 0.7 10*3/uL (ref 0.1–1.0)
MONOS PCT: 7 %
Neutro Abs: 5.9 10*3/uL (ref 1.7–7.7)
Neutrophils Relative %: 58 %
PLATELETS: 236 10*3/uL (ref 150–400)
RBC: 4.77 MIL/uL (ref 4.22–5.81)
RDW: 13 % (ref 11.5–15.5)
WBC: 10.1 10*3/uL (ref 4.0–10.5)

## 2016-11-12 LAB — COMPREHENSIVE METABOLIC PANEL
ALK PHOS: 92 U/L (ref 38–126)
ALT: 38 U/L (ref 17–63)
ANION GAP: 6 (ref 5–15)
AST: 22 U/L (ref 15–41)
Albumin: 4.1 g/dL (ref 3.5–5.0)
BUN: 17 mg/dL (ref 6–20)
CALCIUM: 8.8 mg/dL — AB (ref 8.9–10.3)
CHLORIDE: 104 mmol/L (ref 101–111)
CO2: 28 mmol/L (ref 22–32)
CREATININE: 0.97 mg/dL (ref 0.61–1.24)
Glucose, Bld: 152 mg/dL — ABNORMAL HIGH (ref 65–99)
Potassium: 3.8 mmol/L (ref 3.5–5.1)
Sodium: 138 mmol/L (ref 135–145)
Total Bilirubin: 0.6 mg/dL (ref 0.3–1.2)
Total Protein: 7.3 g/dL (ref 6.5–8.1)

## 2016-11-12 LAB — LIPASE, BLOOD: LIPASE: 18 U/L (ref 11–51)

## 2016-11-12 MED ORDER — OMEPRAZOLE 20 MG PO CPDR: 20.0000 mg | DELAYED_RELEASE_CAPSULE | Freq: Every day | ORAL | 0 refills | Status: DC

## 2016-11-12 MED ORDER — GI COCKTAIL ~~LOC~~
30.0000 mL | Freq: Once | ORAL | Status: AC
  Administered 2016-11-12: 30 mL via ORAL
  Filled 2016-11-12: qty 30

## 2016-11-12 NOTE — ED Triage Notes (Signed)
Pt reports he was watching TV and all of a sudden got stomach cramps, chest/axilla pain, and left arm tingling. Pt reports taking a [place] prior to [place] [facility]

## 2016-11-12 NOTE — Discharge Instructions (Signed)
Nursing today for chest and abdominal pain. Your workup is reassuring. This may be related to reflux. He'll be started on a daily acid reducer. If symptoms persist or worsen you need to be reevaluated. He will be provided with both GI and cardiology follow-up.

## 2016-11-12 NOTE — ED Provider Notes (Signed)
[facility] Provider Note   CSN: [id number] Arrival date & time: 11/12/16  0230     History   Chief Complaint Chief Complaint  Patient presents with  . Chest Pain    HPI John [name] is a 40 y.o. male.  HPI  This is a 40 year old male with a history of bronchitis who presents with chest pain. Patient reports onset of chest pain and stomach cramping approximately 30 minutes prior to arrival. He was watching TV. Denies any nausea or vomiting. Denies any association with food. He describes the pain as crampy and sharp at times. Comes and goes. Currently his pain is 0 out of 10. He took [place] approximately 40 minutes prior to pain starting. Pain does not radiate. Denies any shortness of breath or fevers. Denies any cough.  Past Medical History:  Diagnosis Date  . Anxiety   . Depression   . Testicular torsion     Patient Active Problem List   Diagnosis Date Noted  . CAP (community acquired pneumonia) 04/10/2016    Past Surgical History:  Procedure Laterality Date  . APPENDECTOMY    . EYE SURGERY    . NASAL SINUS SURGERY    . VASECTOMY      OB History    No data available       Home Medications    Prior to Admission medications   Medication Sig Start Date End Date Taking? Authorizing Provider  tapentadol (NUCYNTA) 50 MG tablet Take 50 mg by mouth.   Yes Historical Provider, MD  albuterol (PROVENTIL HFA;VENTOLIN HFA) 108 (90 Base) MCG/ACT inhaler Inhale 2 puffs into the lungs every 6 (six) hours as needed for wheezing or shortness of breath. 04/12/16   Houston SirenPeter Le, MD  albuterol (PROVENTIL HFA;VENTOLIN HFA) 108 (90 Base) MCG/ACT inhaler Inhale 2 puffs into the lungs every 4 (four) hours as needed for wheezing or shortness of breath. 10/31/16   Lavera Guiseana Duo Liu, MD  amoxicillin (AMOXIL) 500 MG capsule Take 1 capsule (500 mg total) by mouth 3 (three) times daily. 10/31/16   Lavera Guiseana Duo Liu, MD  azithromycin (ZITHROMAX) 250 MG tablet Take 1 tablet (250 mg total) by mouth  daily. Take first 2 tablets together, then 1 every day until finished. 10/31/16   Lavera Guiseana Duo Liu, MD  Buprenorphine HCl-Naloxone HCl (SUBOXONE) 8-2 MG FILM Place 1 Film under the tongue 2 (two) times daily.    Historical Provider, MD  gabapentin (NEURONTIN) 300 MG capsule Take 1,800 mg by mouth 2 (two) times daily. *May take a max of 4500mg  daily. May take as needed. 04/04/16   Historical Provider, MD  omeprazole (PRILOSEC) 20 MG capsule Take 1 capsule (20 mg total) by mouth daily. 11/12/16   Shon Batonourtney F Alizea Pell, MD    Family History Family History  Problem Relation Age of Onset  . Diabetes Mother   . Hypertension Father   . Cancer Other     Social History Social History  Substance Use Topics  . Smoking status: Current Every Day Smoker    Packs/day: 1.50    Types: Cigarettes  . Smokeless tobacco: Never Used  . Alcohol use No     Allergies   No known allergies and Other   Review of Systems Review of Systems  Constitutional: Negative for fever.  Respiratory: Negative for cough and shortness of breath.   Cardiovascular: Positive for chest pain.  Gastrointestinal: Positive for abdominal pain. Negative for diarrhea, nausea and vomiting.  All other systems reviewed and are negative.  Physical Exam Updated Vital Signs BP 156/78   Pulse 96   Temp 98.2 F (36.8 C) (Oral)   Resp 18   Ht 5\' 10"  (1.778 m)   Wt 220 lb (99.8 kg)   SpO2 99%   BMI 31.57 kg/m   Physical Exam  Constitutional: He is oriented to person, place, and time. He appears well-developed and well-nourished. No distress.  HENT:  Head: Normocephalic and atraumatic.  Cardiovascular: Normal rate, regular rhythm and normal heart sounds.   No murmur heard. Pulmonary/Chest: Effort normal and breath sounds normal. No respiratory distress. He has no wheezes.  Abdominal: Soft. Bowel sounds are normal. There is no tenderness. There is no rebound.  Musculoskeletal: He exhibits no edema.  Neurological: He is alert and  oriented to person, place, and time.  Skin: Skin is warm and dry.  Psychiatric: He has a normal mood and affect.  Nursing note and vitals reviewed.    ED Treatments / Results  Labs (all labs ordered are listed, but only abnormal results are displayed) Labs Reviewed  COMPREHENSIVE METABOLIC PANEL - Abnormal; Notable for the following:       Result Value   Glucose, Bld 152 (*)    Calcium 8.8 (*)    All other components within normal limits  CBC WITH DIFFERENTIAL/PLATELET  LIPASE, BLOOD  TROPONIN I    EKG  EKG Interpretation  Date/Time:  Tuesday November 12 2016 02:32:48 EST Ventricular Rate:  93 PR Interval:    QRS Duration: 99 QT Interval:  362 QTC Calculation: 451 R Axis:   19 Text Interpretation:  Sinus rhythm Confirmed by Wilkie Aye  MD, Toni Amend ([id number]) on 11/12/2016 3:32:18 AM       Radiology Dg Chest 2 View  Result Date: 11/12/2016 CLINICAL DATA:  Acute onset of epigastric cramping, chest and axillary pain, and left arm tingling. Initial encounter. EXAM: CHEST  2 VIEW COMPARISON:  Chest radiograph performed 10/31/2016 FINDINGS: The lungs are well-aerated. Mild vascular congestion is noted. Peribronchial thickening is seen. There is no evidence of focal opacification, pleural effusion or pneumothorax. The heart is normal in size; the mediastinal contour is within normal limits. No acute osseous abnormalities are seen. IMPRESSION: Mild vascular congestion noted.  Peribronchial thickening seen. Electronically Signed   By: Roanna Raider M.D.   On: 11/12/2016 03:24    Procedures Procedures (including critical care time)  Medications Ordered in ED Medications  gi cocktail (Maalox,Lidocaine,Donnatal) (30 mLs Oral Given 11/12/16 0251)     Initial Impression / Assessment and Plan / ED Course  I have reviewed the triage vital signs and the nursing notes.  Pertinent labs & imaging results that were available during my care of the patient were reviewed by me and  considered in my medical decision making (see chart for details).  Clinical Course     Patient presents with chest and abdominal discomfort. Nontoxic on exam. Vital signs reassuring. Mildly hypertensive but no history. He is a smoker. EKG is normal. Troponin and chest x-ray are reassuring. He does show evidence of peribronchial thickening but no clinical evidence of bronchitis at this time. Given abdominal cramping, GERD is also consideration. Patient was given a GI cocktail. On recheck, he reports "significant" improvement of his symptoms. Given reassuring workup, I feel patient is cleared for discharge. Will discharge with a PPI. Follow-up with cardiology and gastroenterology provided.  After history, exam, and medical workup I feel the patient has been appropriately medically screened and is safe for discharge home. Pertinent diagnoses were  discussed with the patient. Patient was given return precautions.   Final Clinical Impressions(s) / ED Diagnoses   Final diagnoses:  Atypical chest pain    New Prescriptions New Prescriptions   OMEPRAZOLE (PRILOSEC) 20 MG CAPSULE    Take 1 capsule (20 mg total) by mouth daily.     Shon Batonourtney F Derrin Currey, MD 11/12/16 [phone]

## 2017-02-17 ENCOUNTER — Emergency Department (HOSPITAL_COMMUNITY): Payer: Medicaid Other

## 2017-02-17 ENCOUNTER — Emergency Department (HOSPITAL_COMMUNITY)
Admission: EM | Admit: 2017-02-17 | Discharge: 2017-02-17 | Disposition: A | Discharge status: Home / Self Care | Payer: Medicaid Other | Attending: Emergency Medicine | Admitting: Emergency Medicine

## 2017-02-17 ENCOUNTER — Encounter (HOSPITAL_COMMUNITY): Payer: Self-pay | Admitting: Emergency Medicine

## 2017-02-17 DIAGNOSIS — J40 Bronchitis, not specified as acute or chronic: Secondary | ICD-10-CM | POA: Diagnosis not present

## 2017-02-17 DIAGNOSIS — F17210 Nicotine dependence, cigarettes, uncomplicated: Secondary | ICD-10-CM | POA: Diagnosis not present

## 2017-02-17 DIAGNOSIS — R05 Cough: Secondary | ICD-10-CM | POA: Diagnosis present

## 2017-02-17 HISTORY — DX: Pneumonia, unspecified organism: J18.9

## 2017-02-17 HISTORY — DX: Bronchitis, not specified as acute or chronic: J40

## 2017-02-17 MED ORDER — PREDNISONE 10 MG PO TABS: 20.0000 mg | ORAL_TABLET | Freq: Every day | ORAL | 0 refills | Status: DC

## 2017-02-17 MED ORDER — ALBUTEROL SULFATE HFA 108 (90 BASE) MCG/ACT IN AERS
2.0000 | INHALATION_SPRAY | RESPIRATORY_TRACT | Status: DC | PRN
  Administered 2017-02-17: 2 via RESPIRATORY_TRACT
  Filled 2017-02-17: qty 6.7

## 2017-02-17 MED ORDER — PREDNISONE 50 MG PO TABS
60.0000 mg | ORAL_TABLET | Freq: Once | ORAL | Status: AC
  Administered 2017-02-17: 60 mg via ORAL
  Filled 2017-02-17: qty 1

## 2017-02-17 MED ORDER — AEROCHAMBER PLUS FLO-VU MEDIUM MISC
1.0000 | Freq: Once | Status: AC
  Administered 2017-02-17: 1
  Filled 2017-02-17: qty 1

## 2017-02-17 MED ORDER — CLINDAMYCIN HCL 150 MG PO CAPS: 150.0000 mg | ORAL_CAPSULE | Freq: Four times a day (QID) | ORAL | 0 refills | Status: DC

## 2017-02-17 NOTE — ED Triage Notes (Signed)
Patient complaining of cough x 1 week. States he is using inhaler with little relief.

## 2017-02-17 NOTE — ED Provider Notes (Signed)
[facility] DEPT Provider Note   CSN: [id number] Arrival date & time: 02/17/17  1947     History   Chief Complaint Chief Complaint  Patient presents with  . Cough    HPI John [name] is a 41 y.o. male.  HPI 41 year old man history of bronchitis, pneumonia, and smoker who presents today with one-week history of coughing. He states that he has some intermittent production of sputum but does not know what color it is. He has not had fever or chills. He has some mild dyspnea. He states that he has had recurrent bronchitis and usually gets about twice yearly. He states he normally is treated with clindamycin, prednisone, and albuterol. He has continued to smoke. He denies any other symptoms. He denies headache, sore throat, abdominal pain, nausea, vomiting, or diarrhea. He has been taking by mouth without difficulty. Past Medical History:  Diagnosis Date  . Anxiety   . Bronchitis   . Depression   . Pneumonia   . Testicular torsion     Patient Active Problem List   Diagnosis Date Noted  . CAP (community acquired pneumonia) 04/10/2016    Past Surgical History:  Procedure Laterality Date  . APPENDECTOMY    . EYE SURGERY    . NASAL SINUS SURGERY    . VASECTOMY      OB History    No data available       Home Medications    Prior to Admission medications   Medication Sig Start Date End Date Taking? Authorizing Provider  albuterol (PROVENTIL HFA;VENTOLIN HFA) 108 (90 Base) MCG/ACT inhaler Inhale 2 puffs into the lungs every 6 (six) hours as needed for wheezing or shortness of breath. 04/12/16   Houston Siren, MD  albuterol (PROVENTIL HFA;VENTOLIN HFA) 108 (90 Base) MCG/ACT inhaler Inhale 2 puffs into the lungs every 4 (four) hours as needed for wheezing or shortness of breath. 10/31/16   Lavera Guise, MD  amoxicillin (AMOXIL) 500 MG capsule Take 1 capsule (500 mg total) by mouth 3 (three) times daily. 10/31/16   Lavera Guise, MD  azithromycin (ZITHROMAX) 250 MG tablet Take 1  tablet (250 mg total) by mouth daily. Take first 2 tablets together, then 1 every day until finished. 10/31/16   Lavera Guise, MD  Buprenorphine HCl-Naloxone HCl (SUBOXONE) 8-2 MG FILM Place 1 Film under the tongue 2 (two) times daily.    Historical Provider, MD  gabapentin (NEURONTIN) 300 MG capsule Take 1,800 mg by mouth 2 (two) times daily. *May take a max of 4500mg  daily. May take as needed. 04/04/16   Historical Provider, MD  omeprazole (PRILOSEC) 20 MG capsule Take 1 capsule (20 mg total) by mouth daily. 11/12/16   Shon Baton, MD  tapentadol (NUCYNTA) 50 MG tablet Take 50 mg by mouth.    Historical Provider, MD    Family History Family History  Problem Relation Age of Onset  . Diabetes Mother   . Hypertension Father   . Cancer Other     Social History Social History  Substance Use Topics  . Smoking status: Current Every Day Smoker    Packs/day: 1.50    Types: Cigarettes  . Smokeless tobacco: Never Used  . Alcohol use No     Allergies   No known allergies and Other   Review of Systems Review of Systems  All other systems reviewed and are negative.    Physical Exam Updated Vital Signs BP 147/85 (BP Location: Right Arm)   Pulse 74  Temp 98.9 F (37.2 C) (Oral)   Resp 20   Ht 5\' 10"  (1.778 m)   Wt 99.8 kg   SpO2 97%   BMI 31.57 kg/m   Physical Exam  Constitutional: He appears well-developed and well-nourished. No distress.  HENT:  Head: Normocephalic and atraumatic.  Right Ear: External ear normal.  Left Ear: External ear normal.  Nose: Nose normal.  Mouth/Throat: Oropharynx is clear and moist.  Eyes: EOM are normal. Pupils are equal, round, and reactive to light.  Neck: Normal range of motion. Neck supple.  Cardiovascular: Normal rate.   Pulmonary/Chest: Effort normal.  Diffuse rhonchi with some expiratory wheezes  Abdominal: Soft. Bowel sounds are normal.  Musculoskeletal: Normal range of motion.  Skin: Skin is warm. Capillary refill takes less  than 2 seconds.  Psychiatric: He has a normal mood and affect.  Nursing note and vitals reviewed.    ED Treatments / Results  Labs (all labs ordered are listed, but only abnormal results are displayed) Labs Reviewed - No data to display  EKG  EKG Interpretation None       Radiology Dg Chest 2 View  Result Date: 02/17/2017 CLINICAL DATA:  41 y/o  M; cough. EXAM: CHEST  2 VIEW COMPARISON:  None. FINDINGS: Stable heart size and mediastinal contours are within normal limits. Both lungs are clear. The visualized skeletal structures are unremarkable. IMPRESSION: No active cardiopulmonary disease. Electronically Signed   By: Mitzi HansenLance  Furusawa-Stratton M.D.   On: 02/17/2017 20:30    Procedures Procedures (including critical care time)  Medications Ordered in ED Medications  albuterol (PROVENTIL HFA;VENTOLIN HFA) 108 (90 Base) MCG/ACT inhaler 2 puff (not administered)  AEROCHAMBER PLUS FLO-VU MEDIUM MISC 1 each (not administered)  predniSONE (DELTASONE) tablet 60 mg (not administered)     Initial Impression / Assessment and Plan / ED Course  I have reviewed the triage vital signs and the nursing notes.  Pertinent labs & imaging results that were available during my care of the patient were reviewed by me and considered in my medical decision making (see chart for details).      Final Clinical Impressions(s) / ED Diagnoses   Final diagnoses:  Bronchitis    New Prescriptions New Prescriptions   CLINDAMYCIN (CLEOCIN) 150 MG CAPSULE    Take 1 capsule (150 mg total) by mouth every 6 (six) hours.   PREDNISONE (DELTASONE) 10 MG TABLET    Take 2 tablets (20 mg total) by mouth daily.     Margarita Grizzleanielle Jevon Littlepage, MD 02/17/17 2127

## 2017-02-21 ENCOUNTER — Encounter (HOSPITAL_COMMUNITY): Payer: Self-pay | Admitting: Emergency Medicine

## 2017-02-21 ENCOUNTER — Emergency Department (HOSPITAL_COMMUNITY)
Admission: EM | Admit: 2017-02-21 | Discharge: 2017-02-22 | Disposition: A | Discharge status: Home / Self Care | Payer: Medicaid Other | Attending: Emergency Medicine | Admitting: Emergency Medicine

## 2017-02-21 DIAGNOSIS — Z79899 Other long term (current) drug therapy: Secondary | ICD-10-CM | POA: Insufficient documentation

## 2017-02-21 DIAGNOSIS — J209 Acute bronchitis, unspecified: Secondary | ICD-10-CM | POA: Diagnosis not present

## 2017-02-21 DIAGNOSIS — R05 Cough: Secondary | ICD-10-CM | POA: Diagnosis present

## 2017-02-21 DIAGNOSIS — F17210 Nicotine dependence, cigarettes, uncomplicated: Secondary | ICD-10-CM | POA: Insufficient documentation

## 2017-02-21 MED ORDER — IPRATROPIUM-ALBUTEROL 0.5-2.5 (3) MG/3ML IN SOLN
3.0000 mL | Freq: Once | RESPIRATORY_TRACT | Status: AC
  Administered 2017-02-21: 3 mL via RESPIRATORY_TRACT
  Filled 2017-02-21: qty 3

## 2017-02-21 MED ORDER — DEXAMETHASONE SODIUM PHOSPHATE 4 MG/ML IJ SOLN
10.0000 mg | Freq: Once | INTRAMUSCULAR | Status: AC
  Administered 2017-02-21: 10 mg via INTRAMUSCULAR
  Filled 2017-02-21: qty 3

## 2017-02-21 MED ORDER — ALBUTEROL SULFATE (2.5 MG/3ML) 0.083% IN NEBU
2.5000 mg | INHALATION_SOLUTION | Freq: Once | RESPIRATORY_TRACT | Status: AC
  Administered 2017-02-21: 2.5 mg via RESPIRATORY_TRACT
  Filled 2017-02-21: qty 3

## 2017-02-21 MED ORDER — BENZONATATE 200 MG PO CAPS: 200.0000 mg | ORAL_CAPSULE | Freq: Three times a day (TID) | ORAL | 0 refills | Status: DC | PRN

## 2017-02-21 NOTE — ED Provider Notes (Signed)
[facility] Provider Note   CSN: [id number] Arrival date & time: 02/21/17  2150   By signing my name below, I, John [name], attest that this documentation has been prepared under the direction and in the presence of Quebrad[place]ammy Dustina Scoggin, PA. Electronically Signed: Bobbie Stackhristopher [name], Scribe. 02/21/17. 10:47 PM.  History   Chief Complaint Chief Complaint  Patient presents with  . Cough   The history is provided by the patient. No language interpreter was used.  HPI Comments:  John [name] is a 41 y.o. male who presents to the Emergency Department complaining of worsening cough, wheezing and shortness of breath with exertion for the past week. He states that he was seen here about 3 or 4 days ago and was diagnosed with bronchitis at that time. He was placed on prednisone and clindamycin at that time.  He has finished the prednisone today with no significant relief. He states that his wheezing has worsened. He states that whenever he attempts to sleep, he is awoken due to him having to "gasp for air". He currently uses an inhaler at home. He states that he has been using his inhaler x 2 everyday with about 2 to 3 puffs per use. He denies fever, cough, sore throat, and chest pain. He denies hx of asthma.  Past Medical History:  Diagnosis Date  . Anxiety   . Bronchitis   . Depression   . Pneumonia   . Testicular torsion     Patient Active Problem List   Diagnosis Date Noted  . CAP (community acquired pneumonia) 04/10/2016    Past Surgical History:  Procedure Laterality Date  . APPENDECTOMY    . EYE SURGERY    . NASAL SINUS SURGERY    . VASECTOMY      OB History    No data available       Home Medications    Prior to Admission medications   Medication Sig Start Date End Date Taking? Authorizing Provider  albuterol (PROVENTIL HFA;VENTOLIN HFA) 108 (90 Base) MCG/ACT inhaler Inhale 2 puffs into the lungs every 6 (six) hours as needed for wheezing or shortness of  breath. 04/12/16   Houston SirenPeter Le, MD  albuterol (PROVENTIL HFA;VENTOLIN HFA) 108 (90 Base) MCG/ACT inhaler Inhale 2 puffs into the lungs every 4 (four) hours as needed for wheezing or shortness of breath. 10/31/16   Lavera Guiseana Duo Liu, MD  amoxicillin (AMOXIL) 500 MG capsule Take 1 capsule (500 mg total) by mouth 3 (three) times daily. 10/31/16   Lavera Guiseana Duo Liu, MD  azithromycin (ZITHROMAX) 250 MG tablet Take 1 tablet (250 mg total) by mouth daily. Take first 2 tablets together, then 1 every day until finished. 10/31/16   Lavera Guiseana Duo Liu, MD  Buprenorphine HCl-Naloxone HCl (SUBOXONE) 8-2 MG FILM Place 1 Film under the tongue 2 (two) times daily.    Historical Provider, MD  clindamycin (CLEOCIN) 150 MG capsule Take 1 capsule (150 mg total) by mouth every 6 (six) hours. 02/17/17   Margarita Grizzleanielle Ray, MD  gabapentin (NEURONTIN) 300 MG capsule Take 1,800 mg by mouth 2 (two) times daily. *May take a max of 4500mg  daily. May take as needed. 04/04/16   Historical Provider, MD  omeprazole (PRILOSEC) 20 MG capsule Take 1 capsule (20 mg total) by mouth daily. 11/12/16   Shon Batonourtney F Horton, MD  predniSONE (DELTASONE) 10 MG tablet Take 2 tablets (20 mg total) by mouth daily. 02/17/17   Margarita Grizzleanielle Ray, MD  tapentadol (NUCYNTA) 50 MG tablet Take 50 mg by mouth.  Historical Provider, MD    Family History Family History  Problem Relation Age of Onset  . Diabetes Mother   . Hypertension Father   . Cancer Other     Social History Social History  Substance Use Topics  . Smoking status: Current Every Day Smoker    Packs/day: 1.50    Types: Cigarettes  . Smokeless tobacco: Never Used  . Alcohol use No     Allergies   No known allergies and Other   Review of Systems Review of Systems  Constitutional: Negative for appetite change, fatigue and fever.  HENT: Negative for congestion, ear discharge and sinus pressure.   Eyes: Negative for discharge.  Respiratory: Positive for cough, chest tightness, shortness of breath and wheezing.    Cardiovascular: Negative for chest pain.  Gastrointestinal: Negative for abdominal pain, diarrhea and vomiting.  Genitourinary: Negative for difficulty urinating, frequency and hematuria.  Musculoskeletal: Negative for neck pain and neck stiffness.  Skin: Negative for rash.  Neurological: Negative for headaches.     Physical Exam Updated Vital Signs BP 147/77 (BP Location: Left Arm)   Pulse 85   Temp 98.8 F (37.1 C) (Oral)   Resp 18   Ht 5\' 10"  (1.778 m)   Wt 220 lb (99.8 kg)   SpO2 95%   BMI 31.57 kg/m   Physical Exam  Constitutional: He is oriented to person, place, and time. He appears well-developed and well-nourished. No distress.  HENT:  Head: Normocephalic and atraumatic.  Right Ear: Tympanic membrane and ear canal normal.  Left Ear: Tympanic membrane and ear canal normal.  Mouth/Throat: Uvula is midline, oropharynx is clear and moist and mucous membranes are normal.  Eyes: EOM are normal. Pupils are equal, round, and reactive to light.  Neck: Normal range of motion.  Cardiovascular: Normal rate and regular rhythm.  Exam reveals no gallop and no friction rub.   No murmur heard. Pulmonary/Chest: Effort normal. No respiratory distress. He has wheezes. He exhibits no tenderness.  Diminished lung sounds bilaterally with diffused inspiratory and expiratory wheezing bilaterally. Pt able to speak in full, clear sentences w/o distress  Abdominal: He exhibits no distension.  Musculoskeletal: Normal range of motion.  Neurological: He is alert and oriented to person, place, and time.  Skin: Skin is warm. No rash noted.  Psychiatric: He has a normal mood and affect.  Nursing note and vitals reviewed.    ED Treatments / Results  DIAGNOSTIC STUDIES: Oxygen Saturation is 95% on RA, normal by my interpretation.    COORDINATION OF CARE: 10:35 PM Discussed treatment plan with pt at bedside and pt agreed to plan. I will start the patient on a nebulizer treatment.  Labs (all  labs ordered are listed, but only abnormal results are displayed) Labs Reviewed - No data to display  EKG  EKG Interpretation None       Radiology No results found.  Procedures Procedures (including critical care time)  Medications Ordered in ED Medications  ipratropium-albuterol (DUONEB) 0.5-2.5 (3) MG/3ML nebulizer solution 3 mL (3 mLs Nebulization Given 02/21/17 2307)  albuterol (PROVENTIL) (2.5 MG/3ML) 0.083% nebulizer solution 2.5 mg (2.5 mg Nebulization Given 02/21/17 2307)  dexamethasone (DECADRON) injection 10 mg (10 mg Intramuscular Given 02/21/17 2305)     Initial Impression / Assessment and Plan / ED Course  I have reviewed the triage vital signs and the nursing notes.  Pertinent labs & imaging results that were available during my care of the patient were reviewed by me and considered in  my medical decision making (see chart for details).     Pt here on 02/17/17 for same, continued wheezing tonight.  Had neg CXR on previous visit.    2315  On recheck, pt is feeling much better.  Lung sound improved after albuterol neb.  IM decadron given here and he completed 5 day course of oral steroids earlier today.  He has albuterol MDI at home, has only been using twice a day.  I have instructed him to use 2 puffs 4 times a day for next 2-3 days then as needed.  He agrees to plan, appears stable for d/c   Final Clinical Impressions(s) / ED Diagnoses   Final diagnoses:  Bronchitis with bronchospasm    New Prescriptions New Prescriptions   No medications on file   I personally performed the services described in this documentation, which was scribed in my presence. The recorded information has been reviewed and is accurate.    Pauline Aus, PA-C 02/21/17 2352    Adamarie Izzo, PA-C 02/22/17 0003    Samuel Jester, DO 02/23/17 [phone]

## 2017-02-21 NOTE — Discharge Instructions (Signed)
2 puffs of the inhaler 4 times a day for next 2-3 days then as needed.  Return here for any worsening symptoms

## 2017-02-21 NOTE — ED Triage Notes (Signed)
Patient complaining of cough x 1 week. States he was treated here this week for same.

## 2017-02-22 NOTE — ED Notes (Signed)
Pt states understanding of care given and follow up instructions.  Pt a/o, ambulated from ED with steady gait  

## 2017-03-05 ENCOUNTER — Encounter (HOSPITAL_COMMUNITY): Payer: Self-pay | Admitting: *Deleted

## 2017-03-05 ENCOUNTER — Emergency Department (HOSPITAL_COMMUNITY)
Admission: EM | Admit: 2017-03-05 | Discharge: 2017-03-05 | Disposition: A | Discharge status: Home / Self Care | Payer: Medicaid Other | Attending: Emergency Medicine | Admitting: Emergency Medicine

## 2017-03-05 DIAGNOSIS — F17210 Nicotine dependence, cigarettes, uncomplicated: Secondary | ICD-10-CM | POA: Diagnosis not present

## 2017-03-05 DIAGNOSIS — K029 Dental caries, unspecified: Secondary | ICD-10-CM | POA: Insufficient documentation

## 2017-03-05 DIAGNOSIS — K0889 Other specified disorders of teeth and supporting structures: Secondary | ICD-10-CM | POA: Diagnosis present

## 2017-03-05 MED ORDER — LIDOCAINE HCL (PF) 1 % IJ SOLN
INTRAMUSCULAR | Status: AC
  Filled 2017-03-05: qty 5

## 2017-03-05 MED ORDER — ACETAMINOPHEN 325 MG PO TABS
650.0000 mg | ORAL_TABLET | Freq: Once | ORAL | Status: AC
  Administered 2017-03-05: 650 mg via ORAL
  Filled 2017-03-05: qty 2

## 2017-03-05 MED ORDER — IBUPROFEN 600 MG PO TABS: 600.0000 mg | ORAL_TABLET | Freq: Four times a day (QID) | ORAL | 0 refills | Status: DC

## 2017-03-05 MED ORDER — CLINDAMYCIN HCL 150 MG PO CAPS: ORAL_CAPSULE | ORAL | 0 refills | Status: DC

## 2017-03-05 MED ORDER — IBUPROFEN 800 MG PO TABS
800.0000 mg | ORAL_TABLET | Freq: Once | ORAL | Status: AC
  Administered 2017-03-05: 800 mg via ORAL
  Filled 2017-03-05: qty 1

## 2017-03-05 MED ORDER — CEFTRIAXONE SODIUM 1 G IJ SOLR
1.0000 g | Freq: Once | INTRAMUSCULAR | Status: AC
  Administered 2017-03-05: 1 g via INTRAMUSCULAR
  Filled 2017-03-05: qty 10

## 2017-03-05 NOTE — ED Triage Notes (Signed)
Pt c/o pain to front teeth; pt states the pain is radiating to his eyes; pt has been taking excedrin with no relief

## 2017-03-05 NOTE — Discharge Instructions (Signed)
Please use to clindamycin tablets with breakfast, lunch, and dinner. Please use ibuprofen with breakfast, lunch, dinner, and at bedtime. It is important that she see a dentist concerning your dental pain and infection as soon as possible.

## 2017-03-05 NOTE — ED Provider Notes (Signed)
[facility] Provider Note   CSN: [id number] Arrival date & time: 03/05/17  2021     History   Chief Complaint Chief Complaint  Patient presents with  . Dental Pain    HPI John [name] is a 41 y.o. male.  Patient states he had a few of his clindamycin (the previous infection. He started taking these but has not seen any result on yet, particularly in his pain. He is also using ibuprofen, but states this is not helping much either. He complains of pain from the decayed area of his tooth up to the area underneath the nostril. He has not noted drainage from this area, but complains of increasing pain.   The history is provided by the patient.  Dental Pain   The current episode started more than 2 days ago. The problem occurs hourly. The problem has been gradually worsening. The pain is moderate. Treatments tried: clindamycin and ibuprofen. The treatment provided no relief.    Past Medical History:  Diagnosis Date  . Anxiety   . Bronchitis   . Depression   . Pneumonia   . Testicular torsion     Patient Active Problem List   Diagnosis Date Noted  . CAP (community acquired pneumonia) 04/10/2016    Past Surgical History:  Procedure Laterality Date  . APPENDECTOMY    . EYE SURGERY    . NASAL SINUS SURGERY    . VASECTOMY      OB History    No data available       Home Medications    Prior to Admission medications   Medication Sig Start Date End Date Taking? Authorizing Provider  albuterol (PROVENTIL HFA;VENTOLIN HFA) 108 (90 Base) MCG/ACT inhaler Inhale 2 puffs into the lungs every 4 (four) hours as needed for wheezing or shortness of breath. 10/31/16   Lavera Guiseana Duo Liu, MD  benzonatate (TESSALON) 200 MG capsule Take 1 capsule (200 mg total) by mouth 3 (three) times daily as needed for cough. Swallow whole, do not chew 02/21/17   Tammy Triplett, PA-C  Buprenorphine HCl-Naloxone HCl (SUBOXONE) 8-2 MG FILM Place 1 Film under the tongue 2 (two) times daily.     Historical Provider, MD  clindamycin (CLEOCIN) 150 MG capsule Take 1 capsule (150 mg total) by mouth every 6 (six) hours. 02/17/17   Margarita Grizzleanielle Ray, MD  gabapentin (NEURONTIN) 600 MG tablet Take 1,800 mg by mouth 2 (two) times daily. *May take a max of 4500mg  daily 04/04/16   Historical Provider, MD  predniSONE (DELTASONE) 10 MG tablet Take 2 tablets (20 mg total) by mouth daily. 02/17/17   Margarita Grizzleanielle Ray, MD    Family History Family History  Problem Relation Age of Onset  . Diabetes Mother   . Hypertension Father   . Cancer Other     Social History Social History  Substance Use Topics  . Smoking status: Current Every Day Smoker    Packs/day: 1.50    Types: Cigarettes  . Smokeless tobacco: Never Used  . Alcohol use No     Allergies   No known allergies and Other   Review of Systems Review of Systems  HENT: Positive for dental problem.   All other systems reviewed and are negative.    Physical Exam Updated Vital Signs BP 144/86 (BP Location: Right Arm)   Pulse 67   Temp 98.1 F (36.7 C) (Oral)   Resp 16   Ht 5\' 10"  (1.778 m)   Wt 99.8 kg   SpO2 97%  BMI 31.57 kg/m   Physical Exam  Constitutional: He is oriented to person, place, and time. He appears well-developed and well-nourished.  Non-toxic appearance.  HENT:  Head: Normocephalic.  Right Ear: Tympanic membrane and external ear normal.  Left Ear: Tympanic membrane and external ear normal.  Patient has several dental caries from the left canine to the right canine. Several teeth are decayed to the gum line. There is swelling of the upper gum in this area. There is tenderness at the nasal opening down to the gum area. No drainage, and no visible abscess appreciated.  Eyes: EOM and lids are normal. Pupils are equal, round, and reactive to light.  Neck: Normal range of motion. Neck supple. Carotid bruit is not present.  Cardiovascular: Normal rate, regular rhythm, normal heart sounds, intact distal pulses and normal  pulses.   No murmur heard. Pulmonary/Chest: Breath sounds normal. No respiratory distress.  Abdominal: Soft. Bowel sounds are normal. There is no tenderness. There is no guarding.  Musculoskeletal: Normal range of motion.  Lymphadenopathy:       Head (right side): No submandibular adenopathy present.       Head (left side): No submandibular adenopathy present.    He has no cervical adenopathy.  Neurological: He is alert and oriented to person, place, and time. He has normal strength. No cranial nerve deficit or sensory deficit.  Skin: Skin is warm and dry.  Psychiatric: He has a normal mood and affect. His speech is normal.  Nursing note and vitals reviewed.    ED Treatments / Results  Labs (all labs ordered are listed, but only abnormal results are displayed) Labs Reviewed - No data to display  EKG  EKG Interpretation None       Radiology No results found.  Procedures Procedures (including critical care time)  Medications Ordered in ED Medications - No data to display   Initial Impression / Assessment and Plan / ED Course  I have reviewed the triage vital signs and the nursing notes.  Pertinent labs & imaging results that were available during my care of the patient were reviewed by me and considered in my medical decision making (see chart for details).     *I have reviewed nursing notes, vital signs, and all appropriate lab and imaging results for this patient.**  Final Clinical Impressions(s) / ED Diagnoses MDM Vital signs within normal limits. A shunt has multiple decayed teeth of the upper gum area. There is swelling of the gum, but no visible abscess. The examination is negative forLudwig's angina, or other acute issues. The patient will be treated with intramuscular Rocephin here in the emergency department. A prescription for clindamycin and ibuprofen been given to the patient. The patient is scheduled to see the dental clinic at the local health department  on tomorrow.   Final diagnoses:  Dental caries  Pain, dental    New Prescriptions New Prescriptions   No medications on file     Ivery Quale, PA-C 03/05/17 2314    Loren Racer, MD 03/06/17 [phone]

## 2017-05-07 ENCOUNTER — Encounter (HOSPITAL_COMMUNITY): Payer: Self-pay | Admitting: Adult Health

## 2017-05-07 ENCOUNTER — Emergency Department (HOSPITAL_COMMUNITY)
Admission: EM | Admit: 2017-05-07 | Discharge: 2017-05-07 | Disposition: A | Discharge status: Home / Self Care | Payer: Medicaid Other | Attending: Emergency Medicine | Admitting: Emergency Medicine

## 2017-05-07 DIAGNOSIS — R21 Rash and other nonspecific skin eruption: Secondary | ICD-10-CM | POA: Diagnosis present

## 2017-05-07 DIAGNOSIS — L255 Unspecified contact dermatitis due to plants, except food: Secondary | ICD-10-CM | POA: Insufficient documentation

## 2017-05-07 DIAGNOSIS — Z79899 Other long term (current) drug therapy: Secondary | ICD-10-CM | POA: Diagnosis not present

## 2017-05-07 DIAGNOSIS — L237 Allergic contact dermatitis due to plants, except food: Secondary | ICD-10-CM

## 2017-05-07 DIAGNOSIS — F17210 Nicotine dependence, cigarettes, uncomplicated: Secondary | ICD-10-CM | POA: Insufficient documentation

## 2017-05-07 MED ORDER — DEXAMETHASONE SODIUM PHOSPHATE 4 MG/ML IJ SOLN
8.0000 mg | Freq: Once | INTRAMUSCULAR | Status: AC
Start: 2017-05-07 — End: 2017-05-07
  Administered 2017-05-07: 8 mg via INTRAMUSCULAR
  Filled 2017-05-07: qty 2

## 2017-05-07 MED ORDER — HYDROXYZINE PAMOATE 25 MG PO CAPS: 25.0000 mg | ORAL_CAPSULE | Freq: Three times a day (TID) | ORAL | 0 refills | Status: DC | PRN

## 2017-05-07 MED ORDER — DEXAMETHASONE 4 MG PO TABS: 4.0000 mg | ORAL_TABLET | Freq: Two times a day (BID) | ORAL | 0 refills | Status: DC

## 2017-05-07 MED ORDER — TRIAMCINOLONE ACETONIDE 0.1 % EX CREA: 1.0000 "application " | TOPICAL_CREAM | Freq: Two times a day (BID) | CUTANEOUS | 0 refills | Status: DC

## 2017-05-07 NOTE — ED Triage Notes (Signed)
Presents with poison ivy to bilateral arms, chest and right eye after working in the yard this weekend. Using calamine lotion at home.

## 2017-05-07 NOTE — Discharge Instructions (Signed)
Please use Decadron 2 times daily with food. Please use Vistaril 3 times daily for itching or burning. This medication may cause drowsiness, please do not drive, operate machinery, drink alcohol, or participate in activities requiring concentration when taking this medication. Apply triamcinolone to the rash areas from the neck down 2 times daily. [facility] for additional evaluation and management if not improving.

## 2017-05-07 NOTE — ED Provider Notes (Signed)
[facility] Provider Note   CSN: [id number] Arrival date & time: 05/07/17  1750     History   Chief Complaint Chief Complaint  Patient presents with  . Poison Ivy    HPI John [name] is a 41 y.o. male.  Patient is a 41 year old male who presents to the emergency department following exposure to poison ivy/was a note.  The patient states that over the weekend he was working in the yard and he was exposed to poison oak. He states that he now has rash on both arms, on his left upper chest, and also around his right eye area. He has no difficulty with his vision. He has no swelling of his eye. No other areas reportedly affected. He requests assistance with this problem.       Past Medical History:  Diagnosis Date  . Anxiety   . Bronchitis   . Depression   . Pneumonia   . Testicular torsion     Patient Active Problem List   Diagnosis Date Noted  . CAP (community acquired pneumonia) 04/10/2016    Past Surgical History:  Procedure Laterality Date  . APPENDECTOMY    . EYE SURGERY    . NASAL SINUS SURGERY    . VASECTOMY      OB History    No data available       Home Medications    Prior to Admission medications   Medication Sig Start Date End Date Taking? Authorizing Provider  albuterol (PROVENTIL HFA;VENTOLIN HFA) 108 (90 Base) MCG/ACT inhaler Inhale 2 puffs into the lungs every 4 (four) hours as needed for wheezing or shortness of breath. 10/31/16   Lavera GuiseLiu, Dana Duo, MD  benzonatate (TESSALON) 200 MG capsule Take 1 capsule (200 mg total) by mouth 3 (three) times daily as needed for cough. Swallow whole, do not chew 02/21/17   Triplett, Tammy, PA-C  Buprenorphine HCl-Naloxone HCl (SUBOXONE) 8-2 MG FILM Place 1 Film under the tongue 2 (two) times daily.    [provider]  clindamycin (CLEOCIN) 150 MG capsule 2 po tid with food 03/05/17   Ivery QualeBryant, Analyce Tavares, PA-C  gabapentin (NEURONTIN) 600 MG tablet Take 1,800 mg by mouth 2 (two) times daily. *May take a  max of 4500mg  daily 04/04/16   [provider]  ibuprofen (ADVIL,MOTRIN) 600 MG tablet Take 1 tablet (600 mg total) by mouth 4 (four) times daily. 03/05/17   Ivery QualeBryant, Jomaira Darr, PA-C  predniSONE (DELTASONE) 10 MG tablet Take 2 tablets (20 mg total) by mouth daily. 02/17/17   Margarita Grizzleay, Danielle, MD    Family History Family History  Problem Relation Age of Onset  . Diabetes Mother   . Hypertension Father   . Cancer Other     Social History Social History  Substance Use Topics  . Smoking status: Current Every Day Smoker    Packs/day: 1.50    Types: Cigarettes  . Smokeless tobacco: Never Used  . Alcohol use No     Allergies   No known allergies and Other   Review of Systems Review of Systems  Constitutional: Negative for activity change.       All ROS Neg except as noted in HPI  HENT: Negative for nosebleeds.   Eyes: Negative for photophobia and discharge.  Respiratory: Negative for cough, shortness of breath and wheezing.   Cardiovascular: Negative for chest pain and palpitations.  Gastrointestinal: Negative for abdominal pain and blood in stool.  Genitourinary: Negative for dysuria, frequency and hematuria.  Musculoskeletal: Negative for arthralgias,  back pain and neck pain.  Skin: Positive for rash.  Neurological: Negative for dizziness, seizures and speech difficulty.  Psychiatric/Behavioral: Negative for confusion and hallucinations.     Physical Exam Updated Vital Signs BP (!) 147/79 (BP Location: Right Arm)   Pulse 94   Temp 98.2 F (36.8 C) (Oral)   Resp 18   SpO2 97%   Physical Exam  Constitutional: He is oriented to person, place, and time. He appears well-developed and well-nourished.  Non-toxic appearance.  HENT:  Head: Normocephalic.  Right Ear: Tympanic membrane and external ear normal.  Left Ear: Tympanic membrane and external ear normal.  Eyes: EOM and lids are normal. Pupils are equal, round, and reactive to light.  Rash on the eye brow on the  right,  No rash on lid or conjunctiva.  Neck: Normal range of motion. Neck supple. Carotid bruit is not present.  Cardiovascular: Normal rate, regular rhythm, normal heart sounds, intact distal pulses and normal pulses.   Pulmonary/Chest: Breath sounds normal. No respiratory distress.  Abdominal: Soft. Bowel sounds are normal. There is no tenderness. There is no guarding.  Musculoskeletal: Normal range of motion.  Lymphadenopathy:       Head (right side): No submandibular adenopathy present.       Head (left side): No submandibular adenopathy present.    He has no cervical adenopathy.  Neurological: He is alert and oriented to person, place, and time. He has normal strength. No cranial nerve deficit or sensory deficit.  Skin: Skin is warm and dry.  Red raised blistering rash of the arms, left chest, and right eye brow.  Psychiatric: He has a normal mood and affect. His speech is normal.  Nursing note and vitals reviewed.    ED Treatments / Results  Labs (all labs ordered are listed, but only abnormal results are displayed) Labs Reviewed - No data to display  EKG  EKG Interpretation None       Radiology No results found.  Procedures Procedures (including critical care time)  Medications Ordered in ED Medications - No data to display   Initial Impression / Assessment and Plan / ED Course  I have reviewed the triage vital signs and the nursing notes.  Pertinent labs & imaging results that were available during my care of the patient were reviewed by me and considered in my medical decision making (see chart for details).     **I have reviewed nursing notes, vital signs, and all appropriate lab and imaging results for this patient.*  Final Clinical Impressions(s) / ED Diagnoses MDM  Pt exposed to poison ivy. He has a rash on the arm, chest,. Pt treated with steroids, and antihistamine.   Final diagnoses:  Contact dermatitis due to poison oak    New  Prescriptions New Prescriptions   No medications on file     Ivery Quale, Cordelia Poche 05/09/17 0059    Samuel Jester, DO 05/11/17 1543

## 2017-07-14 ENCOUNTER — Emergency Department (HOSPITAL_COMMUNITY): Payer: Medicaid Other

## 2017-07-14 ENCOUNTER — Encounter (HOSPITAL_COMMUNITY): Payer: Self-pay

## 2017-07-14 ENCOUNTER — Emergency Department (HOSPITAL_COMMUNITY)
Admission: EM | Admit: 2017-07-14 | Discharge: 2017-07-15 | Discharge status: Against Medical Advice | Payer: Medicaid Other | Attending: Emergency Medicine | Admitting: Emergency Medicine

## 2017-07-14 DIAGNOSIS — Z79899 Other long term (current) drug therapy: Secondary | ICD-10-CM | POA: Insufficient documentation

## 2017-07-14 DIAGNOSIS — R0602 Shortness of breath: Secondary | ICD-10-CM | POA: Insufficient documentation

## 2017-07-14 DIAGNOSIS — R079 Chest pain, unspecified: Secondary | ICD-10-CM

## 2017-07-14 DIAGNOSIS — F17210 Nicotine dependence, cigarettes, uncomplicated: Secondary | ICD-10-CM | POA: Insufficient documentation

## 2017-07-14 DIAGNOSIS — R002 Palpitations: Secondary | ICD-10-CM

## 2017-07-14 LAB — BASIC METABOLIC PANEL
ANION GAP: 10 (ref 5–15)
BUN: 12 mg/dL (ref 6–20)
CALCIUM: 8.9 mg/dL (ref 8.9–10.3)
CO2: 24 mmol/L (ref 22–32)
Chloride: 101 mmol/L (ref 101–111)
Creatinine, Ser: 0.89 mg/dL (ref 0.61–1.24)
GFR calc Af Amer: >60 mL/min (ref >60)
GLUCOSE: 201 mg/dL — AB (ref 65–99)
POTASSIUM: 3.2 mmol/L — AB (ref 3.5–5.1)
Sodium: 135 mmol/L (ref 135–145)

## 2017-07-14 LAB — CBC
HCT: 42.1 % (ref 39.0–52.0)
HEMOGLOBIN: 14.6 g/dL (ref 13.0–17.0)
MCH: 30.5 pg (ref 26.0–34.0)
MCHC: 34.7 g/dL (ref 30.0–36.0)
MCV: 87.9 fL (ref 78.0–100.0)
Platelets: 220 10*3/uL (ref 150–400)
RBC: 4.79 MIL/uL (ref 4.22–5.81)
RDW: 13.4 % (ref 11.5–15.5)
WBC: 9.8 10*3/uL (ref 4.0–10.5)

## 2017-07-14 LAB — TROPONIN I

## 2017-07-14 NOTE — ED Notes (Signed)
ED Provider at bedside. 

## 2017-07-14 NOTE — ED Triage Notes (Signed)
Reports of chest pain and dizziness since Saturday.

## 2017-07-14 NOTE — ED Notes (Signed)
Pt c/o increased sob and chest pain. Pt's vitals were taken and everything wnl.

## 2017-07-14 NOTE — ED Provider Notes (Signed)
[facility] Provider Note   CSN: [id number] Arrival date & time: 07/14/17  2103     History   Chief Complaint Chief Complaint  Patient presents with  . Chest Pain  . Dizziness    HPI John [name] is a 41 y.o. male.  Patient presents with complaints of chest pain. Over the last 2 days he has had 4 or 5 separate episodes of sudden onset sharp pains in the chest that is associated with palpitations and shortness of breath. He reports that the episodes last for several minutes and then resolved. He has not identified any causal factors. He is concerned because he has a strong family history of heart disease. Currently he is asymptomatic.       Past Medical History:  Diagnosis Date  . Anxiety   . Bronchitis   . Depression   . Pneumonia   . Testicular torsion     Patient Active Problem List   Diagnosis Date Noted  . CAP (community acquired pneumonia) 04/10/2016    Past Surgical History:  Procedure Laterality Date  . APPENDECTOMY    . EYE SURGERY    . NASAL SINUS SURGERY    . VASECTOMY      OB History    No data available       Home Medications    Prior to Admission medications   Medication Sig Start Date End Date Taking? Authorizing Provider  albuterol (PROVENTIL HFA;VENTOLIN HFA) 108 (90 Base) MCG/ACT inhaler Inhale 2 puffs into the lungs every 4 (four) hours as needed for wheezing or shortness of breath. 10/31/16  Yes Lavera Guise, MD  Buprenorphine HCl-Naloxone HCl (SUBOXONE) 8-2 MG FILM Place 1 Film under the tongue daily.    Yes [provider]    Family History Family History  Problem Relation Age of Onset  . Diabetes Mother   . Hypertension Father   . Cancer Other     Social History Social History  Substance Use Topics  . Smoking status: Current Every Day Smoker    Packs/day: 1.50    Types: Cigarettes  . Smokeless tobacco: Never Used  . Alcohol use No     Allergies   No known allergies and Other   Review of  Systems Review of Systems  Respiratory: Positive for shortness of breath.   Cardiovascular: Positive for chest pain and palpitations.  All other systems reviewed and are negative.    Physical Exam Updated Vital Signs BP 110/69   Pulse (!) 51   Temp 97.7 F (36.5 C)   Resp 18   Ht 5\' 10"  (1.778 m)   Wt 99.8 kg (220 lb)   SpO2 97%   BMI 31.57 kg/m   Physical Exam  Constitutional: He is oriented to person, place, and time. He appears well-developed and well-nourished. No distress.  HENT:  Head: Normocephalic and atraumatic.  Right Ear: Hearing normal.  Left Ear: Hearing normal.  Nose: Nose normal.  Mouth/Throat: Oropharynx is clear and moist and mucous membranes are normal.  Eyes: Pupils are equal, round, and reactive to light. Conjunctivae and EOM are normal.  Neck: Normal range of motion. Neck supple.  Cardiovascular: Regular rhythm, S1 normal and S2 normal.  Exam reveals no gallop and no friction rub.   No murmur heard. Pulmonary/Chest: Effort normal and breath sounds normal. No respiratory distress. He exhibits no tenderness.  Abdominal: Soft. Normal appearance and bowel sounds are normal. There is no hepatosplenomegaly. There is no tenderness. There is no rebound, no  guarding, no tenderness at McBurney's point and negative Murphy's sign. No hernia.  Musculoskeletal: Normal range of motion.  Neurological: He is alert and oriented to person, place, and time. He has normal strength. No cranial nerve deficit or sensory deficit. Coordination normal. GCS eye subscore is 4. GCS verbal subscore is 5. GCS motor subscore is 6.  Skin: Skin is warm, dry and intact. No rash noted. No cyanosis.  Psychiatric: He has a normal mood and affect. His speech is normal and behavior is normal. Thought content normal.  Nursing note and vitals reviewed.    ED Treatments / Results  Labs (all labs ordered are listed, but only abnormal results are displayed) Labs Reviewed  BASIC METABOLIC PANEL  - Abnormal; Notable for the following:       Result Value   Potassium 3.2 (*)    Glucose, Bld 201 (*)    All other components within normal limits  CBC  TROPONIN I    EKG  EKG Interpretation  Date/Time:  Monday July 14 2017 21:09:54 EDT Ventricular Rate:  75 PR Interval:  152 QRS Duration: 94 QT Interval:  380 QTC Calculation: 424 R Axis:   -2 Text Interpretation:  Normal sinus rhythm Normal ECG Confirmed by Gilda CreasePollina, Antario Yasuda J [phone]([id number]) on 07/14/2017 11:06:34 PM       Radiology Dg Chest 2 View  Result Date: 07/14/2017 CLINICAL DATA:  Initial evaluation for acute chest pain. EXAM: CHEST  2 VIEW COMPARISON:  Prior radiograph from 02/17/2015. FINDINGS: The cardiac and mediastinal silhouettes are stable in size and contour, and remain within normal limits. The lungs are normally inflated. Diffuse peribronchial thickening, similar to previous. No airspace consolidation, pleural effusion, or pulmonary edema is identified. There is no pneumothorax. No acute osseous abnormality identified. IMPRESSION: 1. Scattered peribronchial thickening, similar to previous, and could reflect sequelae of COPD, reactive airways disease, or acute bronchiolitis. 2. No other active cardiopulmonary disease. Electronically Signed   By: Rise MuBenjamin  McClintock M.D.   On: 07/14/2017 21:41    Procedures Procedures (including critical care time)  Medications Ordered in ED Medications - No data to display   Initial Impression / Assessment and Plan / ED Course  I have reviewed the triage vital signs and the nursing notes.  Pertinent labs & imaging results that were available during my care of the patient were reviewed by me and considered in my medical decision making (see chart for details).     Patient presents with very atypical chest pain. His cardiac risk factors are family history and history of smoking. His EKG was unremarkable. Troponin was negative. HEART score is 1-2 (?DM as glucose elevated today  but no history), low risk.  Plan was for second troponin and outpatient follow-up with cardiology. During the evaluation, prior to getting a second troponin, patient abruptly left the emergency department because he had an emergency at home. I was unable to give him discharge instructions.  Final Clinical Impressions(s) / ED Diagnoses   Final diagnoses:  Chest pain, unspecified type  Palpitations    New Prescriptions Discharge Medication List as of 07/15/2017 12:24 AM       Blinda LeatherwoodPollina, Canary Brimhristopher J, MD 07/15/17 0028

## 2017-07-15 NOTE — ED Notes (Signed)
Patient called out stating he needs to leave. Discussed with him that there is an order for troponin draw. Pt says he cannot stay. Pt signed out AMA and left. EDP made aware of the same.

## 2017-08-02 IMAGING — DX DG CHEST 2V
2 series · 2 of 2 positions shown · non-contrast
Comparison: Chest radiograph performed 10/31/2016

CLINICAL DATA: Acute onset of epigastric cramping, chest and
axillary pain, and left arm tingling. Initial encounter.

EXAM:
CHEST  2 VIEW

[chest pa]
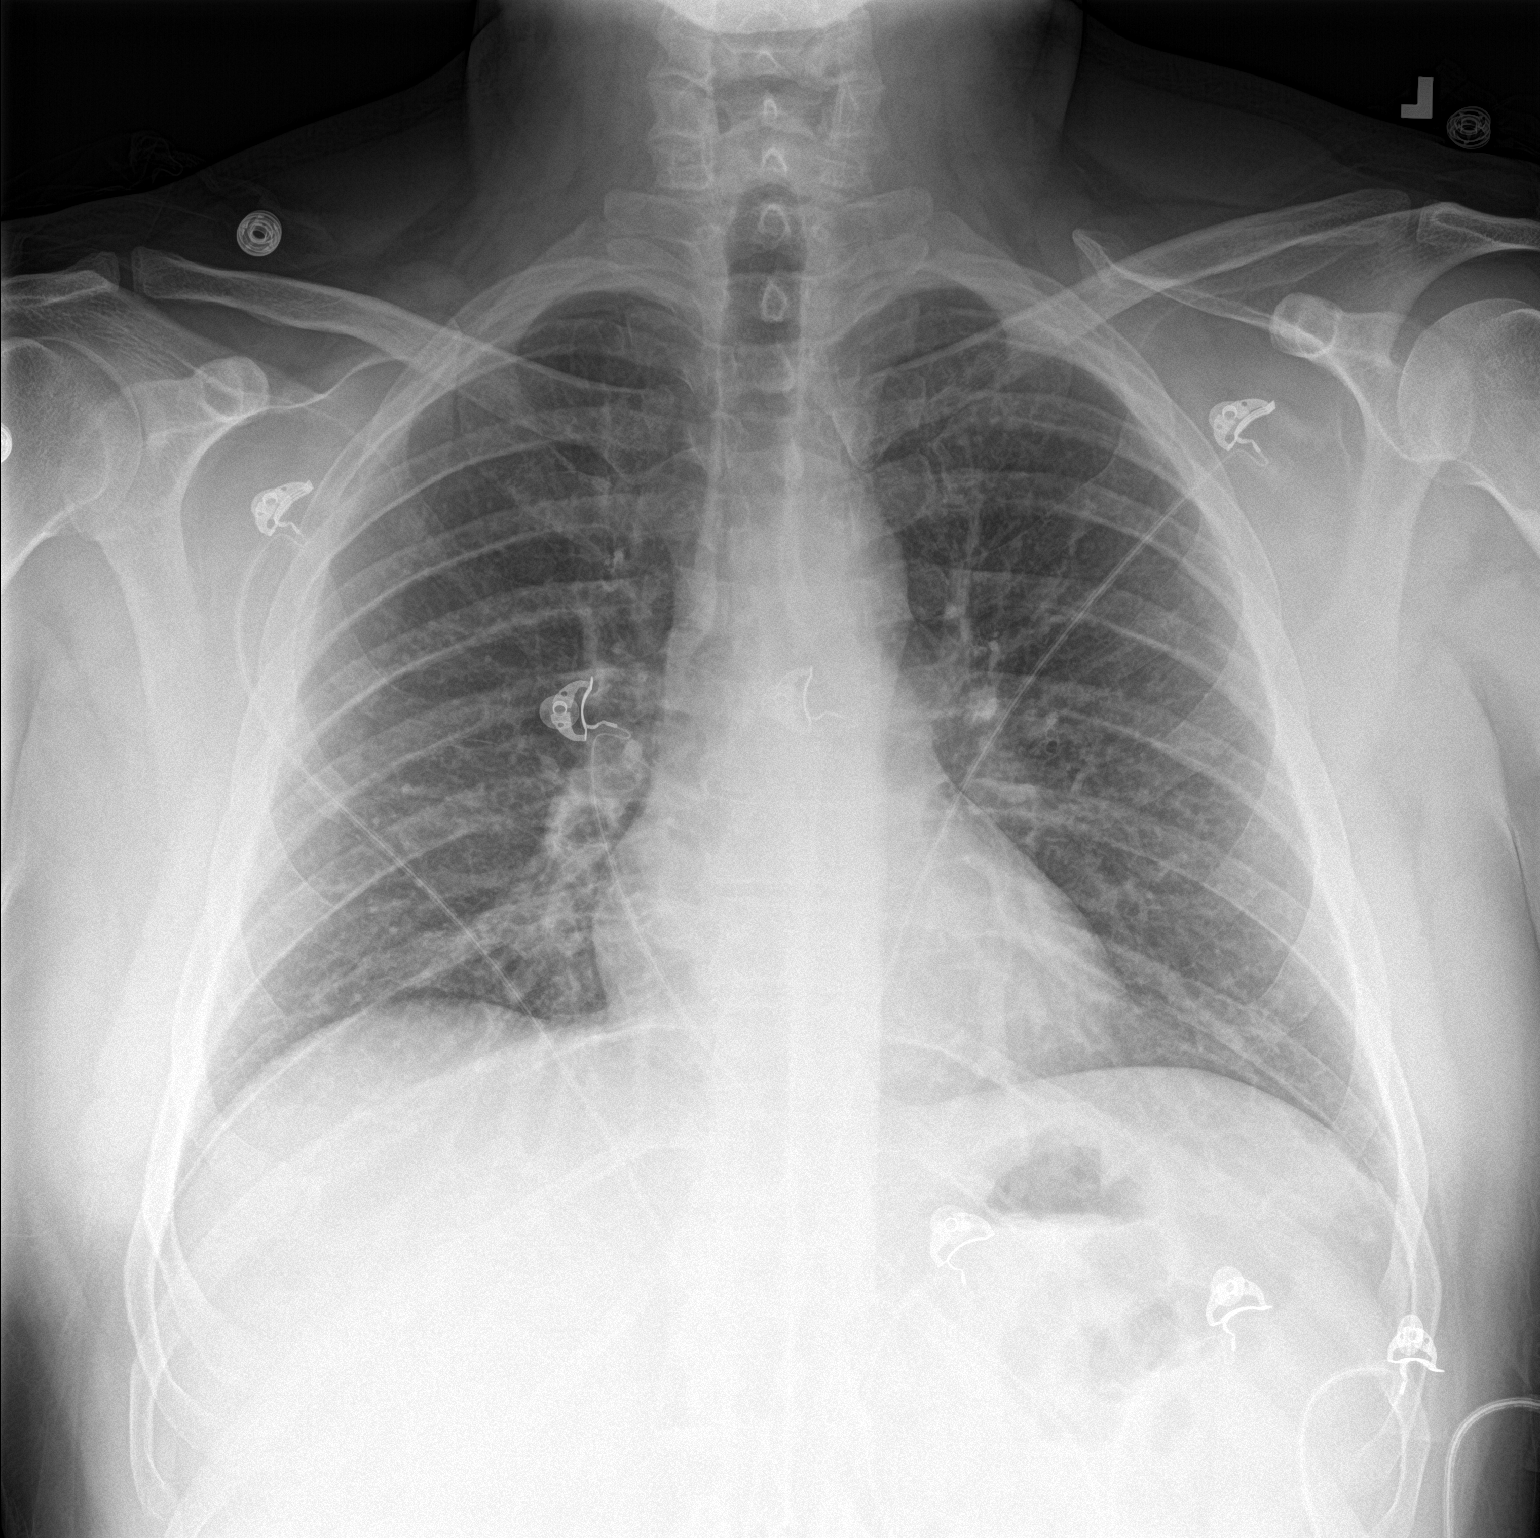

[chest lat]
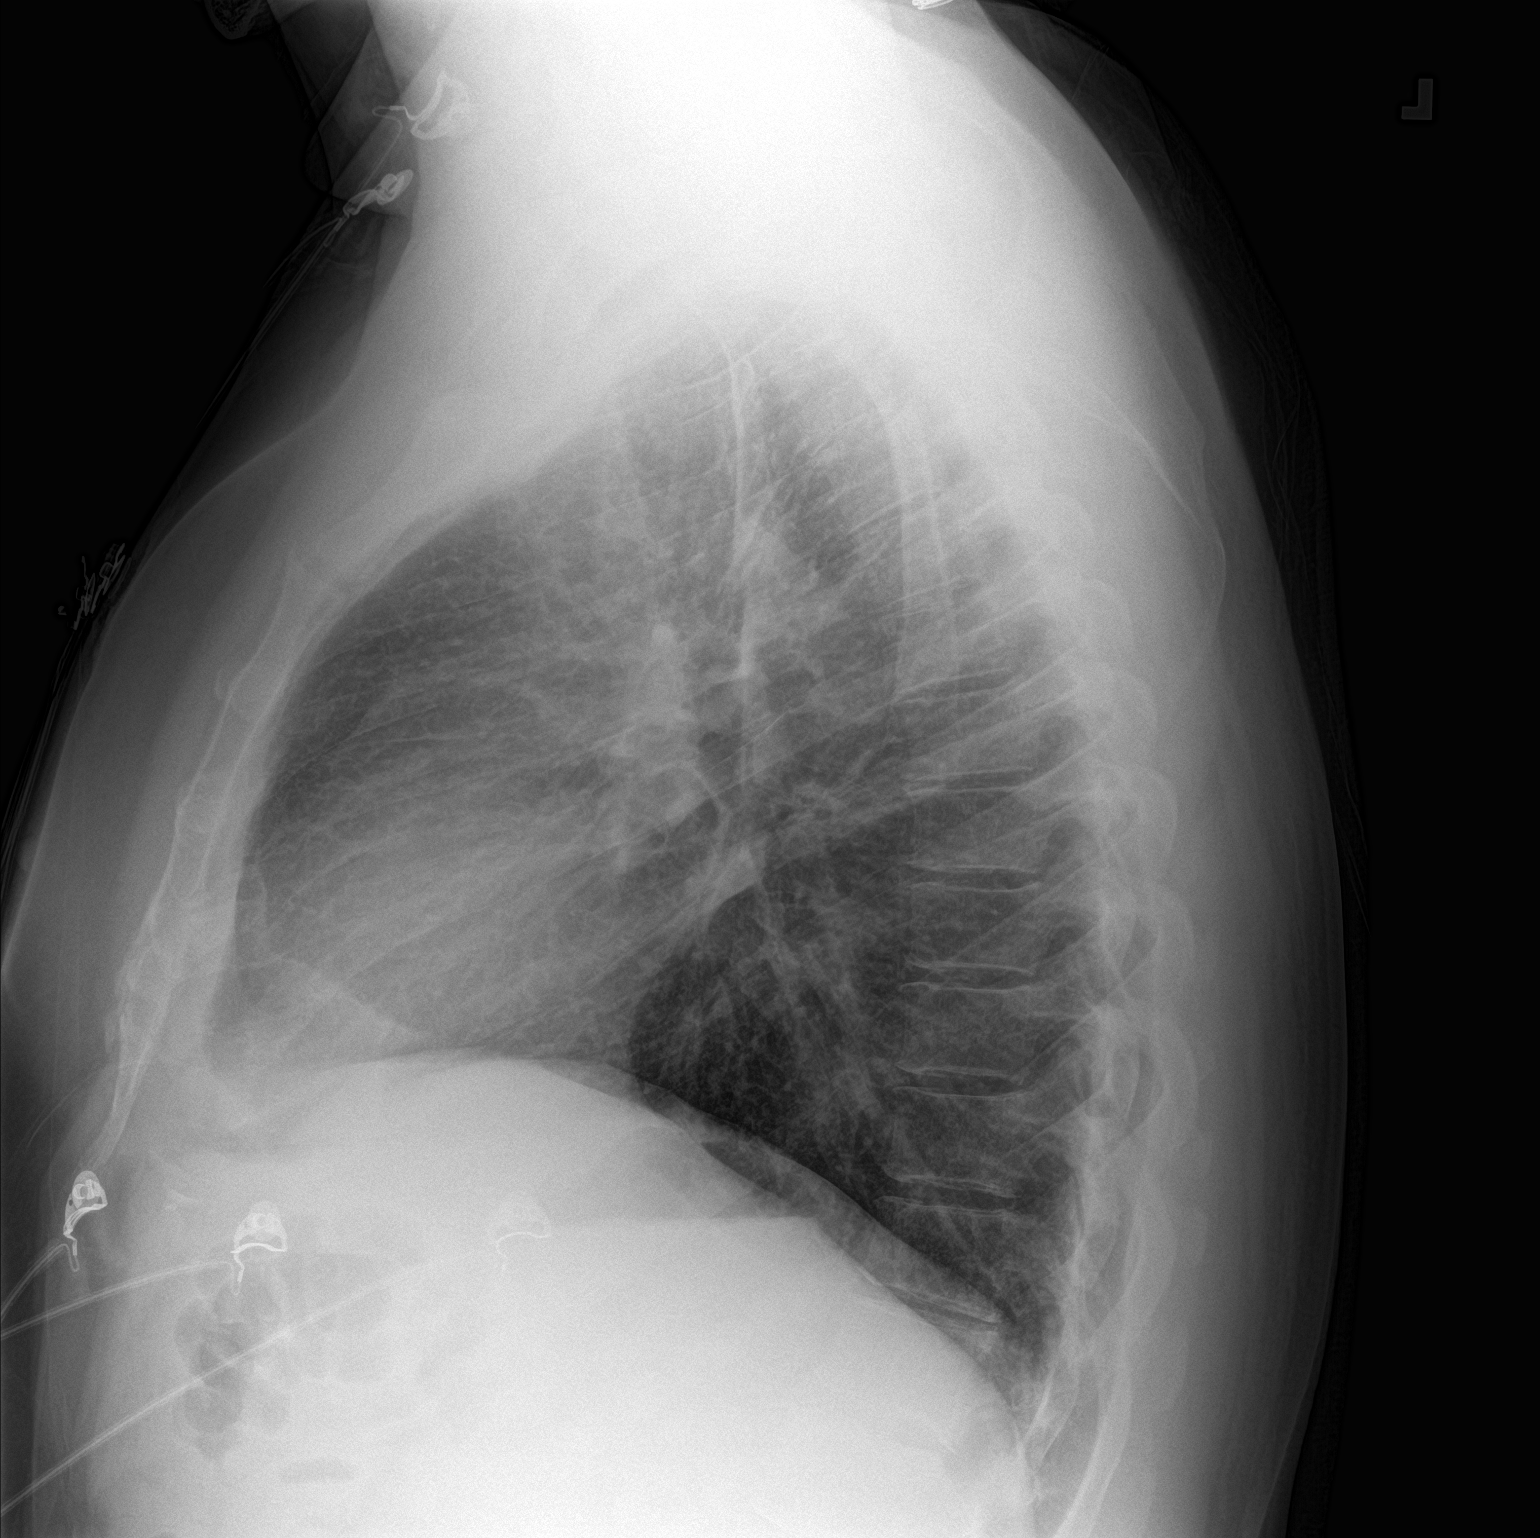

[2 of 2 positions shown; findings below may reference images not displayed]

FINDINGS: The lungs are well-aerated. Mild vascular congestion is noted.
Peribronchial thickening is seen. There is no evidence of focal
opacification, pleural effusion or pneumothorax.

The heart is normal in size; the mediastinal contour is within
normal limits. No acute osseous abnormalities are seen.
IMPRESSION: Mild vascular congestion noted.  Peribronchial thickening seen.

## 2017-09-08 ENCOUNTER — Encounter (HOSPITAL_COMMUNITY): Payer: Self-pay | Admitting: Emergency Medicine

## 2017-09-08 ENCOUNTER — Emergency Department (HOSPITAL_COMMUNITY)
Admission: EM | Admit: 2017-09-08 | Discharge: 2017-09-08 | Disposition: A | Discharge status: Home / Self Care | Payer: Self-pay | Attending: Emergency Medicine | Admitting: Emergency Medicine

## 2017-09-08 DIAGNOSIS — F17210 Nicotine dependence, cigarettes, uncomplicated: Secondary | ICD-10-CM | POA: Insufficient documentation

## 2017-09-08 DIAGNOSIS — K047 Periapical abscess without sinus: Secondary | ICD-10-CM | POA: Insufficient documentation

## 2017-09-08 DIAGNOSIS — Z79899 Other long term (current) drug therapy: Secondary | ICD-10-CM | POA: Insufficient documentation

## 2017-09-08 MED ORDER — CEFTRIAXONE SODIUM 1 G IJ SOLR
1.0000 g | Freq: Once | INTRAMUSCULAR | Status: AC
  Administered 2017-09-08: 1 g via INTRAMUSCULAR
  Filled 2017-09-08: qty 10

## 2017-09-08 MED ORDER — CLINDAMYCIN HCL 150 MG PO CAPS: 150.0000 mg | ORAL_CAPSULE | Freq: Four times a day (QID) | ORAL | 0 refills | Status: DC

## 2017-09-08 MED ORDER — IBUPROFEN 800 MG PO TABS
800.0000 mg | ORAL_TABLET | Freq: Once | ORAL | Status: AC
  Administered 2017-09-08: 800 mg via ORAL
  Filled 2017-09-08: qty 1

## 2017-09-08 MED ORDER — LIDOCAINE HCL (PF) 1 % IJ SOLN
INTRAMUSCULAR | Status: AC
  Administered 2017-09-08: 2.5 mL
  Filled 2017-09-08: qty 5

## 2017-09-08 MED ORDER — ONDANSETRON HCL 4 MG PO TABS
4.0000 mg | ORAL_TABLET | Freq: Once | ORAL | Status: AC
  Administered 2017-09-08: 4 mg via ORAL
  Filled 2017-09-08: qty 1

## 2017-09-08 MED ORDER — CLINDAMYCIN HCL 150 MG PO CAPS
300.0000 mg | ORAL_CAPSULE | Freq: Once | ORAL | Status: AC
  Administered 2017-09-08: 300 mg via ORAL
  Filled 2017-09-08: qty 2

## 2017-09-08 NOTE — Discharge Instructions (Signed)
Please use clindamycin with meals and at bedtime. It is important that you see a dentist as soon as possible. A dental resource guide has been added to your discharge paper work.

## 2017-09-08 NOTE — ED Provider Notes (Signed)
[facility] Provider Note   CSN: [id number] Arrival date & time: 09/08/17  1557     History   Chief Complaint Chief Complaint  Patient presents with  . Dental Pain    HPI John [name] is a 41 y.o. male.  Pt states he has hand problems with decayed teeth due to drug addiction. He is 2 years clean, but has loss his medicaid and having problem locating a dentist.   The history is provided by the patient.  Dental Pain   This is a new problem. The current episode started 6 to 12 hours ago. The problem occurs hourly. The problem has been gradually worsening. The pain is moderate. Treatments tried: OTC pain meds.    Past Medical History:  Diagnosis Date  . Anxiety   . Bronchitis   . Depression   . Pneumonia   . Testicular torsion     Patient Active Problem List   Diagnosis Date Noted  . CAP (community acquired pneumonia) 04/10/2016    Past Surgical History:  Procedure Laterality Date  . APPENDECTOMY    . EYE SURGERY    . NASAL SINUS SURGERY    . VASECTOMY      OB History    No data available       Home Medications    Prior to Admission medications   Medication Sig Start Date End Date Taking? Authorizing Provider  albuterol (PROVENTIL HFA;VENTOLIN HFA) 108 (90 Base) MCG/ACT inhaler Inhale 2 puffs into the lungs every 4 (four) hours as needed for wheezing or shortness of breath. 10/31/16   Lavera GuiseLiu, Dana Duo, MD  Buprenorphine HCl-Naloxone HCl (SUBOXONE) 8-2 MG FILM Place 1 Film under the tongue daily.     [provider]    Family History Family History  Problem Relation Age of Onset  . Diabetes Mother   . Hypertension Father   . Cancer Other     Social History Social History  Substance Use Topics  . Smoking status: Current Every Day Smoker    Packs/day: 1.50    Types: Cigarettes  . Smokeless tobacco: Never Used  . Alcohol use No     Allergies   No known allergies and Other   Review of Systems Review of Systems    Constitutional: Negative for activity change.       All ROS Neg except as noted in HPI  HENT: Positive for dental problem. Negative for nosebleeds.   Eyes: Negative for photophobia and discharge.  Respiratory: Negative for cough, shortness of breath and wheezing.   Cardiovascular: Negative for chest pain and palpitations.  Gastrointestinal: Negative for abdominal pain and blood in stool.  Genitourinary: Negative for dysuria, frequency and hematuria.  Musculoskeletal: Negative for arthralgias, back pain and neck pain.  Skin: Negative.   Neurological: Negative for dizziness, seizures and speech difficulty.  Psychiatric/Behavioral: Negative for confusion and hallucinations.     Physical Exam Updated Vital Signs BP 136/86 (BP Location: Right Arm)   Pulse 88   Temp 98.6 F (37 C) (Oral)   Resp 20   Ht 5\' 10"  (1.778 m)   Wt 99.8 kg (220 lb)   SpO2 96%   BMI 31.57 kg/m   Physical Exam  Constitutional: He is oriented to person, place, and time. He appears well-developed and well-nourished.  Non-toxic appearance.  HENT:  Head: Normocephalic.  Right Ear: Tympanic membrane and external ear normal.  Left Ear: Tympanic membrane and external ear normal.  There is swelling of the upper gum. There  are multiple cavities of the upper gum. No visible abscess. No drainage. Airway patent. No swelling under the tongue.  Facial temp symmetrical.  Eyes: Pupils are equal, round, and reactive to light. EOM and lids are normal.  Neck: Normal range of motion. Neck supple. Carotid bruit is not present.  Cardiovascular: Normal rate, regular rhythm, normal heart sounds, intact distal pulses and normal pulses.   Pulmonary/Chest: Breath sounds normal. No respiratory distress.  Abdominal: Soft. Bowel sounds are normal. There is no tenderness. There is no guarding.  Musculoskeletal: Normal range of motion.  Lymphadenopathy:       Head (right side): No submandibular adenopathy present.       Head (left  side): No submandibular adenopathy present.    He has no cervical adenopathy.  Neurological: He is alert and oriented to person, place, and time. He has normal strength. No cranial nerve deficit or sensory deficit.  Skin: Skin is warm and dry.  Psychiatric: He has a normal mood and affect. His speech is normal.  Nursing note and vitals reviewed.    ED Treatments / Results  Labs (all labs ordered are listed, but only abnormal results are displayed) Labs Reviewed - No data to display  EKG  EKG Interpretation None       Radiology No results found.  Procedures Procedures (including critical care time)  Medications Ordered in ED Medications - No data to display   Initial Impression / Assessment and Plan / ED Course  I have reviewed the triage vital signs and the nursing notes.  Pertinent labs & imaging results that were available during my care of the patient were reviewed by me and considered in my medical decision making (see chart for details).       Final Clinical Impressions(s) / ED Diagnoses MDM  vital signs within normal limits. Patient has a histoy of dental carie for quite some time. He has swelling of the upper gum. No evidence for Ludwig's angina or other emergent changes.  Pt treated in ED with IM Rocephin at pt request and started on clindamycin. Dental resource given to the patient.   Final diagnoses:  Dental infection    New Prescriptions New Prescriptions   CLINDAMYCIN (CLEOCIN) 150 MG CAPSULE    Take 1 capsule (150 mg total) by mouth every 6 (six) hours.     Ivery Quale, PA-C 09/08/17 1651    Bethann Berkshire, MD 09/09/17 2312

## 2017-09-08 NOTE — ED Triage Notes (Signed)
Pt woke up with dental pain today and right side facial swelling. Mild swelling noted. Has been taking the migraine cocktail OTC with no relief.

## 2017-11-07 ENCOUNTER — Emergency Department (HOSPITAL_COMMUNITY)
Admission: EM | Admit: 2017-11-07 | Discharge: 2017-11-07 | Disposition: A | Discharge status: Home / Self Care | Payer: BLUE CROSS/BLUE SHIELD | Attending: Emergency Medicine | Admitting: Emergency Medicine

## 2017-11-07 ENCOUNTER — Emergency Department (HOSPITAL_COMMUNITY): Payer: BLUE CROSS/BLUE SHIELD

## 2017-11-07 ENCOUNTER — Encounter (HOSPITAL_COMMUNITY): Payer: Self-pay

## 2017-11-07 DIAGNOSIS — Z79899 Other long term (current) drug therapy: Secondary | ICD-10-CM | POA: Insufficient documentation

## 2017-11-07 DIAGNOSIS — F17210 Nicotine dependence, cigarettes, uncomplicated: Secondary | ICD-10-CM | POA: Diagnosis not present

## 2017-11-07 DIAGNOSIS — R200 Anesthesia of skin: Secondary | ICD-10-CM | POA: Diagnosis not present

## 2017-11-07 DIAGNOSIS — R0789 Other chest pain: Secondary | ICD-10-CM | POA: Diagnosis not present

## 2017-11-07 DIAGNOSIS — R079 Chest pain, unspecified: Secondary | ICD-10-CM | POA: Diagnosis not present

## 2017-11-07 HISTORY — DX: Opioid dependence, uncomplicated: F11.20

## 2017-11-07 HISTORY — DX: Panic disorder (episodic paroxysmal anxiety): F41.0

## 2017-11-07 LAB — CBC WITH DIFFERENTIAL/PLATELET
Basophils Absolute: 0 10*3/uL (ref 0.0–0.1)
Basophils Relative: 0 %
Eosinophils Absolute: 0.5 10*3/uL (ref 0.0–0.7)
Eosinophils Relative: 6 %
HEMATOCRIT: 44.4 % (ref 39.0–52.0)
HEMOGLOBIN: 15.1 g/dL (ref 13.0–17.0)
LYMPHS ABS: 2.1 10*3/uL (ref 0.7–4.0)
LYMPHS PCT: 26 %
MCH: 29.9 pg (ref 26.0–34.0)
MCHC: 34 g/dL (ref 30.0–36.0)
MCV: 87.9 fL (ref 78.0–100.0)
MONOS PCT: 8 %
Monocytes Absolute: 0.7 10*3/uL (ref 0.1–1.0)
NEUTROS ABS: 4.8 10*3/uL (ref 1.7–7.7)
NEUTROS PCT: 60 %
PLATELETS: 225 10*3/uL (ref 150–400)
RBC: 5.05 MIL/uL (ref 4.22–5.81)
RDW: 13.1 % (ref 11.5–15.5)
WBC: 8.1 10*3/uL (ref 4.0–10.5)

## 2017-11-07 LAB — BASIC METABOLIC PANEL
ANION GAP: 7 (ref 5–15)
BUN: 16 mg/dL (ref 6–20)
CALCIUM: 9 mg/dL (ref 8.9–10.3)
CO2: 26 mmol/L (ref 22–32)
Chloride: 103 mmol/L (ref 101–111)
Creatinine, Ser: 0.96 mg/dL (ref 0.61–1.24)
GLUCOSE: 166 mg/dL — AB (ref 65–99)
Potassium: 4 mmol/L (ref 3.5–5.1)
Sodium: 136 mmol/L (ref 135–145)

## 2017-11-07 LAB — TROPONIN I: Troponin I: <0.03 ng/mL (ref <0.03)

## 2017-11-07 NOTE — ED Triage Notes (Signed)
Ems reports approx 30 min ago pt was sitting at home in front of computer and started having left sided chest pain, numbness in left jaw and left arm.  Reports numbness lasted approx 10-15 seconds.  Reports chest pain got worse when he stood up.  EMS gave 4 baby asa and applied o2 at 2 liters.  Reports pain decreased to 2.  No nitro given.

## 2017-11-07 NOTE — Discharge Instructions (Signed)
Your vital signs within normal limits.  Your oxygen level is 97% on room air.  Your chest x-ray, EKG, and lab test are all negative for acute event.  Please see your primary physician or return to the emergency department if any changes in your general condition, any shortness of breath, unusual chest pain, changes in your level of consciousness, or acute problems.

## 2017-11-07 NOTE — ED Provider Notes (Signed)
[facility] EMERGENCY DEPARTMENT Provider Note   CSN: [id number] Arrival date & time: 11/07/17  9528     History   Chief Complaint Chief Complaint  Patient presents with  . Chest Pain    HPI Jule Sw[place]and is a 41 y.o. male.  Patient is a 41 year old male who presents to the emergency department with a complaint of chest pain.  The patient stated that this problem started approximately 2 hours ago.  The patient states he was watching a show on his phone.  He began to have pain in the epigastric area that moved into his left chest.  He thought initially that he might be having a panic attack so he stood up and walked around a little bit.  But he states that the pain came back and lasted for another 10-15 seconds.  The second time the patient had pain he had a sensation of heaviness in his upper abdomen and lower chest area.  The patient acknowledges that he had a large cup of coffee the prior to the pain starting.  Patient states that he has this cup of coffee every day, and it usually does not bother him.  No recent injury to the chest.  No operations or procedures reported.  The patient has not had any long periods of time with inactivity and has not been on any new trips.  Patient also acknowledges having a dose of Suboxone, and he had at least 2 hits off of his marijuana pipe.  The patient denies nausea, vomiting.  He denies loss of consciousness.  There was no neck pain appreciated.  The patient states he did have some dizziness during 1 of the episodes of pain.      Past Medical History:  Diagnosis Date  . Anxiety   . Bronchitis   . Depression   . Opiate addiction (HCC)   . Panic attacks   . Pneumonia   . Testicular torsion     Patient Active Problem List   Diagnosis Date Noted  . CAP (community acquired pneumonia) 04/10/2016    Past Surgical History:  Procedure Laterality Date  . APPENDECTOMY    . EYE SURGERY    . NASAL SINUS SURGERY    . VASECTOMY      OB  History    No data available       Home Medications    Prior to Admission medications   Medication Sig Start Date End Date Taking? Authorizing Provider  albuterol (PROVENTIL HFA;VENTOLIN HFA) 108 (90 Base) MCG/ACT inhaler Inhale 2 puffs into the lungs every 4 (four) hours as needed for wheezing or shortness of breath. 10/31/16   Lavera Guise, MD  Buprenorphine HCl-Naloxone HCl (SUBOXONE) 8-2 MG FILM Place 1 Film under the tongue daily.     [provider]  clindamycin (CLEOCIN) 150 MG capsule Take 1 capsule (150 mg total) by mouth every 6 (six) hours. 09/08/17   Ivery Quale, PA-C    Family History Family History  Problem Relation Age of Onset  . Diabetes Mother   . Hypertension Father   . Cancer Other     Social History Social History   Tobacco Use  . Smoking status: Current Every Day Smoker    Packs/day: 1.50    Types: Cigarettes  . Smokeless tobacco: Never Used  Substance Use Topics  . Alcohol use: No  . Drug use: Yes    Types: Marijuana    Comment: 0730 this morning     Allergies  No known allergies and Other   Review of Systems Review of Systems  Cardiovascular: Positive for chest pain.  Neurological: Positive for dizziness.  Psychiatric/Behavioral: The patient is nervous/anxious.      Physical Exam Updated Vital Signs BP 123/78 (BP Location: Left Arm)   Pulse 71   Temp 98.5 F (36.9 C) (Oral)   Resp 16   Ht 5\' 10"  (1.778 m)   Wt 104.3 kg (230 lb)   SpO2 100%   BMI 33.00 kg/m   Physical Exam  Constitutional: He is oriented to person, place, and time. He appears well-developed and well-nourished.  Non-toxic appearance.  HENT:  Head: Normocephalic.  Right Ear: Tympanic membrane and external ear normal.  Left Ear: Tympanic membrane and external ear normal.  Eyes: EOM and lids are normal. Pupils are equal, round, and reactive to light.  Neck: Normal range of motion. Neck supple. Carotid bruit is not present.  Cardiovascular: Normal  rate, regular rhythm, normal heart sounds, intact distal pulses and normal pulses. Exam reveals no gallop and no friction rub.  No murmur heard. Pulmonary/Chest: Breath sounds normal. No respiratory distress.  There is symmetrical rise and fall of the chest.  Patient speaks in complete sentences without problem.  Abdominal: Soft. Bowel sounds are normal. There is no tenderness. There is no guarding.  Musculoskeletal: Normal range of motion. He exhibits no edema.  Negative Homans sign  Lymphadenopathy:       Head (right side): No submandibular adenopathy present.       Head (left side): No submandibular adenopathy present.    He has no cervical adenopathy.  Neurological: He is alert and oriented to person, place, and time. He has normal strength. No cranial nerve deficit or sensory deficit.  Skin: Skin is warm and dry.  Psychiatric: He has a normal mood and affect. His speech is normal.  Nursing note and vitals reviewed.    ED Treatments / Results  Labs (all labs ordered are listed, but only abnormal results are displayed) Labs Reviewed  CBC WITH DIFFERENTIAL/PLATELET  BASIC METABOLIC PANEL  TROPONIN I    EKG  EKG Interpretation  Date/Time:  Friday November 07 2017 09:56:26 EST Ventricular Rate:  77 PR Interval:    QRS Duration: 94 QT Interval:  364 QTC Calculation: 412 R Axis:   -9 Text Interpretation:  Sinus rhythm Normal ECG Left axis deviation since last tracing no significant change Confirmed by Eber HongMiller, Brian ([id number]) on 11/07/2017 10:02:18 AM       Radiology No results found.  Procedures Procedures (including critical care time)  Medications Ordered in ED Medications - No data to display   Initial Impression / Assessment and Plan / ED Course  I have reviewed the triage vital signs and the nursing notes.  Pertinent labs & imaging results that were available during my care of the patient were reviewed by me and considered in my medical decision making (see  chart for details).      Final Clinical Impressions(s) / ED Diagnoses MDM Vital signs within normal limits.  Electrocardiogram shows a normal sinus rhythm at 77 bpm.  No acute event noted.  Patient received 4 baby aspirin in route by EMS. Patient states he had some mild heaviness in his epigastric area after arriving in the emergency department.  No shortness of breath noted.  Recheck.  Patient conversing with his significant other in the room.  The patient is not having chest pain at this time or heaviness in his chest.  The  complete blood count is well within normal limits.  The basic metabolic panel is essentially normal.  The anion gap is normal at 7.  The troponin is less than 0.03.  The electrocardiogram shows a normal sinus rhythm without any changes whatsoever.  Two-view chest x-ray shows no acute cardiopulmonary process.  I discussed the findings with the patient in terms of which he understands.  The patient states he is ready to go home and rest.  I have advised the patient to return if any changes in his chest, or any changes in his breathing, alteration in his level of consciousness, or deterioration in his general condition.  Patient is in agreement with this plan.   Final diagnoses:  Atypical chest pain    ED Discharge Orders    None       Ivery QualeBryant, Jowell Bossi, PA-C 11/07/17 1212    Eber HongMiller, Brian, MD 11/08/17 [id number]

## 2017-11-25 ENCOUNTER — Other Ambulatory Visit: Payer: Self-pay

## 2017-11-25 ENCOUNTER — Emergency Department (HOSPITAL_COMMUNITY)
Admission: EM | Admit: 2017-11-25 | Discharge: 2017-11-25 | Disposition: A | Discharge status: Home / Self Care | Payer: BLUE CROSS/BLUE SHIELD | Attending: Emergency Medicine | Admitting: Emergency Medicine

## 2017-11-25 ENCOUNTER — Emergency Department (HOSPITAL_COMMUNITY): Payer: BLUE CROSS/BLUE SHIELD

## 2017-11-25 ENCOUNTER — Encounter (HOSPITAL_COMMUNITY): Payer: Self-pay | Admitting: Emergency Medicine

## 2017-11-25 DIAGNOSIS — Z79899 Other long term (current) drug therapy: Secondary | ICD-10-CM | POA: Diagnosis not present

## 2017-11-25 DIAGNOSIS — J209 Acute bronchitis, unspecified: Secondary | ICD-10-CM | POA: Insufficient documentation

## 2017-11-25 DIAGNOSIS — F17210 Nicotine dependence, cigarettes, uncomplicated: Secondary | ICD-10-CM | POA: Insufficient documentation

## 2017-11-25 DIAGNOSIS — R05 Cough: Secondary | ICD-10-CM | POA: Diagnosis not present

## 2017-11-25 MED ORDER — IPRATROPIUM-ALBUTEROL 0.5-2.5 (3) MG/3ML IN SOLN
3.0000 mL | Freq: Once | RESPIRATORY_TRACT | Status: AC
  Administered 2017-11-25: 3 mL via RESPIRATORY_TRACT
  Filled 2017-11-25: qty 3

## 2017-11-25 MED ORDER — ONDANSETRON HCL 4 MG PO TABS
4.0000 mg | ORAL_TABLET | Freq: Once | ORAL | Status: AC
  Administered 2017-11-25: 4 mg via ORAL
  Filled 2017-11-25: qty 1

## 2017-11-25 MED ORDER — ALBUTEROL SULFATE HFA 108 (90 BASE) MCG/ACT IN AERS
2.0000 | INHALATION_SPRAY | Freq: Once | RESPIRATORY_TRACT | Status: AC
  Administered 2017-11-25: 2 via RESPIRATORY_TRACT
  Filled 2017-11-25: qty 6.7

## 2017-11-25 MED ORDER — PREDNISONE 50 MG PO TABS
60.0000 mg | ORAL_TABLET | Freq: Once | ORAL | Status: AC
  Administered 2017-11-25: 10:00:00 60 mg via ORAL
  Filled 2017-11-25: qty 1

## 2017-11-25 MED ORDER — DEXAMETHASONE 4 MG PO TABS: 4.0000 mg | ORAL_TABLET | Freq: Two times a day (BID) | ORAL | 0 refills | Status: DC

## 2017-11-25 NOTE — Discharge Instructions (Signed)
Your oxygen level is 97% on room air.  Your chest x-ray is negative for pneumonia, fluid on the lung, or any collapsed lung.  Your x-ray and your examination suggests bronchitis.  Please use albuterol 2 puffs every 4 hours.  Please use Decadron 2 times daily with food.  Please increase fluids.  Use the decongestant medication of your choice.  Wash hands frequently.  See your primary MD or return to the ED if any changes or problems.

## 2017-11-25 NOTE — ED Provider Notes (Signed)
[facility] EMERGENCY DEPARTMENT Provider Note   CSN: [id number] Arrival date & time: 11/25/17  4098     History   Chief Complaint Chief Complaint  Patient presents with  . Cough    HPI John [name] is a 41 y.o. male.  Patient is a 41 year old male who presents to the emergency department with cough and wheezing.  The patient states that over the past 2-3 days he has been having increasing cough that is usually nonproductive.  He has been having some wheezing, that is worse at night.  He reports some nasal congestion present.  No temperature changes that he is aware of.  No chills reported.  The patient denies being in any unsafe environments where he would be inhaling fumes or dust.  He states however that he usually gets bronchitis 2-3 times a year, and 1 of the episodes occurs about this time of the year.  Patient denies any hemoptysis.  There is been no injury or trauma to the chest.  No loss of consciousness is been reported.  The patient presents at this time for assistance with his breathing and also for evaluation of pain in his upper chest associated with coughing.   The history is provided by the patient.  Cough  Associated symptoms include wheezing. Pertinent negatives include no chest pain and no shortness of breath.    Past Medical History:  Diagnosis Date  . Anxiety   . Bronchitis   . Depression   . Opiate addiction (HCC)   . Panic attacks   . Pneumonia   . Testicular torsion     Patient Active Problem List   Diagnosis Date Noted  . CAP (community acquired pneumonia) 04/10/2016    Past Surgical History:  Procedure Laterality Date  . APPENDECTOMY    . EYE SURGERY    . NASAL SINUS SURGERY    . VASECTOMY      OB History    No data available       Home Medications    Prior to Admission medications   Medication Sig Start Date End Date Taking? Authorizing Provider  Buprenorphine HCl-Naloxone HCl (SUBOXONE) 8-2 MG FILM Place 1 Film under the  tongue daily.    Yes [provider]  gabapentin (NEURONTIN) 300 MG capsule Take 300 mg by mouth 4 (four) times daily.   Yes [provider]  clindamycin (CLEOCIN) 150 MG capsule Take 1 capsule (150 mg total) by mouth every 6 (six) hours. Patient not taking: Reported on 11/25/2017 09/08/17   Ivery Quale, PA-C    Family History Family History  Problem Relation Age of Onset  . Diabetes Mother   . Hypertension Father   . Cancer Other     Social History Social History   Tobacco Use  . Smoking status: Current Every Day Smoker    Packs/day: 1.50    Types: Cigarettes  . Smokeless tobacco: Never Used  Substance Use Topics  . Alcohol use: No  . Drug use: Yes    Types: Marijuana     Allergies   No known allergies and Other   Review of Systems Review of Systems  Constitutional: Negative for activity change.       All ROS Neg except as noted in HPI  HENT: Positive for congestion. Negative for nosebleeds.   Eyes: Negative for photophobia and discharge.  Respiratory: Positive for cough and wheezing. Negative for shortness of breath.        Chest wall pain  Cardiovascular: Negative for  chest pain and palpitations.  Gastrointestinal: Negative for abdominal pain and blood in stool.  Genitourinary: Negative for dysuria, frequency and hematuria.  Musculoskeletal: Negative for arthralgias, back pain and neck pain.  Skin: Negative.   Neurological: Negative for dizziness, seizures and speech difficulty.  Psychiatric/Behavioral: Negative for confusion and hallucinations.     Physical Exam Updated Vital Signs BP (!) 125/91 (BP Location: Right Arm)   Pulse 81   Temp 98.9 F (37.2 C) (Oral)   Resp 18   SpO2 97%   Physical Exam  Constitutional: He is oriented to person, place, and time. He appears well-developed and well-nourished.  Non-toxic appearance.  HENT:  Head: Normocephalic.  Right Ear: Tympanic membrane and external ear normal.  Left Ear: Tympanic  membrane and external ear normal.  Eyes: EOM and lids are normal. Pupils are equal, round, and reactive to light.  Neck: Normal range of motion. Neck supple. Carotid bruit is not present.  Cardiovascular: Normal rate, regular rhythm, normal heart sounds, intact distal pulses and normal pulses.  Pulmonary/Chest: No respiratory distress. He has wheezes. He has rhonchi.  Mild to moderate chest wall soreness.  There is symmetrical rise and fall of the chest.  The patient speaks in complete sentences without problem.  Abdominal: Soft. Bowel sounds are normal. There is no tenderness. There is no guarding.  Musculoskeletal: Normal range of motion.  Lymphadenopathy:       Head (right side): No submandibular adenopathy present.       Head (left side): No submandibular adenopathy present.    He has no cervical adenopathy.  Neurological: He is alert and oriented to person, place, and time. He has normal strength. No cranial nerve deficit or sensory deficit.  Skin: Skin is warm and dry.  Psychiatric: He has a normal mood and affect. His speech is normal.  Nursing note and vitals reviewed.    ED Treatments / Results  Labs (all labs ordered are listed, but only abnormal results are displayed) Labs Reviewed - No data to display  EKG  EKG Interpretation None       Radiology Dg Chest 2 View  Result Date: 11/25/2017 CLINICAL DATA:  Cough, congestion EXAM: CHEST  2 VIEW COMPARISON:  11/07/2017 FINDINGS: Heart and mediastinal contours are within normal limits. No focal opacities or effusions. No acute bony abnormality. IMPRESSION: No active cardiopulmonary disease. Electronically Signed   By: Charlett Nose M.D.   On: 11/25/2017 09:05    Procedures Procedures (including critical care time)  Medications Ordered in ED Medications  albuterol (PROVENTIL HFA;VENTOLIN HFA) 108 (90 Base) MCG/ACT inhaler 2 puff (not administered)  predniSONE (DELTASONE) tablet 60 mg (not administered)  ondansetron  (ZOFRAN) tablet 4 mg (not administered)  ipratropium-albuterol (DUONEB) 0.5-2.5 (3) MG/3ML nebulizer solution 3 mL (3 mLs Nebulization Given 11/25/17 0914)     Initial Impression / Assessment and Plan / ED Course  I have reviewed the triage vital signs and the nursing notes.  Pertinent labs & imaging results that were available during my care of the patient were reviewed by me and considered in my medical decision making (see chart for details).       Final Clinical Impressions(s) / ED Diagnoses MDM Vital signs reviewed.  Pulse oximetry is 97% on room air.  Patient speaks in complete sentences without problem.  There is symmetrical rise and fall of the chest.  No history of any hemoptysis.  Chest x-ray is negative for acute problems.  Patient receiving DuoNeb nebulizer treatment. Recheck. Wheezing still  present. Pt still working chest muscles to breathe. Albuterol inhaler given.  Recheck. Pt breathing easier. Sitting in chair working with his cell phone. Pt speaking in complete sentences. States he feels much better after treatments. Steroids also given. Wheezing and respiratory effort much improved.  Rx for decadron given. Pt given inhaler. Pt to follow up with PCP or return to ED if any changes or problem.   Final diagnoses:  Acute bronchitis, unspecified organism    ED Discharge Orders        Ordered    dexamethasone (DECADRON) 4 MG tablet  2 times daily with meals     11/25/17 1028       Ivery QualeBryant, Lainie Daubert, PA-C 11/25/17 1045    Bethann BerkshireZammit, Joseph, MD 11/25/17 [phone]

## 2017-11-25 NOTE — ED Triage Notes (Signed)
Pt hx of bronchitis. C/o dry cough x 2 day now. Using his home inhaler but only has a little left. Exp wheezing present in all fields.. Pain to upper chest and back with coughing.

## 2018-03-04 ENCOUNTER — Encounter (HOSPITAL_COMMUNITY): Payer: Self-pay | Admitting: Emergency Medicine

## 2018-03-04 ENCOUNTER — Other Ambulatory Visit: Payer: Self-pay

## 2018-03-04 ENCOUNTER — Emergency Department (HOSPITAL_COMMUNITY)
Admission: EM | Admit: 2018-03-04 | Discharge: 2018-03-04 | Disposition: A | Discharge status: Home / Self Care | Payer: BLUE CROSS/BLUE SHIELD | Attending: Emergency Medicine | Admitting: Emergency Medicine

## 2018-03-04 DIAGNOSIS — Z5321 Procedure and treatment not carried out due to patient leaving prior to being seen by health care provider: Secondary | ICD-10-CM | POA: Insufficient documentation

## 2018-03-04 DIAGNOSIS — R51 Headache: Secondary | ICD-10-CM | POA: Insufficient documentation

## 2018-03-04 DIAGNOSIS — R252 Cramp and spasm: Secondary | ICD-10-CM | POA: Diagnosis not present

## 2018-03-04 DIAGNOSIS — K0889 Other specified disorders of teeth and supporting structures: Secondary | ICD-10-CM | POA: Insufficient documentation

## 2018-03-04 NOTE — ED Notes (Signed)
Called no answer

## 2018-03-04 NOTE — ED Triage Notes (Signed)
Pt reports stomach spasms, pain in his head, and multiple broken and abscessed teeth. Currently not having N/V/D/ or fever. Denies being tx for dental abscess.

## 2018-04-20 ENCOUNTER — Encounter (HOSPITAL_COMMUNITY): Payer: Self-pay | Admitting: *Deleted

## 2018-04-20 ENCOUNTER — Other Ambulatory Visit: Payer: Self-pay

## 2018-04-20 ENCOUNTER — Emergency Department (HOSPITAL_COMMUNITY)
Admission: EM | Admit: 2018-04-20 | Discharge: 2018-04-20 | Disposition: A | Discharge status: Home / Self Care | Payer: BLUE CROSS/BLUE SHIELD | Attending: Emergency Medicine | Admitting: Emergency Medicine

## 2018-04-20 ENCOUNTER — Emergency Department (HOSPITAL_COMMUNITY): Payer: BLUE CROSS/BLUE SHIELD

## 2018-04-20 DIAGNOSIS — Y9389 Activity, other specified: Secondary | ICD-10-CM | POA: Diagnosis not present

## 2018-04-20 DIAGNOSIS — S52552A Other extraarticular fracture of lower end of left radius, initial encounter for closed fracture: Secondary | ICD-10-CM | POA: Diagnosis not present

## 2018-04-20 DIAGNOSIS — Z79899 Other long term (current) drug therapy: Secondary | ICD-10-CM | POA: Diagnosis not present

## 2018-04-20 DIAGNOSIS — W010XXA Fall on same level from slipping, tripping and stumbling without subsequent striking against object, initial encounter: Secondary | ICD-10-CM | POA: Diagnosis not present

## 2018-04-20 DIAGNOSIS — Y92410 Unspecified street and highway as the place of occurrence of the external cause: Secondary | ICD-10-CM | POA: Insufficient documentation

## 2018-04-20 DIAGNOSIS — S52592A Other fractures of lower end of left radius, initial encounter for closed fracture: Secondary | ICD-10-CM | POA: Diagnosis not present

## 2018-04-20 DIAGNOSIS — F17210 Nicotine dependence, cigarettes, uncomplicated: Secondary | ICD-10-CM | POA: Diagnosis not present

## 2018-04-20 DIAGNOSIS — S6982XA Other specified injuries of left wrist, hand and finger(s), initial encounter: Secondary | ICD-10-CM | POA: Diagnosis present

## 2018-04-20 DIAGNOSIS — Y990 Civilian activity done for income or pay: Secondary | ICD-10-CM | POA: Insufficient documentation

## 2018-04-20 DIAGNOSIS — S52515A Nondisplaced fracture of left radial styloid process, initial encounter for closed fracture: Secondary | ICD-10-CM | POA: Diagnosis not present

## 2018-04-20 MED ORDER — KETOROLAC TROMETHAMINE 60 MG/2ML IM SOLN
60.0000 mg | Freq: Once | INTRAMUSCULAR | Status: AC
  Administered 2018-04-20: 60 mg via INTRAMUSCULAR
  Filled 2018-04-20: qty 2

## 2018-04-20 MED ORDER — IBUPROFEN 800 MG PO TABS: 800.0000 mg | ORAL_TABLET | Freq: Three times a day (TID) | ORAL | 0 refills | Status: DC

## 2018-04-20 NOTE — Discharge Instructions (Addendum)
Use ice and elevation as much as possible for the next several days to help with pain and swelling.  Get rechecked immediately for any increased swelling, numbness or skin coloration changes in your fingertips.  Wear the splint at all times.

## 2018-04-20 NOTE — ED Triage Notes (Signed)
Swelling left wrist, injured at work, request that he not be given narcotics

## 2018-04-20 NOTE — ED Provider Notes (Signed)
[facility] EMERGENCY DEPARTMENT Provider Note   CSN: [id number] Arrival date & time: 04/20/18  1404     History   Chief Complaint Chief Complaint  Patient presents with  . Wrist Injury    HPI John [name] is a 42 y.o. 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 He reports pain and swelling in the wrist since the event with radiation into his 5th finger.  He denies proximal forearm or elbow pain and denies other injury. He has had no treatment prior to arriving here, but states is on suboxone for for prior opioid addiction.  The history is provided by the patient.    Past Medical History:  Diagnosis Date  . Anxiety   . Bronchitis   . Depression   . Opiate addiction (HCC)   . Panic attacks   . Pneumonia   . Testicular torsion     Patient Active Problem List   Diagnosis Date Noted  . CAP (community acquired pneumonia) 04/10/2016    Past Surgical History:  Procedure Laterality Date  . APPENDECTOMY    . EYE SURGERY    . NASAL SINUS SURGERY    . VASECTOMY       OB History   None      Home Medications    Prior to Admission medications   Medication Sig Start Date End Date Taking? Authorizing Provider  Buprenorphine HCl-Naloxone HCl (SUBOXONE) 8-2 MG FILM Place 1 Film under the tongue daily.     [provider]  clindamycin (CLEOCIN) 150 MG capsule Take 1 capsule (150 mg total) by mouth every 6 (six) hours. Patient not taking: Reported on 11/25/2017 09/08/17   Ivery QualeBryant, Hobson, PA-C  dexamethasone (DECADRON) 4 MG tablet Take 1 tablet (4 mg total) by mouth 2 (two) times daily with a meal. 11/25/17   Ivery QualeBryant, Hobson, PA-C  gabapentin (NEURONTIN) 300 MG capsule Take 300 mg by mouth 4 (four) times daily.    [provider]  ibuprofen (ADVIL,MOTRIN) 800 MG tablet Take 1 tablet (800 mg total) by mouth 3 (three) times daily. 04/20/18   Burgess AmorIdol,  Andjela Wickes, PA-C    Family History Family History  Problem Relation Age of Onset  . Diabetes Mother   . Hypertension Father   . Cancer Other     Social History Social History   Tobacco Use  . Smoking status: Current Every Day Smoker    Packs/day: 1.50    Types: Cigarettes  . Smokeless tobacco: Never Used  Substance Use Topics  . Alcohol use: No  . Drug use: Yes    Types: Marijuana    Comment: occasionally     Allergies   No known allergies and Other   Review of Systems Review of Systems  Constitutional: Negative for fever.  Musculoskeletal: Positive for arthralgias and joint swelling. Negative for myalgias.  Neurological: Negative for weakness and numbness.     Physical Exam Updated Vital Signs BP 127/88 (BP Location: Right Arm)   Pulse 67   Temp 99.9 F (37.7 C) (Tympanic)   Resp 20   Ht 5\' 10"  (1.778 m)   Wt 104.3 kg (230 lb)   SpO2 98%   BMI 33.00 kg/m   Physical Exam  Constitutional: He appears well-developed and well-nourished.  HENT:  Head: Atraumatic.  Neck: Normal range of motion.  Cardiovascular:  Pulses equal bilaterally  Musculoskeletal: He exhibits edema and  tenderness.       Left wrist: He exhibits bony tenderness and swelling. He exhibits no crepitus and no deformity.  Pt has pain in the left 5th finger, but no edema, deformity or reproducible pain with palpation.  Also no pain at the trapezius or other wrist bones.   Neurological: He is alert. He has normal strength. He displays normal reflexes. No sensory deficit.  Skin: Skin is warm and dry.  Psychiatric: He has a normal mood and affect.     ED Treatments / Results  Labs (all labs ordered are listed, but only abnormal results are displayed) Labs Reviewed - No data to display  EKG None  Radiology Dg Wrist Complete Left  Result Date: 04/20/2018 CLINICAL DATA:  Left wrist injury.  Initial encounter. EXAM: LEFT WRIST - COMPLETE 3+ VIEW COMPARISON:  None. FINDINGS: There likely is  a nondisplaced fracture near the base of the radial styloid that extends into the metaphyseal region of the distal radius. Carpal bones show normal alignment without evidence of fracture. Soft tissues are unremarkable. IMPRESSION: Radiographs are suspicious for a nondisplaced distal radial fracture at the base of the radial styloid. Electronically Signed   By: Irish Lack M.D.   On: 04/20/2018 15:40   Dg Hand Complete Left  Result Date: 04/20/2018 CLINICAL DATA:  Larey Seat today, pain radiating to little finger EXAM: LEFT HAND - COMPLETE 3+ VIEW COMPARISON:  None FINDINGS: Osseous mineralization normal. Joint spaces preserved. Nondisplaced distal LEFT radial metaphyseal fracture. On the oblique view, a longitudinal lucency is seen within the trapezoid, not seen on remaining views, uncertain if represents a nondisplaced longitudinal fracture or an artifact. No additional fracture, dislocation, or bone destruction. IMPRESSION: Nondisplaced distal LEFT radial metaphyseal fracture. Longitudinal lucency within the trapezoid only on the oblique view question nondisplaced fracture versus artifact; correlation for pain/tenderness at this site recommended. Electronically Signed   By: Ulyses Southward M.D.   On: 04/20/2018 16:33    Procedures Procedures (including critical care time)  SPLINT APPLICATION Date/Time: 4:51 PM Authorized by: [name] Consent: Verbal consent obtained. Risks and benefits: risks, benefits and alternatives were discussed Consent given by: patient Splint applied by: RN Location details: left wrist Splint type:sugar tong Supplies used: fiberglass, ace, webril Post-procedure: The splinted body part was neurovascularly unchanged following the procedure. Patient tolerance: Patient tolerated the procedure well with no immediate complications.     Medications Ordered in ED Medications  ketorolac (TORADOL) injection 60 mg (has no administration in time range)     Initial  Impression / Assessment and Plan / ED Course  I have reviewed the triage vital signs and the nursing notes.  Pertinent labs & imaging results that were available during my care of the patient were reviewed by me and considered in my medical decision making (see chart for details).     Sugar tong, splint provided.  toradol injection given. Advised rest, ice, elevation, splint care instructions.  Referral to ortho for f/u care.   Final Clinical Impressions(s) / ED Diagnoses   Final diagnoses:  Other closed extra-articular fracture of distal end of left radius, initial encounter    ED Discharge Orders        Ordered    ibuprofen (ADVIL,MOTRIN) 800 MG tablet  3 times daily     04/20/18 1642       [name], Cordelia Poche 04/20/18 1652    Molpus, Jonny Ruiz, MD 04/20/18 1934

## 2018-04-23 DIAGNOSIS — S52502D Unspecified fracture of the lower end of left radius, subsequent encounter for closed fracture with routine healing: Secondary | ICD-10-CM | POA: Diagnosis not present

## 2018-04-30 DIAGNOSIS — S52502D Unspecified fracture of the lower end of left radius, subsequent encounter for closed fracture with routine healing: Secondary | ICD-10-CM | POA: Diagnosis not present

## 2018-05-28 ENCOUNTER — Other Ambulatory Visit: Payer: Self-pay

## 2018-05-28 ENCOUNTER — Emergency Department
Admission: EM | Admit: 2018-05-28 | Discharge: 2018-05-28 | Disposition: A | Discharge status: Home / Self Care | Payer: BLUE CROSS/BLUE SHIELD | Attending: Emergency Medicine | Admitting: Emergency Medicine

## 2018-05-28 ENCOUNTER — Emergency Department: Payer: BLUE CROSS/BLUE SHIELD

## 2018-05-28 DIAGNOSIS — R0789 Other chest pain: Secondary | ICD-10-CM | POA: Diagnosis not present

## 2018-05-28 DIAGNOSIS — R0602 Shortness of breath: Secondary | ICD-10-CM | POA: Diagnosis not present

## 2018-05-28 DIAGNOSIS — R079 Chest pain, unspecified: Secondary | ICD-10-CM | POA: Diagnosis not present

## 2018-05-28 DIAGNOSIS — Z79899 Other long term (current) drug therapy: Secondary | ICD-10-CM | POA: Insufficient documentation

## 2018-05-28 DIAGNOSIS — F17210 Nicotine dependence, cigarettes, uncomplicated: Secondary | ICD-10-CM | POA: Diagnosis not present

## 2018-05-28 LAB — COMPREHENSIVE METABOLIC PANEL
ALT: 34 U/L (ref 17–63)
AST: 31 U/L (ref 15–41)
Albumin: 4.6 g/dL (ref 3.5–5.0)
Alkaline Phosphatase: 116 U/L (ref 38–126)
Anion gap: 10 (ref 5–15)
BILIRUBIN TOTAL: 0.5 mg/dL (ref 0.3–1.2)
BUN: 15 mg/dL (ref 6–20)
CO2: 22 mmol/L (ref 22–32)
CREATININE: 0.89 mg/dL (ref 0.61–1.24)
Calcium: 8.7 mg/dL — ABNORMAL LOW (ref 8.9–10.3)
Chloride: 103 mmol/L (ref 101–111)
GFR calc Af Amer: >60 mL/min (ref >60)
Glucose, Bld: 164 mg/dL — ABNORMAL HIGH (ref 65–99)
Potassium: 3.4 mmol/L — ABNORMAL LOW (ref 3.5–5.1)
Sodium: 135 mmol/L (ref 135–145)
TOTAL PROTEIN: 7.6 g/dL (ref 6.5–8.1)

## 2018-05-28 LAB — CBC WITH DIFFERENTIAL/PLATELET
BASOS ABS: 0 10*3/uL (ref 0–0.1)
Basophils Relative: 1 %
EOS ABS: 0.3 10*3/uL (ref 0–0.7)
EOS PCT: 3 %
HCT: 43.1 % (ref 40.0–52.0)
Hemoglobin: 14.9 g/dL (ref 13.0–18.0)
Lymphocytes Relative: 16 %
Lymphs Abs: 1.3 10*3/uL (ref 1.0–3.6)
MCH: 30.2 pg (ref 26.0–34.0)
MCHC: 34.7 g/dL (ref 32.0–36.0)
MCV: 87 fL (ref 80.0–100.0)
Monocytes Absolute: 0.6 10*3/uL (ref 0.2–1.0)
Monocytes Relative: 7 %
Neutro Abs: 6 10*3/uL (ref 1.4–6.5)
Neutrophils Relative %: 73 %
PLATELETS: 238 10*3/uL (ref 150–440)
RBC: 4.95 MIL/uL (ref 4.40–5.90)
RDW: 13.6 % (ref 11.5–14.5)
WBC: 8.1 10*3/uL (ref 3.8–10.6)

## 2018-05-28 LAB — TROPONIN I
Troponin I: <0.03 ng/mL (ref <0.03)
Troponin I: <0.03 ng/mL (ref <0.03)

## 2018-05-28 NOTE — ED Notes (Signed)
To Room 24, [facility] RN aware of placement.

## 2018-05-28 NOTE — ED Notes (Signed)
Pt reports that he had some chest pressure and "discomfort" this morning (4/10). He states that he has had panic attacks and can usually "breathe through" them, but it did not go away. He reports that it has now resolved. Pt alert & oriented with NAD noted.

## 2018-05-28 NOTE — ED Notes (Signed)
First Nurse Note:  Patient complaining of chest pain starting suddenly this AM.  Skin warm and dry.  To triage for EKG.

## 2018-05-28 NOTE — ED Triage Notes (Signed)
Pt reports chest pain that started at 830am while he was on the way to work - c/o shortness of breath, dizziness, and disorientation - chest pain is in center of chest that radiates into left shoulder

## 2018-05-28 NOTE — ED Notes (Signed)
Pt discharged home after verbalizing understanding of discharge instructions; nad noted. 

## 2018-05-28 NOTE — ED Provider Notes (Signed)
[facility] Emergency Department Provider Note  ____________________________________________  Time seen: Approximately 1:17 PM  I have reviewed the triage vital signs and the nursing notes.   HISTORY  Chief Complaint Chest Pain    HPI John [name] is a 42 y.o. male with a history of polysubstance abuse in recovery, pneumonia, anxiety and panic who complains of chest pain. The pain is intermittent, lasting 2-3 seconds at a time, no aggravating or alleviating factors, described as dull discomfort, nonradiating, no associated shortness of breath diaphoresis or vomiting. had a total of 3 discrete pain episodes between 7 AM and 8:30 AM today. Currently asymptomatic.     Past Medical History:  Diagnosis Date  . Anxiety   . Bronchitis   . Depression   . Opiate addiction (HCC)   . Panic attacks   . Pneumonia   . Testicular torsion      Patient Active Problem List   Diagnosis Date Noted  . CAP (community acquired pneumonia) 04/10/2016     Past Surgical History:  Procedure Laterality Date  . APPENDECTOMY    . EYE SURGERY    . NASAL SINUS SURGERY    . VASECTOMY       Prior to Admission medications   Medication Sig Start Date End Date Taking? Authorizing Provider  Buprenorphine HCl-Naloxone HCl (SUBOXONE) 8-2 MG FILM Place 1 Film under the tongue daily.     [provider]  clindamycin (CLEOCIN) 150 MG capsule Take 1 capsule (150 mg total) by mouth every 6 (six) hours. Patient not taking: Reported on 11/25/2017 09/08/17   Ivery Quale, PA-C  dexamethasone (DECADRON) 4 MG tablet Take 1 tablet (4 mg total) by mouth 2 (two) times daily with a meal. 11/25/17   Ivery Quale, PA-C  gabapentin (NEURONTIN) 300 MG capsule Take 300 mg by mouth 4 (four) times daily.    [provider]  ibuprofen (ADVIL,MOTRIN) 800 MG tablet Take 1 tablet (800 mg total) by mouth 3 (three) times daily. 04/20/18   [name], PA-C     Allergies No known  allergies and Other   Family History  Problem Relation Age of Onset  . Diabetes Mother   . Hypertension Father   . Cancer Other     Social History Social History   Tobacco Use  . Smoking status: Current Every Day Smoker    Packs/day: 2.00    Types: Cigarettes  . Smokeless tobacco: Never Used  Substance Use Topics  . Alcohol use: No  . Drug use: Yes    Types: Marijuana    Comment: occasionally    Review of Systems  Constitutional:   No fever or chills.  ENT:   No sore throat. No rhinorrhea. Cardiovascular:   positive as above chest pain without syncope. Respiratory:   No dyspnea or cough. Gastrointestinal:   Negative for abdominal pain, vomiting and diarrhea.  Musculoskeletal:   Negative for focal pain or swelling All other systems reviewed and are negative except as documented above in ROS and HPI.  ____________________________________________   PHYSICAL EXAM:  VITAL SIGNS: ED Triage Vitals  Enc Vitals Group     BP 05/28/18 1019 (!) 158/86     Pulse Rate 05/28/18 1019 69     Resp 05/28/18 1019 16     Temp 05/28/18 1019 97.6 F (36.4 C)     Temp Source 05/28/18 1019 Oral     SpO2 05/28/18 1019 97 %     Weight 05/28/18 1020 230 lb (104.3 kg)  Height 05/28/18 1020  (1.778 m)     Head Circumference --      Peak Flow --      Pain Score 05/28/18 1019 0     Pain Loc --      Pain Edu? --      Excl. in [place]? --     Vital signs reviewed, nursing assessments reviewed.   Constitutional:   Alert and oriented. Well appearing and in no distress. Eyes:   Conjunctivae are normal. EOMI. PERRL. ENT      Head:   Normocephalic and atraumatic.      Nose:   No congestion/rhinnorhea.       Mouth/Throat:   MMM, no pharyngeal erythema. No peritonsillar mass.       Neck:   No meningismus. Full ROM. Hematological/Lymphatic/Immunilogical:   No cervical lymphadenopathy. Cardiovascular:   RRR. Symmetric bilateral radial and DP pulses.  No murmurs.  Respiratory:    Normal respiratory effort without tachypnea/retractions. Breath sounds are clear and equal bilaterally. No wheezes/rales/rhonchi. Gastrointestinal:   Soft and nontender. Non distended. There is no CVA tenderness.  No rebound, rigidity, or guarding.  Musculoskeletal:   Normal range of motion in all extremities. No joint effusions.  No lower extremity tenderness.  No edema. Neurologic:   Normal speech and language.  Motor grossly intact. No acute focal neurologic deficits are appreciated.  Skin:    Skin is warm, dry and intact. No rash noted.  No petechiae, purpura, or bullae.  ____________________________________________    LABS (pertinent positives/negatives) (all labs ordered are listed, but only abnormal results are displayed) Labs Reviewed  COMPREHENSIVE METABOLIC PANEL - Abnormal; Notable for the following components:      Result Value   Potassium 3.4 (*)    Glucose, Bld 164 (*)    Calcium 8.7 (*)    All other components within normal limits  TROPONIN I  CBC WITH DIFFERENTIAL/PLATELET  TROPONIN I   ____________________________________________   EKG  interpreted by me  Date: 05/28/2018  Rate: 81  Rhythm: normal sinus rhythm  QRS Axis: normal  Intervals: normal  ST/T Wave abnormalities: normal  Conduction Disutrbances: none  Narrative Interpretation: unremarkable      ____________________________________________    RADIOLOGY  Dg Chest 2 View  Result Date: 05/28/2018 CLINICAL DATA:  Chest pain and shortness of breath EXAM: CHEST - 2 VIEW COMPARISON:  November 25, 2017 FINDINGS: Lungs are clear. Heart size and pulmonary vascularity are normal. No adenopathy. No bone lesions. No pneumothorax. IMPRESSION: No edema or consolidation. Electronically Signed   By: Bretta Bang III M.D.   On: 05/28/2018 10:48    ____________________________________________   PROCEDURES Procedures  ____________________________________________    CLINICAL IMPRESSION /  ASSESSMENT AND PLAN / ED COURSE  Pertinent labs & imaging results that were available during my care of the patient were reviewed by me and considered in my medical decision making (see chart for details).    most likely anxiety attack, possibly acid reflux. Low suspicion of ACS PE dissection or AAA pneumothorax or carditis or pneumonia. I'll check 2 troponins to evaluate for MI. If negative anything patient is suitable for discharge home given atypical nature of his symptoms.  Clinical Course as of May 28 1316  Thu May 28, 2018  1240 Pt very eager to be discharged, so troponin being sent now to expedite dispo since pt unwilling to wait any longer.    [PS]    Clinical Course User Index [PS] Sharman Cheek, MD     -----------------------------------------  1:19 PM on 05/28/2018 -----------------------------------------  EKG chest x-ray and labs are all normal. Patient is suitable for discharge home with routine primary care follow-up. I have encouraged the patient to cut back on smoking, although I think he is pre-contemplative at this time.  ____________________________________________   FINAL CLINICAL IMPRESSION(S) / ED DIAGNOSES    Final diagnoses:  Atypical chest pain     ED Discharge Orders    None      Portions of this note were generated with dragon dictation software. Dictation errors may occur despite best attempts at proofreading.    Sharman Cheek, MD 05/28/18 1320

## 2018-08-15 IMAGING — DX DG CHEST 2V
2 series · 2 of 2 positions shown · non-contrast
Comparison: 11/07/2017

CLINICAL DATA: Cough, congestion

EXAM:
CHEST  2 VIEW

[chest pa]
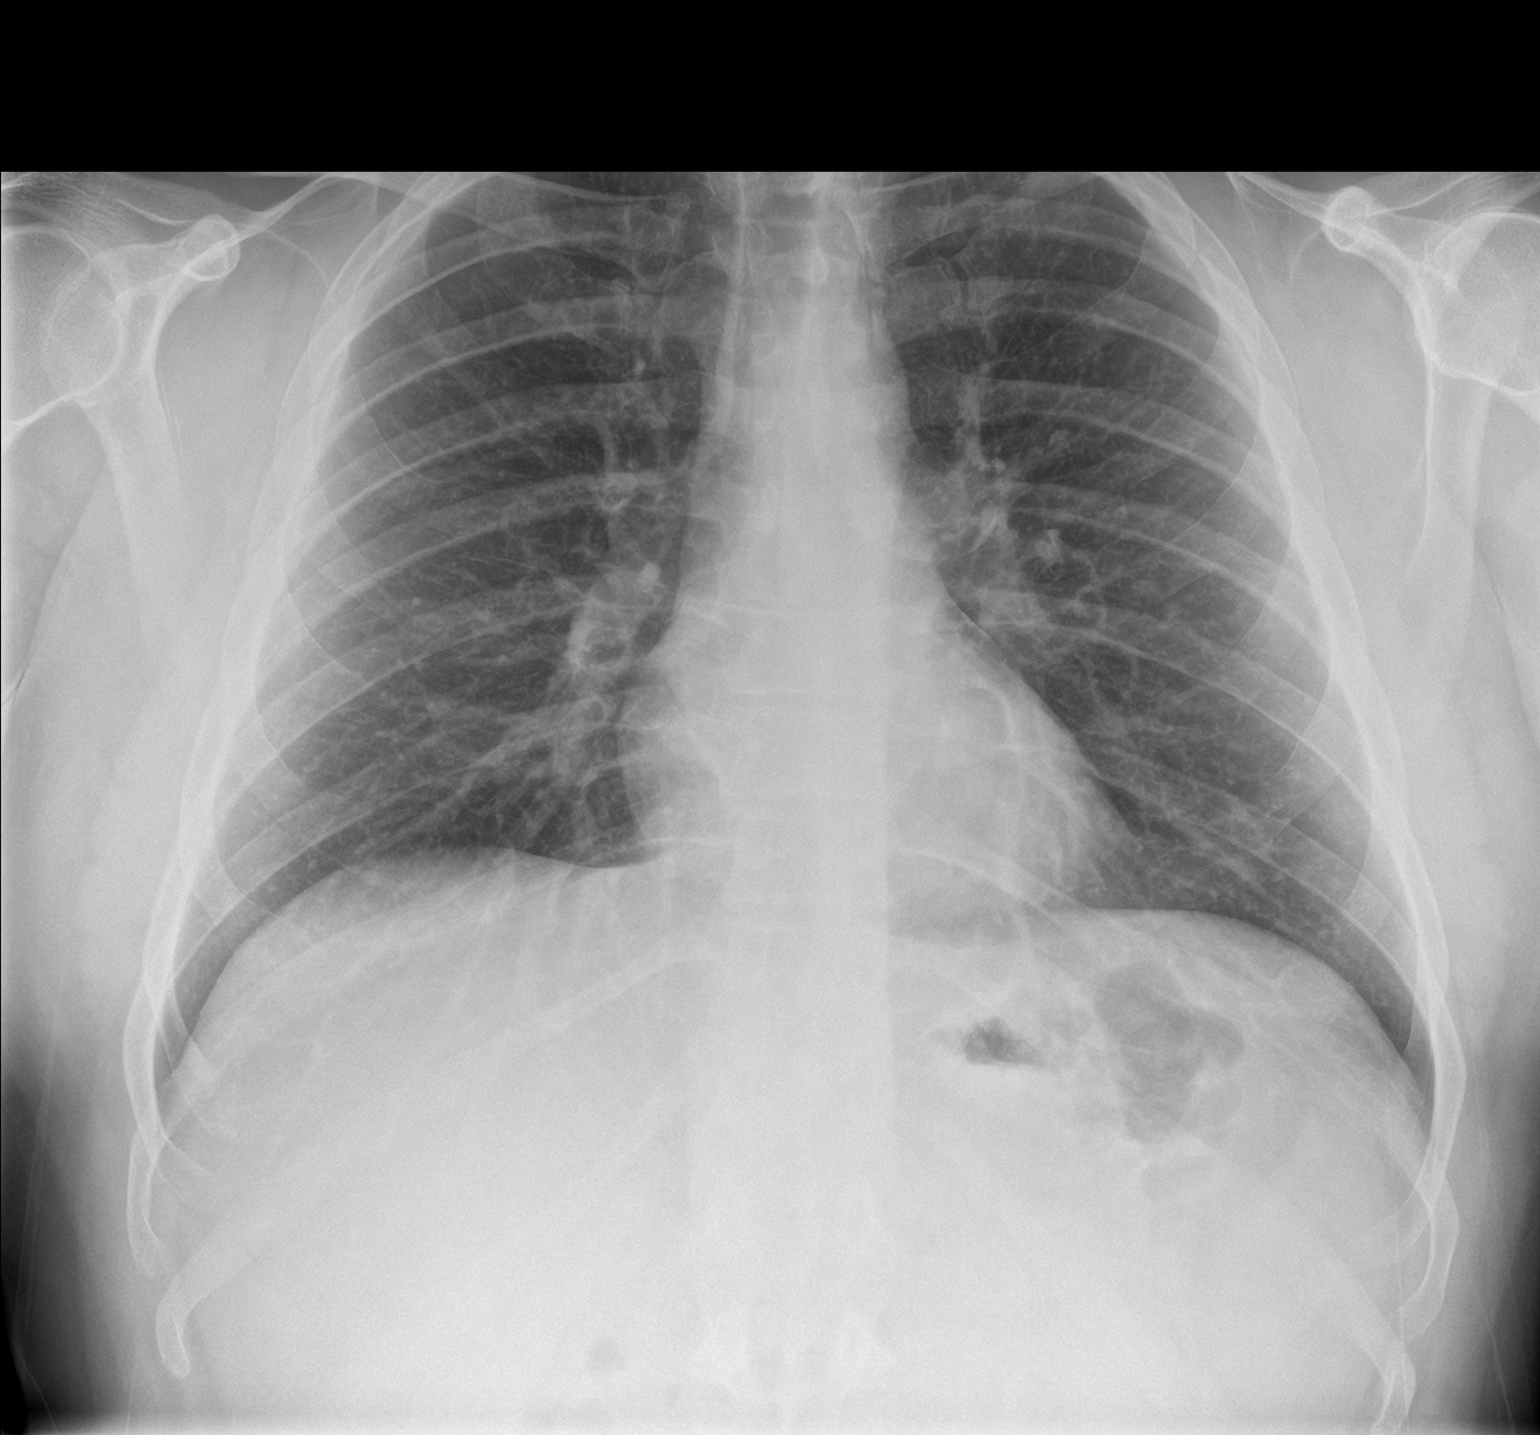

[chest lat]
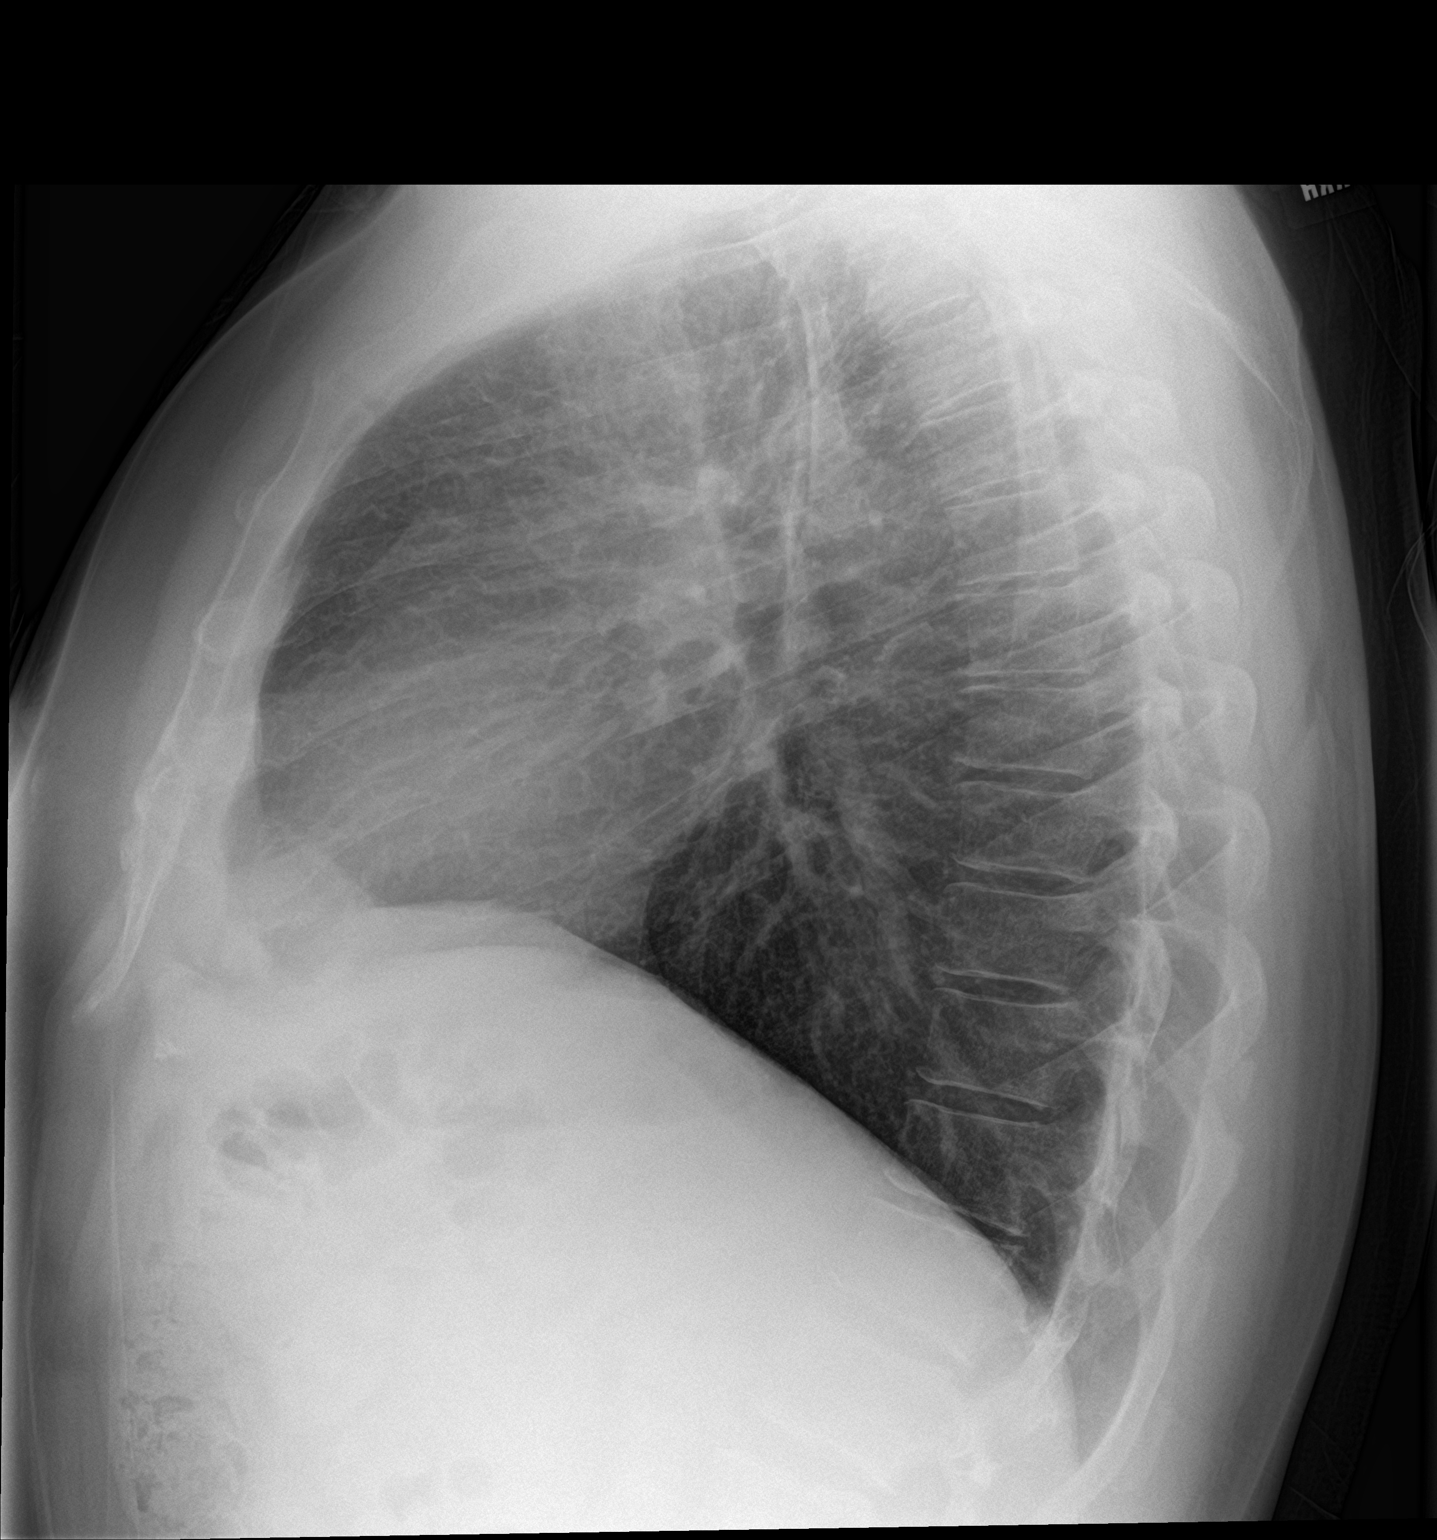

[2 of 2 positions shown; findings below may reference images not displayed]

FINDINGS: Heart and mediastinal contours are within normal limits. No focal
opacities or effusions. No acute bony abnormality.
IMPRESSION: No active cardiopulmonary disease.

## 2019-06-13 ENCOUNTER — Encounter: Payer: Self-pay | Admitting: Intensive Care

## 2019-06-13 ENCOUNTER — Emergency Department
Admission: EM | Admit: 2019-06-13 | Discharge: 2019-06-13 | Disposition: A | Discharge status: Home / Self Care | Payer: BC Managed Care – PPO | Attending: Emergency Medicine | Admitting: Emergency Medicine

## 2019-06-13 ENCOUNTER — Emergency Department: Payer: BC Managed Care – PPO

## 2019-06-13 ENCOUNTER — Other Ambulatory Visit: Payer: Self-pay

## 2019-06-13 DIAGNOSIS — Z20828 Contact with and (suspected) exposure to other viral communicable diseases: Secondary | ICD-10-CM | POA: Insufficient documentation

## 2019-06-13 DIAGNOSIS — J209 Acute bronchitis, unspecified: Secondary | ICD-10-CM | POA: Insufficient documentation

## 2019-06-13 DIAGNOSIS — R05 Cough: Secondary | ICD-10-CM | POA: Diagnosis not present

## 2019-06-13 DIAGNOSIS — R0602 Shortness of breath: Secondary | ICD-10-CM | POA: Diagnosis present

## 2019-06-13 DIAGNOSIS — F17210 Nicotine dependence, cigarettes, uncomplicated: Secondary | ICD-10-CM | POA: Diagnosis not present

## 2019-06-13 DIAGNOSIS — J40 Bronchitis, not specified as acute or chronic: Secondary | ICD-10-CM | POA: Diagnosis not present

## 2019-06-13 LAB — SARS CORONAVIRUS 2 BY RT PCR (HOSPITAL ORDER, PERFORMED IN ~~LOC~~ HOSPITAL LAB): SARS Coronavirus 2: NEGATIVE

## 2019-06-13 MED ORDER — PREDNISONE 20 MG PO TABS: 40.0000 mg | ORAL_TABLET | Freq: Every day | ORAL | 0 refills | Status: DC

## 2019-06-13 MED ORDER — AZITHROMYCIN 250 MG PO TABS: ORAL_TABLET | ORAL | 0 refills | Status: AC

## 2019-06-13 MED ORDER — ALBUTEROL SULFATE HFA 108 (90 BASE) MCG/ACT IN AERS: 2.0000 | INHALATION_SPRAY | Freq: Four times a day (QID) | RESPIRATORY_TRACT | 2 refills | Status: DC | PRN

## 2019-06-13 NOTE — ED Triage Notes (Signed)
Pt c/o cough X 1 week. Now experiencing back pain from coughing so much. Reports he is wheezing constantly making him cough often

## 2019-06-13 NOTE — ED Provider Notes (Signed)
[facility] Emergency Department Provider Note  Time seen: 4:27 PM  I have reviewed the triage vital signs and the nursing notes.   HISTORY  Chief Complaint Cough   HPI John [name] is a 43 y.o. adult with a past medical history of bronchitis, presents to the emergency department for shortness of breath.  According to the patient approximately 1 week ago he feels like he inhaled fabric fibers while drying himself off after shower time ever since that time he has had cough and wheeze.  Denies any fever.  Denies any sick contacts.  Patient does state mild shortness of breath as well.  Patient states every year he typically develops bronchitis requiring antibiotics and steroids as well as albuterol.  Has not albuterol inhaler but is only been using it rarely.  Denies any pain.  Largely negative review of systems.   Past Medical History:  Diagnosis Date  . Anxiety   . Bronchitis   . Depression   . Opiate addiction (HCC)   . Panic attacks   . Pneumonia   . Testicular torsion     Patient Active Problem List   Diagnosis Date Noted  . CAP (community acquired pneumonia) 04/10/2016    Past Surgical History:  Procedure Laterality Date  . APPENDECTOMY    . EYE SURGERY    . NASAL SINUS SURGERY    . VASECTOMY      Prior to Admission medications   Medication Sig Start Date End Date Taking? Authorizing Provider  Buprenorphine HCl-Naloxone HCl (SUBOXONE) 8-2 MG FILM Place 1 Film under the tongue daily.     [provider]  clindamycin (CLEOCIN) 150 MG capsule Take 1 capsule (150 mg total) by mouth every 6 (six) hours. Patient not taking: Reported on 11/25/2017 09/08/17   Ivery QualeBryant, Hobson, PA-C  dexamethasone (DECADRON) 4 MG tablet Take 1 tablet (4 mg total) by mouth 2 (two) times daily with a meal. 11/25/17   Ivery QualeBryant, Hobson, PA-C  gabapentin (NEURONTIN) 300 MG capsule Take 300 mg by mouth 4 (four) times daily.    [provider]  ibuprofen  (ADVIL,MOTRIN) 800 MG tablet Take 1 tablet (800 mg total) by mouth 3 (three) times daily. 04/20/18   Burgess AmorIdol, Julie, PA-C    Allergies  Allergen Reactions  . No Known Allergies   . Other     No narcotics please    Family History  Problem Relation Age of Onset  . Diabetes Mother   . Hypertension Father   . Cancer Other     Social History Social History   Tobacco Use  . Smoking status: Current Every Day Smoker    Packs/day: 2.00    Types: Cigarettes  . Smokeless tobacco: Never Used  Substance Use Topics  . Alcohol use: No  . Drug use: Not Currently    Types: Marijuana    Comment: takes suboxin    Review of Systems Constitutional: Negative for fever. Cardiovascular: Negative for chest pain. Respiratory: Positive for shortness of breath.  Positive cough.  Positive for wheeze. Gastrointestinal: Negative for abdominal pain Genitourinary: Negative for urinary compaints Musculoskeletal: Negative for musculoskeletal complaints Skin: Negative for skin complaints  Neurological: Negative for headache All other ROS negative  ____________________________________________   PHYSICAL EXAM:  VITAL SIGNS: ED Triage Vitals  Enc Vitals Group     BP 06/13/19 1555 (!) 161/89     Pulse Rate 06/13/19 1555 66     Resp 06/13/19 1555 16     Temp 06/13/19 1555  98.5 F (36.9 C)     Temp Source 06/13/19 1555 Oral     SpO2 06/13/19 1555 97 %     Weight 06/13/19 1555 220 lb (99.8 kg)     Height 06/13/19 1555 5\' 10"  (1.778 m)     Head Circumference --      Peak Flow --      Pain Score 06/13/19 1603 0     Pain Loc --      Pain Edu? --      Excl. in [place]? --     Constitutional: Alert and oriented. Well appearing and in no distress. Eyes: Normal exam ENT      Head: Normocephalic and atraumatic      Mouth/Throat: Mucous membranes are moist. Cardiovascular: Normal rate, regular rhythm. No murmur Respiratory: Normal respiratory effort without tachypnea nor retractions.  Patient has mild  bilateral wheezes, no obvious rales or rhonchi. Gastrointestinal: Soft and nontender. No distention Musculoskeletal: Nontender with normal range of motion in all extremities. Neurologic:  Normal speech and language. No gross focal neurologic deficits  Skin:  Skin is warm, dry and intact.  Psychiatric: Mood and affect are normal.  ____________________________________________     RADIOLOGY  Chest x-ray negative  ____________________________________________   INITIAL IMPRESSION / ASSESSMENT AND PLAN / ED COURSE  Pertinent labs & imaging results that were available during my care of the patient were reviewed by me and considered in my medical decision making (see chart for details).   Patient presents emergency department with cough and shortness of breath as well as wheeze over the past 5 to 7 days.  Patient has mild expiratory wheezes bilaterally on my exam.  Reassuringly satting 97% on room air and is afebrile.  We will check a coronavirus test.  If coronavirus is negative we will cover for acute bronchitis with Zithromax and prednisone as well as refill the patient's albuterol inhaler which he states is almost out.  If positive we will still refill the patient's albuterol inhaler, supportive care at home.  Patient agreeable.  X-rays negative.  COVID is negative.  We will place the patient on 5 days of prednisone as well as Zithromax for acute bronchitis.  I will also refill the patient's albuterol inhaler.  Patient agreeable plan of care.  John [name] was evaluated in Emergency Department on 06/13/2019 for the symptoms described in the history of present illness. He was evaluated in the context of the global COVID-19 pandemic, which necessitated consideration that the patient might be at risk for infection with the SARS-CoV-2 virus that causes COVID-19. Institutional protocols and algorithms that pertain to the evaluation of patients at risk for COVID-19 are in a state of rapid change  based on information released by regulatory bodies including the CDC and federal and state organizations. These policies and algorithms were followed during the patient's care in the ED.  ____________________________________________   FINAL CLINICAL IMPRESSION(S) / ED DIAGNOSES  Dyspnea Cough   Harvest Dark, MD 06/13/19 [phone]

## 2019-07-01 DIAGNOSIS — Z6833 Body mass index (BMI) 33.0-33.9, adult: Secondary | ICD-10-CM | POA: Diagnosis not present

## 2019-07-01 DIAGNOSIS — Z0001 Encounter for general adult medical examination with abnormal findings: Secondary | ICD-10-CM | POA: Diagnosis not present

## 2019-07-01 DIAGNOSIS — J410 Simple chronic bronchitis: Secondary | ICD-10-CM | POA: Diagnosis not present

## 2019-12-13 DIAGNOSIS — F17200 Nicotine dependence, unspecified, uncomplicated: Secondary | ICD-10-CM | POA: Diagnosis not present

## 2019-12-13 DIAGNOSIS — J209 Acute bronchitis, unspecified: Secondary | ICD-10-CM | POA: Diagnosis not present

## 2019-12-13 DIAGNOSIS — F17210 Nicotine dependence, cigarettes, uncomplicated: Secondary | ICD-10-CM | POA: Diagnosis not present

## 2020-05-24 ENCOUNTER — Emergency Department
Admission: EM | Admit: 2020-05-24 | Discharge: 2020-05-24 | Discharge status: Against Medical Advice | Payer: BC Managed Care – PPO

## 2020-05-24 DIAGNOSIS — S66911A Strain of unspecified muscle, fascia and tendon at wrist and hand level, right hand, initial encounter: Secondary | ICD-10-CM | POA: Diagnosis not present

## 2020-05-24 DIAGNOSIS — S63501A Unspecified sprain of right wrist, initial encounter: Secondary | ICD-10-CM | POA: Diagnosis not present

## 2020-05-24 NOTE — ED Notes (Signed)
Pt called to be triaged with no answer.

## 2020-07-24 ENCOUNTER — Emergency Department
Admission: EM | Admit: 2020-07-24 | Discharge: 2020-07-24 | Disposition: A | Discharge status: Home / Self Care | Payer: BC Managed Care – PPO | Attending: Emergency Medicine | Admitting: Emergency Medicine

## 2020-07-24 ENCOUNTER — Emergency Department: Payer: BC Managed Care – PPO

## 2020-07-24 ENCOUNTER — Encounter: Payer: Self-pay | Admitting: Emergency Medicine

## 2020-07-24 ENCOUNTER — Other Ambulatory Visit: Payer: Self-pay

## 2020-07-24 DIAGNOSIS — M7711 Lateral epicondylitis, right elbow: Secondary | ICD-10-CM | POA: Insufficient documentation

## 2020-07-24 DIAGNOSIS — Y990 Civilian activity done for income or pay: Secondary | ICD-10-CM | POA: Diagnosis not present

## 2020-07-24 DIAGNOSIS — S59911A Unspecified injury of right forearm, initial encounter: Secondary | ICD-10-CM | POA: Diagnosis not present

## 2020-07-24 DIAGNOSIS — F17210 Nicotine dependence, cigarettes, uncomplicated: Secondary | ICD-10-CM | POA: Insufficient documentation

## 2020-07-24 DIAGNOSIS — S56911A Strain of unspecified muscles, fascia and tendons at forearm level, right arm, initial encounter: Secondary | ICD-10-CM

## 2020-07-24 DIAGNOSIS — Y939 Activity, unspecified: Secondary | ICD-10-CM | POA: Diagnosis not present

## 2020-07-24 DIAGNOSIS — X58XXXA Exposure to other specified factors, initial encounter: Secondary | ICD-10-CM | POA: Insufficient documentation

## 2020-07-24 DIAGNOSIS — Y929 Unspecified place or not applicable: Secondary | ICD-10-CM | POA: Insufficient documentation

## 2020-07-24 DIAGNOSIS — S59901A Unspecified injury of right elbow, initial encounter: Secondary | ICD-10-CM | POA: Diagnosis not present

## 2020-07-24 NOTE — Discharge Instructions (Addendum)
Your x-ray is normal and your exam is concerning for possible tennis elbow or epicondylitis.  Ideally a counterforce strap would be used to treat this type of injury.  We will place her in a wrist cock-up splint, and have you follow-up with orthopedics for ongoing symptom management.  Take over-the-counter ibuprofen for Motrin as needed.  You may also do ice massage to this area for symptom relief.  Return to the ED as needed.

## 2020-07-24 NOTE — ED Notes (Signed)
Pt presents to the ED after he pulling heavy things on Saturday. Pt states he heard a pop. Pt states his R elbow is very painful but pt is able to move arm freely. Pt able to move fingers. Strong pulse radially. Pt is A&Ox4 and NAD at this time.

## 2020-07-24 NOTE — ED Notes (Signed)
E-signature not working at this time. Pt verbalized understanding of D/C instructions, prescriptions and follow up care with no further questions at this time. Pt in NAD and ambulatory at time of D/C.  

## 2020-07-24 NOTE — ED Provider Notes (Signed)
[facility] Emergency Department Provider Note ____________________________________________  Time seen: 1241  I have reviewed the triage vital signs and the nursing notes.  HISTORY  Chief Complaint  Arm Injury  HPI John [name] is a 44 y.o. adult that presents to the ED for evaluation of right forearm pain.  Patient describes injury that occurred at work  on Saturday.  Patient reports feeling a pop in his right lateral elbow, with referral down the right dorsal forearm.  Patient denies any grip changes, distal paresthesias, or skin/temp changes.  No bruising or ecchymosis is reported.  Since that time the patient has had increasing pain to the lateral forearm and elbow with wrist flexion extension.  He denies any change in grip strength at this time.  No other trauma or fall on outstretched hand is reported.  No history of chronic ongoing wrist or elbow problems.  Past Medical History:  Diagnosis Date  . Anxiety   . Bronchitis   . Depression   . Opiate addiction (HCC)   . Panic attacks   . Pneumonia   . Testicular torsion     Patient Active Problem List   Diagnosis Date Noted  . CAP (community acquired pneumonia) 04/10/2016    Past Surgical History:  Procedure Laterality Date  . APPENDECTOMY    . EYE SURGERY    . NASAL SINUS SURGERY    . VASECTOMY      Prior to Admission medications   Medication Sig Start Date End Date Taking? Authorizing Provider  albuterol (VENTOLIN HFA) 108 (90 Base) MCG/ACT inhaler Inhale 2 puffs into the lungs every 6 (six) hours as needed for wheezing or shortness of breath. 06/13/19   Minna Antis, MD  Buprenorphine HCl-Naloxone HCl (SUBOXONE) 8-2 MG FILM Place 1 Film under the tongue daily.     [provider]  gabapentin (NEURONTIN) 300 MG capsule Take 300 mg by mouth 4 (four) times daily.    [provider]    Allergies No known allergies and Other  Family History  Problem Relation Age of Onset   . Diabetes Mother   . Hypertension Father   . Cancer Other     Social History Social History   Tobacco Use  . Smoking status: Current Every Day Smoker    Packs/day: 2.00    Types: Cigarettes  . Smokeless tobacco: Never Used  Vaping Use  . Vaping Use: Never used  Substance Use Topics  . Alcohol use: No  . Drug use: Not Currently    Types: Marijuana    Comment: takes suboxin    Review of Systems  Constitutional: Negative for fever. Cardiovascular: Negative for chest pain. Respiratory: Negative for shortness of breath. Musculoskeletal: Negative for back pain.  Right forearm and elbow pain as above. Skin: Negative for rash. Neurological: Negative for headaches, focal weakness or numbness. ____________________________________________  PHYSICAL EXAM:  VITAL SIGNS: ED Triage Vitals  Enc Vitals Group     BP 07/24/20 1148 (!) 151/65     Pulse Rate 07/24/20 1148 61     Resp 07/24/20 1148 18     Temp 07/24/20 1148 98.2 F (36.8 C)     Temp Source 07/24/20 1148 Oral     SpO2 07/24/20 1148 97 %     Weight 07/24/20 1135 (!) 220 lb (99.8 kg)     Height 07/24/20 1135 5\' 10"  (1.778 m)     Head Circumference --      Peak Flow --  Pain Score 07/24/20 1135 7     Pain Loc --      Pain Edu? --      Excl. in [place]? --     Constitutional: Alert and oriented. Well appearing and in no distress. Head: Normocephalic and atraumatic. Eyes: Conjunctivae are normal. Normal extraocular movements Cardiovascular: Normal rate, regular rhythm. Normal distal pulses. Respiratory: Normal respiratory effort.  Musculoskeletal: Right forearm without any obvious deformity dislocation, joint effusion or ecchymosis.  Normal flexion extension range to the elbow.  Pronation supination is normal to the forearm.  Tender to palpation to the extensor forearm musculature proximally.  Not exquisitely tender to palpation over the lateral epicondyle.  Composite grip strength noted and intact bilaterally.   Negative Finkelstein on exam.  Nontender with normal range of motion in all extremities.  Neurologic: Cranial nerves II through XII grossly intact.  Normal intrinsic and opposition testing.  Negative cubital or carpal Tinel's.  Normal gait without ataxia. Normal speech and language. No gross focal neurologic deficits are appreciated. Skin:  Skin is warm, dry and intact. No rash noted. Psychiatric: Mood and affect are normal. Patient exhibits appropriate insight and judgment. ____________________________________________   RADIOLOGY  DG Right Elbow  Negative ____________________________________________  PROCEDURES  Wrist cock-up splint Procedures ____________________________________________  INITIAL IMPRESSION / ASSESSMENT AND PLAN / ED COURSE  Patient ED evaluation of right forearm muscle strain following a mechanical injury.  Concerning possibilities include traumatic epicondylitis laterally.  Muscle strain, or tendon rupture.  X-rays negative for any acute fracture or dislocation.  Patient without any acute tenderness to palpation of the lateral epicondyles.  Patient likely represents a traumatic strain to the right extensor musculature.  He will be placed in a wrist cock-up splint for support.  He is advised to follow-up with orthopedics for ongoing symptom management.  He may take over-the-counter or previously prescribed ibuprofen for pain relief.  Return precautions have been discussed.  John [name] was evaluated in Emergency Department on 07/24/2020 for the symptoms described in the history of present illness. He was evaluated in the context of the global COVID-19 pandemic, which necessitated consideration that the patient might be at risk for infection with the SARS-CoV-2 virus that causes COVID-19. Institutional protocols and algorithms that pertain to the evaluation of patients at risk for COVID-19 are in a state of rapid change based on information released by regulatory bodies  including the CDC and federal and state organizations. These policies and algorithms were followed during the patient's care in the ED. ____________________________________________  FINAL CLINICAL IMPRESSION(S) / ED DIAGNOSES  Final diagnoses:  Lateral epicondylitis of right elbow  Forearm strain, right, initial encounter      Lissa Hoard, PA-C 07/24/20 1408    Gilles Chiquito, MD 07/24/20 [phone]

## 2020-07-24 NOTE — ED Triage Notes (Signed)
Pt reports was pulling things Saturday and felt a pop in his right elbow and now still painful. Pt requests that no opioids be offered or given to him

## 2020-10-05 ENCOUNTER — Emergency Department
Admission: EM | Admit: 2020-10-05 | Discharge: 2020-10-05 | Disposition: A | Discharge status: Home / Self Care | Payer: BC Managed Care – PPO | Attending: Emergency Medicine | Admitting: Emergency Medicine

## 2020-10-05 ENCOUNTER — Encounter: Payer: Self-pay | Admitting: Emergency Medicine

## 2020-10-05 ENCOUNTER — Emergency Department: Payer: BC Managed Care – PPO

## 2020-10-05 ENCOUNTER — Other Ambulatory Visit: Payer: Self-pay

## 2020-10-05 DIAGNOSIS — Z20822 Contact with and (suspected) exposure to covid-19: Secondary | ICD-10-CM | POA: Insufficient documentation

## 2020-10-05 DIAGNOSIS — J209 Acute bronchitis, unspecified: Secondary | ICD-10-CM | POA: Diagnosis not present

## 2020-10-05 DIAGNOSIS — F17210 Nicotine dependence, cigarettes, uncomplicated: Secondary | ICD-10-CM | POA: Insufficient documentation

## 2020-10-05 DIAGNOSIS — R0981 Nasal congestion: Secondary | ICD-10-CM | POA: Insufficient documentation

## 2020-10-05 DIAGNOSIS — R059 Cough, unspecified: Secondary | ICD-10-CM | POA: Diagnosis not present

## 2020-10-05 LAB — RESPIRATORY PANEL BY RT PCR (FLU A&B, COVID)
Influenza A by PCR: NEGATIVE
Influenza B by PCR: NEGATIVE
SARS Coronavirus 2 by RT PCR: NEGATIVE

## 2020-10-05 MED ORDER — AZITHROMYCIN 250 MG PO TABS: ORAL_TABLET | ORAL | 0 refills | Status: DC

## 2020-10-05 MED ORDER — PREDNISONE 10 MG (21) PO TBPK: ORAL_TABLET | ORAL | 0 refills | Status: DC

## 2020-10-05 MED ORDER — IPRATROPIUM-ALBUTEROL 0.5-2.5 (3) MG/3ML IN SOLN
3.0000 mL | Freq: Once | RESPIRATORY_TRACT | Status: AC
  Administered 2020-10-05: 3 mL via RESPIRATORY_TRACT
  Filled 2020-10-05: qty 3

## 2020-10-05 NOTE — ED Triage Notes (Signed)
Pt states that he has bronchitis again. Pt states that he gets it every year. Pt reports cough and nasal congestion for 4-5 days.

## 2020-10-05 NOTE — Discharge Instructions (Signed)
Follow-up with your regular doctor if not better in 3 to 4 days.  Return emergency department worsening.  Take your medication as prescribed.

## 2020-10-05 NOTE — ED Notes (Signed)
Checked for patient in room and pt was not in there

## 2020-10-05 NOTE — ED Notes (Signed)
Pt not in room for discharge 

## 2020-10-05 NOTE — ED Provider Notes (Signed)
[name] Provider Note  ____________________________________________   First MD Initiated Contact with Patient 10/05/20 1154     (approximate)  I have reviewed the triage vital signs and the nursing notes.   HISTORY  Chief Complaint Cough and Nasal Congestion    HPI [name] is a 44 y.o. adult presents emergency department complaining of bronchitis.  Said he C gets it every year.  No fever or chills.  No known exposure to Covid.  Patient states he will not get the Covid vaccine and does not want to be talked to about it.  He states it is a political thing for him.  States he does have cough and wheezing.  Has an inhaler refill but does not have any steroids or antibiotics at home.    Past Medical History:  Diagnosis Date  . Anxiety   . Bronchitis   . Depression   . Opiate addiction (HCC)   . Panic attacks   . Pneumonia   . Testicular torsion     Patient Active Problem List   Diagnosis Date Noted  . CAP (community acquired pneumonia) 04/10/2016    Past Surgical History:  Procedure Laterality Date  . APPENDECTOMY    . EYE SURGERY    . NASAL SINUS SURGERY    . VASECTOMY      Prior to Admission medications   Medication Sig Start Date End Date Taking? Authorizing Provider  albuterol (VENTOLIN HFA) 108 (90 Base) MCG/ACT inhaler Inhale 2 puffs into the lungs every 6 (six) hours as needed for wheezing or shortness of breath. 06/13/19   Minna Antis, MD  azithromycin (ZITHROMAX Z-PAK) 250 MG tablet 2 pills today then 1 pill a day for 4 days 10/05/20   Sherrie Mustache Roselyn Bering, PA-C  Buprenorphine HCl-Naloxone HCl (SUBOXONE) 8-2 MG FILM Place 1 Film under the tongue daily.     [provider]  gabapentin (NEURONTIN) 300 MG capsule Take 300 mg by mouth 4 (four) times daily.    [provider]  predniSONE (STERAPRED UNI-PAK 21 TAB) 10 MG (21) TBPK tablet Take 6 pills on day one then decrease by 1 pill each day  10/05/20   Faythe Ghee, PA-C    Allergies No known allergies and Other  Family History  Problem Relation Age of Onset  . Diabetes Mother   . Hypertension Father   . Cancer Other     Social History Social History   Tobacco Use  . Smoking status: Current Every Day Smoker    Packs/day: 2.00    Types: Cigarettes  . Smokeless tobacco: Never Used  Vaping Use  . Vaping Use: Never used  Substance Use Topics  . Alcohol use: No  . Drug use: Not Currently    Types: Marijuana    Comment: takes suboxin    Review of Systems  Constitutional: No fever/chills Eyes: No visual changes. ENT: No sore throat. Respiratory: Positive cough and wheezing Cardiovascular: Denies chest pain Gastrointestinal: Denies abdominal pain Genitourinary: Negative for dysuria. Musculoskeletal: Negative for back pain. Skin: Negative for rash. Psychiatric: no mood changes,     ____________________________________________   PHYSICAL EXAM:  VITAL SIGNS: ED Triage Vitals  Enc Vitals Group     BP 10/05/20 1058 138/68     Pulse Rate 10/05/20 1058 (!) 58     Resp 10/05/20 1058 18     Temp 10/05/20 1058 97.8 F (36.6 C)     Temp Source 10/05/20 1058 Oral  SpO2 10/05/20 1058 96 %     Weight 10/05/20 1032 220 lb (99.8 kg)     Height 10/05/20 1032 5\' 10"  (1.778 m)     Head Circumference --      Peak Flow --      Pain Score 10/05/20 1032 0     Pain Loc --      Pain Edu? --      Excl. in GC? --     Constitutional: Alert and oriented. Well appearing and in no acute distress. Eyes: Conjunctivae are normal.  Head: Atraumatic. Nose: No congestion/rhinnorhea. Mouth/Throat: Mucous membranes are moist.   Neck:  supple no lymphadenopathy noted Cardiovascular: Normal rate, regular rhythm. Heart sounds are normal Respiratory: Normal respiratory effort.  No retractions, lungs with wheezing bilaterally Abd: soft nontender bs normal all 4 quad GU: deferred Musculoskeletal: FROM all extremities,  warm and well perfused Neurologic:  Normal speech and language.  Skin:  Skin is warm, dry and intact. No rash noted. Psychiatric: Mood and affect are normal. Speech and behavior are normal.  ____________________________________________   LABS (all labs ordered are listed, but only abnormal results are displayed)  Labs Reviewed  RESPIRATORY PANEL BY RT PCR (FLU A&B, COVID)   ____________________________________________   ____________________________________________  RADIOLOGY  Chest x-ray is negative  ____________________________________________   PROCEDURES  Procedure(s) performed: DuoNeb   Procedures    ____________________________________________   INITIAL IMPRESSION / ASSESSMENT AND PLAN / ED COURSE  Pertinent labs & imaging results that were available during my care of the patient were reviewed by me and considered in my medical decision making (see chart for details).   Patient is 44 year old male presents emergency department with URI/bronchitis symptoms.  See HPI.  Physical exam is consistent with acute bronchitis/Covid.  Chest x-ray was normal Covid test ordered, patient will be given a DuoNeb here in the ED.  Prescription for Z-Pak steroid pack at discharge.  He has refills on his inhaler that he can obtain.   DuoNeb did help the patient feel better.  Decreased wheezing and increased air movement noted.  Follow-up with his regular doctor.  Return if worsening.  [name] 56 was evaluated in Emergency Department on 10/05/2020 for the symptoms described in the history of present illness. He was evaluated in the context of the global COVID-19 pandemic, which necessitated consideration that the patient might be at risk for infection with the SARS-CoV-2 virus that causes COVID-19. Institutional protocols and algorithms that pertain to the evaluation of patients at risk for COVID-19 are in a state of rapid change based on information released by regulatory bodies  including the CDC and federal and state organizations. These policies and algorithms were followed during the patient's care in the ED.    As part of my medical decision making, I reviewed the following data within the electronic MEDICAL RECORD NUMBER Nursing notes reviewed and incorporated, Old chart reviewed, Radiograph reviewed , Notes from prior ED visits and [place] Controlled Substance Database  ____________________________________________   FINAL CLINICAL IMPRESSION(S) / ED DIAGNOSES  Final diagnoses:  Acute bronchitis, unspecified organism      NEW MEDICATIONS STARTED DURING THIS VISIT:  Discharge Medication List as of 10/05/2020  1:31 PM    START taking these medications   Details  azithromycin (ZITHROMAX Z-PAK) 250 MG tablet 2 pills today then 1 pill a day for 4 days, Normal    predniSONE (STERAPRED UNI-PAK 21 TAB) 10 MG (21) TBPK tablet Take 6 pills on day one then decrease by  1 pill each day, Normal         Note:  This document was prepared using Dragon voice recognition software and may include unintentional dictation errors.    Faythe Ghee, PA-C 10/05/20 1551    Jene Every, MD 10/06/20 0800

## 2020-12-05 DIAGNOSIS — F1120 Opioid dependence, uncomplicated: Secondary | ICD-10-CM | POA: Diagnosis not present

## 2020-12-05 DIAGNOSIS — Z7151 Drug abuse counseling and surveillance of drug abuser: Secondary | ICD-10-CM | POA: Diagnosis not present

## 2020-12-08 ENCOUNTER — Emergency Department
Admission: EM | Admit: 2020-12-08 | Discharge: 2020-12-08 | Discharge status: Against Medical Advice | Payer: BC Managed Care – PPO

## 2020-12-09 DIAGNOSIS — M542 Cervicalgia: Secondary | ICD-10-CM | POA: Diagnosis not present

## 2020-12-09 DIAGNOSIS — R0789 Other chest pain: Secondary | ICD-10-CM | POA: Diagnosis not present

## 2020-12-09 DIAGNOSIS — M25511 Pain in right shoulder: Secondary | ICD-10-CM | POA: Diagnosis not present

## 2020-12-09 DIAGNOSIS — S7001XA Contusion of right hip, initial encounter: Secondary | ICD-10-CM | POA: Diagnosis not present

## 2020-12-14 DIAGNOSIS — M9907 Segmental and somatic dysfunction of upper extremity: Secondary | ICD-10-CM | POA: Diagnosis not present

## 2020-12-14 DIAGNOSIS — M9901 Segmental and somatic dysfunction of cervical region: Secondary | ICD-10-CM | POA: Diagnosis not present

## 2020-12-14 DIAGNOSIS — S43401A Unspecified sprain of right shoulder joint, initial encounter: Secondary | ICD-10-CM | POA: Diagnosis not present

## 2020-12-14 DIAGNOSIS — M9902 Segmental and somatic dysfunction of thoracic region: Secondary | ICD-10-CM | POA: Diagnosis not present

## 2021-01-22 DIAGNOSIS — F1120 Opioid dependence, uncomplicated: Secondary | ICD-10-CM | POA: Diagnosis not present

## 2021-01-22 DIAGNOSIS — Z7151 Drug abuse counseling and surveillance of drug abuser: Secondary | ICD-10-CM | POA: Diagnosis not present

## 2021-03-05 DIAGNOSIS — Z7151 Drug abuse counseling and surveillance of drug abuser: Secondary | ICD-10-CM | POA: Diagnosis not present

## 2021-03-05 DIAGNOSIS — F1120 Opioid dependence, uncomplicated: Secondary | ICD-10-CM | POA: Diagnosis not present

## 2021-03-07 DIAGNOSIS — Z0001 Encounter for general adult medical examination with abnormal findings: Secondary | ICD-10-CM | POA: Diagnosis not present

## 2021-03-07 DIAGNOSIS — R062 Wheezing: Secondary | ICD-10-CM | POA: Diagnosis not present

## 2021-03-07 DIAGNOSIS — J209 Acute bronchitis, unspecified: Secondary | ICD-10-CM | POA: Diagnosis not present

## 2021-03-07 DIAGNOSIS — F17208 Nicotine dependence, unspecified, with other nicotine-induced disorders: Secondary | ICD-10-CM | POA: Diagnosis not present

## 2021-03-07 DIAGNOSIS — F17210 Nicotine dependence, cigarettes, uncomplicated: Secondary | ICD-10-CM | POA: Diagnosis not present

## 2021-03-08 DIAGNOSIS — J410 Simple chronic bronchitis: Secondary | ICD-10-CM | POA: Diagnosis not present

## 2021-05-10 ENCOUNTER — Emergency Department: Payer: BC Managed Care – PPO

## 2021-05-10 ENCOUNTER — Other Ambulatory Visit: Payer: Self-pay

## 2021-05-10 ENCOUNTER — Emergency Department
Admission: EM | Admit: 2021-05-10 | Discharge: 2021-05-10 | Disposition: A | Discharge status: Home / Self Care | Payer: BC Managed Care – PPO | Attending: Emergency Medicine | Admitting: Emergency Medicine

## 2021-05-10 DIAGNOSIS — R0682 Tachypnea, not elsewhere classified: Secondary | ICD-10-CM | POA: Diagnosis not present

## 2021-05-10 DIAGNOSIS — J441 Chronic obstructive pulmonary disease with (acute) exacerbation: Secondary | ICD-10-CM | POA: Insufficient documentation

## 2021-05-10 DIAGNOSIS — R0602 Shortness of breath: Secondary | ICD-10-CM | POA: Diagnosis not present

## 2021-05-10 DIAGNOSIS — F17210 Nicotine dependence, cigarettes, uncomplicated: Secondary | ICD-10-CM | POA: Diagnosis not present

## 2021-05-10 DIAGNOSIS — M549 Dorsalgia, unspecified: Secondary | ICD-10-CM | POA: Diagnosis not present

## 2021-05-10 DIAGNOSIS — R519 Headache, unspecified: Secondary | ICD-10-CM | POA: Diagnosis not present

## 2021-05-10 DIAGNOSIS — R059 Cough, unspecified: Secondary | ICD-10-CM | POA: Diagnosis not present

## 2021-05-10 LAB — COMPREHENSIVE METABOLIC PANEL
ALT: 47 U/L — ABNORMAL HIGH (ref 0–44)
AST: 29 U/L (ref 15–41)
Albumin: 4.4 g/dL (ref 3.5–5.0)
Alkaline Phosphatase: 87 U/L (ref 38–126)
Anion gap: 9 (ref 5–15)
BUN: 15 mg/dL (ref 6–20)
CO2: 24 mmol/L (ref 22–32)
Calcium: 8.9 mg/dL (ref 8.9–10.3)
Chloride: 107 mmol/L (ref 98–111)
Creatinine, Ser: 0.76 mg/dL (ref 0.61–1.24)
GFR, Estimated: >60 mL/min (ref >60)
Glucose, Bld: 131 mg/dL — ABNORMAL HIGH (ref 70–99)
Potassium: 3.8 mmol/L (ref 3.5–5.1)
Sodium: 140 mmol/L (ref 135–145)
Total Bilirubin: 0.7 mg/dL (ref 0.3–1.2)
Total Protein: 7.2 g/dL (ref 6.5–8.1)

## 2021-05-10 LAB — CBC WITH DIFFERENTIAL/PLATELET
Abs Immature Granulocytes: 0.03 10*3/uL (ref 0.00–0.07)
Basophils Absolute: 0.1 10*3/uL (ref 0.0–0.1)
Basophils Relative: 1 %
Eosinophils Absolute: 0.6 10*3/uL — ABNORMAL HIGH (ref 0.0–0.5)
Eosinophils Relative: 6 %
HCT: 40.3 % (ref 39.0–52.0)
Hemoglobin: 14.2 g/dL (ref 13.0–17.0)
Immature Granulocytes: 0 %
Lymphocytes Relative: 28 %
Lymphs Abs: 2.7 10*3/uL (ref 0.7–4.0)
MCH: 30.2 pg (ref 26.0–34.0)
MCHC: 35.2 g/dL (ref 30.0–36.0)
MCV: 85.7 fL (ref 80.0–100.0)
Monocytes Absolute: 0.8 10*3/uL (ref 0.1–1.0)
Monocytes Relative: 8 %
Neutro Abs: 5.4 10*3/uL (ref 1.7–7.7)
Neutrophils Relative %: 57 %
Platelets: 262 10*3/uL (ref 150–400)
RBC: 4.7 MIL/uL (ref 4.22–5.81)
RDW: 12.8 % (ref 11.5–15.5)
WBC: 9.5 10*3/uL (ref 4.0–10.5)
nRBC: 0 % (ref 0.0–0.2)

## 2021-05-10 LAB — PROCALCITONIN: Procalcitonin: <0.1 ng/mL

## 2021-05-10 LAB — TROPONIN I (HIGH SENSITIVITY): Troponin I (High Sensitivity): 3 ng/L (ref <18)

## 2021-05-10 MED ORDER — IPRATROPIUM-ALBUTEROL 0.5-2.5 (3) MG/3ML IN SOLN
9.0000 mL | Freq: Once | RESPIRATORY_TRACT | Status: AC
  Administered 2021-05-10: 9 mL via RESPIRATORY_TRACT
  Filled 2021-05-10: qty 9

## 2021-05-10 MED ORDER — METHYLPREDNISOLONE SODIUM SUCC 125 MG IJ SOLR
125.0000 mg | Freq: Once | INTRAMUSCULAR | Status: AC
  Administered 2021-05-10: 125 mg via INTRAVENOUS
  Filled 2021-05-10: qty 2

## 2021-05-10 MED ORDER — PREDNISONE 10 MG PO TABS: 40.0000 mg | ORAL_TABLET | Freq: Every day | ORAL | 0 refills | Status: AC

## 2021-05-10 NOTE — ED Triage Notes (Signed)
Pt in with co cough and shob for 2-3 weeks states has hx of copd. Has been using inhalers at home without relief. Pt states gets bronchitis frequently, feels the same.

## 2021-05-10 NOTE — ED Provider Notes (Signed)
[facility] Emergency Department Provider Note  ____________________________________________   Event Date/Time   First MD Initiated Contact with Patient 05/10/21 2114     (approximate)  I have reviewed the triage vital signs and the nursing notes.   HISTORY  Chief Complaint Shortness of Breath   HPI John [name] is a 45 y.o. adult with a past medical history of anxiety, COPD multiple episodes of prior bronchitis who presents for assessment approximately 2 to 3 weeks of cough and shortness of breath.  States he has been using his home inhalers without any relief.  States this feels very similar to prior bronchitis episodes into the past.  States he feels like he is getting better despite home treatments.  He states he sometimes has a headache back pain when he is coughing a lot but none otherwise.  Denies any chest pain, Donnell pain, vomiting, diarrhea, dysuria, rash or any other associate symptoms.  Denies any fevers.  States he is a prior drug addict and has not use any illegal drugs in many years or using alcohol but still smokes about 2 packs/day trying to cut down on this.  Denies any other acute concerns at this time.  States he does not be tested for COVID at this time.         Past Medical History:  Diagnosis Date  . Anxiety   . Bronchitis   . Depression   . Opiate addiction (HCC)   . Panic attacks   . Pneumonia   . Testicular torsion     Patient Active Problem List   Diagnosis Date Noted  . CAP (community acquired pneumonia) 04/10/2016    Past Surgical History:  Procedure Laterality Date  . APPENDECTOMY    . EYE SURGERY    . NASAL SINUS SURGERY    . VASECTOMY      Prior to Admission medications   Medication Sig Start Date End Date Taking? Authorizing Provider  predniSONE (DELTASONE) 10 MG tablet Take 4 tablets (40 mg total) by mouth daily for 5 days. 05/10/21 05/15/21 Yes Gilles Chiquito, MD  albuterol (VENTOLIN HFA) 108 (90 Base)  MCG/ACT inhaler Inhale 2 puffs into the lungs every 6 (six) hours as needed for wheezing or shortness of breath. 06/13/19   Minna Antis, MD  azithromycin (ZITHROMAX Z-PAK) 250 MG tablet 2 pills today then 1 pill a day for 4 days 10/05/20   Sherrie Mustache Roselyn Bering, PA-C  Buprenorphine HCl-Naloxone HCl (SUBOXONE) 8-2 MG FILM Place 1 Film under the tongue daily.     [provider]  gabapentin (NEURONTIN) 300 MG capsule Take 300 mg by mouth 4 (four) times daily.    [provider]    Allergies No known allergies and Other  Family History  Problem Relation Age of Onset  . Diabetes Mother   . Hypertension Father   . Cancer Other     Social History Social History   Tobacco Use  . Smoking status: Current Every Day Smoker    Packs/day: 2.00    Types: Cigarettes  . Smokeless tobacco: Never Used  Vaping Use  . Vaping Use: Never used  Substance Use Topics  . Alcohol use: No  . Drug use: Not Currently    Types: Marijuana    Comment: takes suboxin    Review of Systems  Review of Systems  Constitutional: Positive for malaise/fatigue. Negative for chills and fever.  HENT: Negative for sore throat.   Eyes: Negative for pain.  Respiratory: Positive  for cough and shortness of breath. Negative for stridor.   Cardiovascular: Negative for chest pain.  Gastrointestinal: Negative for vomiting.  Genitourinary: Negative for dysuria.  Musculoskeletal: Positive for back pain ( when coughing on and off over last several weeks non now).  Skin: Negative for rash.  Neurological: Positive for headaches ( intermittently over last several weeks when coughing lots, not now in ED). Negative for seizures and loss of consciousness.  Psychiatric/Behavioral: Negative for suicidal ideas.  All other systems reviewed and are negative.     ____________________________________________   PHYSICAL EXAM:  VITAL SIGNS: ED Triage Vitals [05/10/21 2114]  Enc Vitals Group     BP 132/62      Pulse Rate 79     Resp 20     Temp 98.4 F (36.9 C)     Temp Source Oral     SpO2 95 %     Weight 230 lb (104.3 kg)     Height 5\' 10"  (1.778 m)     Head Circumference      Peak Flow      Pain Score 0     Pain Loc      Pain Edu?      Excl. in GC?    Vitals:   05/10/21 2114  BP: 132/62  Pulse: 79  Resp: 20  Temp: 98.4 F (36.9 C)  SpO2: 95%   Physical Exam Vitals and nursing note reviewed.  Constitutional:      Appearance: He is well-developed.  HENT:     Head: Normocephalic and atraumatic.  Eyes:     Conjunctiva/sclera: Conjunctivae normal.  Cardiovascular:     Rate and Rhythm: Normal rate and regular rhythm.     Heart sounds: No murmur heard.   Pulmonary:     Effort: Tachypnea present.     Breath sounds: Wheezing present.  Abdominal:     Palpations: Abdomen is soft.     Tenderness: There is no abdominal tenderness.  Musculoskeletal:     Cervical back: Neck supple.  Skin:    General: Skin is warm and dry.     Capillary Refill: Capillary refill takes less than 2 seconds.  Neurological:     General: No focal deficit present.     Mental Status: He is alert and oriented to person, place, and time.  Psychiatric:        Mood and Affect: Mood normal.      ____________________________________________   LABS (all labs ordered are listed, but only abnormal results are displayed)  Labs Reviewed  CBC WITH DIFFERENTIAL/PLATELET - Abnormal; Notable for the following components:      Result Value   Eosinophils Absolute 0.6 (*)    All other components within normal limits  COMPREHENSIVE METABOLIC PANEL - Abnormal; Notable for the following components:   Glucose, Bld 131 (*)    ALT 47 (*)    All other components within normal limits  PROCALCITONIN  TROPONIN I (HIGH SENSITIVITY)   ____________________________________________  EKG  Sinus rhythm with ventricular rate of 81, normal axis, unremarkable intervals with some artifact in V1 and lead III without clear  evidence of acute ischemia or other significant underlying arrhythmia. ____________________________________________  RADIOLOGY  ED MD interpretation: No focal consolidation, effusion, significant edema, pneumothorax or other clear acute intrathoracic process.  Official radiology report(s): DG Chest 2 View  Result Date: 05/10/2021 CLINICAL DATA:  Cough and shortness of breath for several weeks EXAM: CHEST - 2 VIEW COMPARISON:  10/05/2020 FINDINGS: The heart size  and mediastinal contours are within normal limits. Both lungs are clear. The visualized skeletal structures are unremarkable. IMPRESSION: No active cardiopulmonary disease. Electronically Signed   By: Alcide Clever M.D.   On: 05/10/2021 21:40    ____________________________________________   PROCEDURES  Procedure(s) performed (including Critical Care):  .1-3 Lead EKG Interpretation Performed by: Gilles Chiquito, MD Authorized by: Gilles Chiquito, MD     Interpretation: normal     ECG rate assessment: normal     Rhythm: sinus rhythm     Ectopy: none     Conduction: normal       ____________________________________________   INITIAL IMPRESSION / ASSESSMENT AND PLAN / ED COURSE      Patient presents with above-stated history exam for assessment of cough and shortness of breath worsening over the last couple weeks.  This is not responding to his usual home albuterol.  On arrival he is afebrile and SPO2 of 95% on room air with a respiratory of 20 with otherwise stable vital signs.  He does have bilateral wheezing on exam and some tachypnea.  Suspect likely acute COPD exacerbation.  Differential also includes possible spontaneous pneumothorax, metabolic derangements, anemia, acute bacterial ammonia.  He is PERC negative and Evalose patient for PE. Given reassuring EKG and nonelevated troponin obtained greater than 3 hours after symptom onset I have a low suspicion for ACS.  Chest x-ray has no evidence of pneumothorax,  focal consolidation, edema effusion or any other clear acute intrathoracic process.  Given absence of fever or leukocytosis Evalose patient for acute bacterial infection at this time.  Patient declined COVID or flu testing.  Treated with duo nebs and steroids.  On my reassessment his wheezing was improved and stated he felt better.  Given absence of hypoxia with otherwise reassuring exam and work-up patient stating he felt much better with resolution of wheezing I think he is safe for discharge with outpatient follow-up.  Short course of prednisone written.  Discharged stable condition.  Strict return precautions advised and discussed.    ____________________________________________   FINAL CLINICAL IMPRESSION(S) / ED DIAGNOSES  Final diagnoses:  COPD exacerbation (HCC)    Medications  ipratropium-albuterol (DUONEB) 0.5-2.5 (3) MG/3ML nebulizer solution 9 mL (9 mLs Nebulization Given 05/10/21 2137)  methylPREDNISolone sodium succinate (SOLU-MEDROL) 125 mg/2 mL injection 125 mg (125 mg Intravenous Given 05/10/21 2142)     ED Discharge Orders         Ordered    predniSONE (DELTASONE) 10 MG tablet  Daily        05/10/21 2221           Note:  This document was prepared using Dragon voice recognition software and may include unintentional dictation errors.   Gilles Chiquito, MD 05/10/21 2238

## 2021-06-04 ENCOUNTER — Emergency Department: Payer: BC Managed Care – PPO

## 2021-06-04 ENCOUNTER — Other Ambulatory Visit: Payer: Self-pay

## 2021-06-04 ENCOUNTER — Emergency Department
Admission: EM | Admit: 2021-06-04 | Discharge: 2021-06-04 | Disposition: A | Discharge status: Home / Self Care | Payer: BC Managed Care – PPO | Attending: Emergency Medicine | Admitting: Emergency Medicine

## 2021-06-04 DIAGNOSIS — R1084 Generalized abdominal pain: Secondary | ICD-10-CM | POA: Diagnosis not present

## 2021-06-04 DIAGNOSIS — I10 Essential (primary) hypertension: Secondary | ICD-10-CM | POA: Diagnosis not present

## 2021-06-04 DIAGNOSIS — R002 Palpitations: Secondary | ICD-10-CM | POA: Diagnosis not present

## 2021-06-04 DIAGNOSIS — F17210 Nicotine dependence, cigarettes, uncomplicated: Secondary | ICD-10-CM | POA: Insufficient documentation

## 2021-06-04 DIAGNOSIS — R079 Chest pain, unspecified: Secondary | ICD-10-CM | POA: Diagnosis not present

## 2021-06-04 LAB — CBC
HCT: 40.3 % (ref 39.0–52.0)
Hemoglobin: 13.9 g/dL (ref 13.0–17.0)
MCH: 31 pg (ref 26.0–34.0)
MCHC: 34.5 g/dL (ref 30.0–36.0)
MCV: 90 fL (ref 80.0–100.0)
Platelets: 230 10*3/uL (ref 150–400)
RBC: 4.48 MIL/uL (ref 4.22–5.81)
RDW: 12.9 % (ref 11.5–15.5)
WBC: 6.7 10*3/uL (ref 4.0–10.5)
nRBC: 0 % (ref 0.0–0.2)

## 2021-06-04 LAB — BASIC METABOLIC PANEL
Anion gap: 7 (ref 5–15)
BUN: 16 mg/dL (ref 6–20)
CO2: 25 mmol/L (ref 22–32)
Calcium: 8.7 mg/dL — ABNORMAL LOW (ref 8.9–10.3)
Chloride: 108 mmol/L (ref 98–111)
Creatinine, Ser: 0.79 mg/dL (ref 0.61–1.24)
GFR, Estimated: >60 mL/min (ref >60)
Glucose, Bld: 167 mg/dL — ABNORMAL HIGH (ref 70–99)
Potassium: 3.8 mmol/L (ref 3.5–5.1)
Sodium: 140 mmol/L (ref 135–145)

## 2021-06-04 LAB — TROPONIN I (HIGH SENSITIVITY)
Troponin I (High Sensitivity): 5 ng/L (ref <18)
Troponin I (High Sensitivity): 6 ng/L (ref <18)

## 2021-06-04 LAB — T4, FREE: Free T4: 0.72 ng/dL (ref 0.61–1.12)

## 2021-06-04 LAB — MAGNESIUM: Magnesium: 2.1 mg/dL (ref 1.7–2.4)

## 2021-06-04 LAB — TSH: TSH: 7.131 u[IU]/mL — ABNORMAL HIGH (ref 0.350–4.500)

## 2021-06-04 NOTE — ED Triage Notes (Signed)
FIRST NURSE NOTE: Pt arrived via [facility], pt woke up at 3am to use the bathroom, reports a "funny feeling" in his chest, hx of the same, told he has anxiety.  No cardiac hx. On suboxone. 2PPD smoker. No pain.

## 2021-06-04 NOTE — ED Triage Notes (Signed)
Pt states he woke up around 2am and began to experience heart racing and sensation of chest discomfort with a "funny feeling" in his tongue. Pt states he felt like he had gas. Pt states feeling lasted approx one hour. Pt denies shob.

## 2021-06-04 NOTE — ED Notes (Signed)
E-signature not working at this time. Pt verbalized understanding of D/C instructions, prescriptions and follow up care with no further questions at this time. Pt in NAD and ambulatory at time of D/C.  

## 2021-06-04 NOTE — ED Provider Notes (Signed)
[facility] Emergency Department Provider Note  ____________________________________________  Time seen: Approximately 5:52 AM  I have reviewed the triage vital signs and the nursing notes.   HISTORY  Chief Complaint Palpitations   HPI John [name] is a 45 y.o. male with a history of anxiety, depression, opiate addiction in remission on Suboxone for several years who presents for evaluation of palpitations.  Patient reports that he was in usual state of health and went to sleep last night.  He woke up at 3 AM to use the bathroom.  When he returned and laying in bed he started feeling his heart pounding in his chest.  He reports that he had a heaviness sensation in the center of his chest associated with an and then a warmth sensation that took over his body.   He reports that the episode lasted about 1.5 hours but he has now resolved.  He reports having this episode 2 other times in the past.  In one of the times he came to the emergency room and he was told it was anxiety.  Patient denies feeling anxious, denies hyperventilating, denies shortness of breath, denies dizziness or syncope.  He denies personal or family history of blood clots, recent travel immobilization, leg pain or swelling, hemoptysis or exogenous hormones.  He denies drugs or alcohol use.  Past Medical History:  Diagnosis Date  . Anxiety   . Bronchitis   . Depression   . Opiate addiction (HCC)   . Panic attacks   . Pneumonia   . Testicular torsion     Patient Active Problem List   Diagnosis Date Noted  . CAP (community acquired pneumonia) 04/10/2016    Past Surgical History:  Procedure Laterality Date  . APPENDECTOMY    . EYE SURGERY    . NASAL SINUS SURGERY    . VASECTOMY      Prior to Admission medications   Medication Sig Start Date End Date Taking? Authorizing Provider  albuterol (VENTOLIN HFA) 108 (90 Base) MCG/ACT inhaler Inhale 2 puffs into the lungs every 6 (six) hours  as needed for wheezing or shortness of breath. 06/13/19   Minna Antis, MD  azithromycin (ZITHROMAX Z-PAK) 250 MG tablet 2 pills today then 1 pill a day for 4 days 10/05/20   Sherrie Mustache Roselyn Bering, PA-C  Buprenorphine HCl-Naloxone HCl (SUBOXONE) 8-2 MG FILM Place 1 Film under the tongue daily.     [provider]  gabapentin (NEURONTIN) 300 MG capsule Take 300 mg by mouth 4 (four) times daily.    [provider]    Allergies No known allergies and Other  Family History  Problem Relation Age of Onset  . Diabetes Mother   . Hypertension Father   . Cancer Other     Social History Social History   Tobacco Use  . Smoking status: Current Every Day Smoker    Packs/day: 2.00    Types: Cigarettes  . Smokeless tobacco: Never Used  Vaping Use  . Vaping Use: Never used  Substance Use Topics  . Alcohol use: No  . Drug use: Not Currently    Types: Marijuana    Comment: takes suboxin    Review of Systems  Constitutional: Negative for fever. Eyes: Negative for visual changes. ENT: Negative for sore throat. Neck: No neck pain  Cardiovascular: + chest pain and palpitations Respiratory: Negative for shortness of breath. Gastrointestinal: Negative for abdominal pain, vomiting or diarrhea. Genitourinary: Negative for dysuria. Musculoskeletal: Negative for back pain. Skin:  Negative for rash. Neurological: Negative for headaches, weakness or numbness. Psych: No SI or HI  ____________________________________________   PHYSICAL EXAM:  VITAL SIGNS: ED Triage Vitals [06/04/21 0415]  Enc Vitals Group     BP (!) 145/84     Pulse Rate 74     Resp 18     Temp 98.4 F (36.9 C)     Temp Source Oral     SpO2 98 %     Weight 215 lb (97.5 kg)     Height 5\' 10"  (1.778 m)     Head Circumference      Peak Flow      Pain Score 2     Pain Loc      Pain Edu?      Excl. in GC?     Constitutional: Alert and oriented. Well appearing and in no apparent distress. HEENT:       Head: Normocephalic and atraumatic.         Eyes: Conjunctivae are normal. Sclera is non-icteric.       Mouth/Throat: Mucous membranes are moist.       Neck: Supple with no signs of meningismus. Cardiovascular: Regular rate and rhythm. No murmurs, gallops, or rubs. 2+ symmetrical distal pulses are present in all extremities. No JVD. Respiratory: Normal respiratory effort. Lungs are clear to auscultation bilaterally.  Gastrointestinal: Soft, non tender, and non distended. Musculoskeletal:  No edema, cyanosis, or erythema of extremities. Neurologic: Normal speech and language. Face is symmetric. Moving all extremities. No gross focal neurologic deficits are appreciated. Skin: Skin is warm, dry and intact. No rash noted. Psychiatric: Mood and affect are normal. Speech and behavior are normal.  ____________________________________________   LABS (all labs ordered are listed, but only abnormal results are displayed)  Labs Reviewed  BASIC METABOLIC PANEL - Abnormal; Notable for the following components:      Result Value   Glucose, Bld 167 (*)    Calcium 8.7 (*)    All other components within normal limits  TSH - Abnormal; Notable for the following components:   TSH 7.131 (*)    All other components within normal limits  CBC  MAGNESIUM  T4, FREE  TROPONIN I (HIGH SENSITIVITY)  TROPONIN I (HIGH SENSITIVITY)   ____________________________________________  EKG  ED ECG REPORT I, , the attending physician, personally viewed and interpreted this ECG.  Sinus rhythm with first-degree AV block, rate of 74, normal QTC, normal axis, no ST elevations or depressions.  Otherwise normal EKG ____________________________________________  RADIOLOGY  I have personally reviewed the images performed during this visit and I agree with the Radiologist's read.   Interpretation by Radiologist:  DG Chest 2 View  Result Date: 06/04/2021 CLINICAL DATA:  Chest pain EXAM: CHEST - 2  VIEW COMPARISON:  Chest x-ray 05/11/2019 FINDINGS: The heart size and mediastinal contours are within normal limits. No focal consolidation. No pulmonary edema. No pleural effusion. No pneumothorax. No acute osseous abnormality. IMPRESSION: No active cardiopulmonary disease. Electronically Signed   By: 07/11/2019 M.D.   On: 06/04/2021 05:14    ____________________________________________   PROCEDURES  Procedure(s) performed:yes .1-3 Lead EKG Interpretation Performed by: 08/04/2021, MD Authorized by: Nita Sickle, MD     Interpretation: normal     ECG rate assessment: normal     Rhythm: sinus rhythm     Ectopy: none     Conduction: normal     Critical Care performed:  None ____________________________________________   INITIAL IMPRESSION / ASSESSMENT AND  PLAN / ED COURSE  45 y.o. male with a history of anxiety, depression, opiate addiction in remission on Suboxone for several years who presents for evaluation of palpitation and chest heaviness that lasted 1.5hrs and resolved spontaneously. Patient with 2 similar episodes in the past.  Denies any anxiety and reports that the episode was unprovoked.  Here he is extremely well-appearing in no distress with normal vital signs, strong pulses all 4 extremities, heart regular rate and rhythm with no murmurs, lungs are clear, looks euvolemic with no asymmetric swelling of his extremities.  Differential diagnoses include dysrhythmia such as A. fib, a flutter, V. tach, PVCs/PACs versus anxiety versus panic attack versus GERD/indigestion.  Sounds atypical for ACS especially with an EKG that is nonischemic and 2 high-sensitivity troponins that were negative.  Chest x-ray does not show any evidence of pneumonia, edema or pneumothorax.  Low suspicion for PE and patient is PERC negative.  Labs showing mild hyperglycemia which patient has had for a while.  Discussed the finding with him and recommended fasting hemoglobin A1c with his  primary care doctor.  Thyroid studies showed mildly elevated TSH but normal free T4.  No electrolyte derangements.  We will monitor patient on telemetry for an hour to rule out any dysrhythmias and if that is negative will refer to cardiology for Holter monitoring.  Old medical records reviewed.  Patient placed on telemetry.  _________________________ 6:36 AM on 06/04/2021 -----------------------------------------  Repeat troponin remains negative.  Patient monitor on telemetry with no signs of dysrhythmias.  Patient is being referred to cardiology for Holter monitoring.  Discussed my standard return precautions and follow-up.    _____________________________________________ Please note:  Patient was evaluated in Emergency Department today for the symptoms described in the history of present illness. Patient was evaluated in the context of the global COVID-19 pandemic, which necessitated consideration that the patient might be at risk for infection with the SARS-CoV-2 virus that causes COVID-19. Institutional protocols and algorithms that pertain to the evaluation of patients at risk for COVID-19 are in a state of rapid change based on information released by regulatory bodies including the CDC and federal and state organizations. These policies and algorithms were followed during the patient's care in the ED.  Some ED evaluations and interventions may be delayed as a result of limited staffing during the pandemic.   [place] Controlled Substance Database was reviewed by me. ____________________________________________   FINAL CLINICAL IMPRESSION(S) / ED DIAGNOSES   Final diagnoses:  Palpitations      NEW MEDICATIONS STARTED DURING THIS VISIT:  ED Discharge Orders         Ordered    Ambulatory referral to Cardiology        06/04/21 0635           Note:  This document was prepared using Dragon voice recognition software and may include unintentional dictation errors.    Don Perking,  [place], MD 06/04/21 [phone]

## 2021-06-04 NOTE — ED Notes (Signed)
Pt states is starting to have increased "funny feeling" in his chest, repeat ekg in progress.

## 2021-06-07 ENCOUNTER — Ambulatory Visit (HOSPITAL_COMMUNITY): Payer: Self-pay | Admitting: Medical

## 2021-06-07 ENCOUNTER — Other Ambulatory Visit: Payer: Self-pay

## 2021-06-16 ENCOUNTER — Emergency Department (HOSPITAL_COMMUNITY)
Admission: EM | Admit: 2021-06-16 | Discharge: 2021-06-16 | Disposition: A | Discharge status: Home / Self Care | Payer: BC Managed Care – PPO | Attending: Emergency Medicine | Admitting: Emergency Medicine

## 2021-06-16 ENCOUNTER — Other Ambulatory Visit: Payer: Self-pay

## 2021-06-16 ENCOUNTER — Emergency Department (HOSPITAL_COMMUNITY): Payer: BC Managed Care – PPO

## 2021-06-16 ENCOUNTER — Encounter (HOSPITAL_COMMUNITY): Payer: Self-pay | Admitting: Emergency Medicine

## 2021-06-16 DIAGNOSIS — M79671 Pain in right foot: Secondary | ICD-10-CM | POA: Insufficient documentation

## 2021-06-16 DIAGNOSIS — M25512 Pain in left shoulder: Secondary | ICD-10-CM | POA: Insufficient documentation

## 2021-06-16 DIAGNOSIS — Y92410 Unspecified street and highway as the place of occurrence of the external cause: Secondary | ICD-10-CM | POA: Diagnosis not present

## 2021-06-16 DIAGNOSIS — F17210 Nicotine dependence, cigarettes, uncomplicated: Secondary | ICD-10-CM | POA: Diagnosis not present

## 2021-06-16 DIAGNOSIS — M25511 Pain in right shoulder: Secondary | ICD-10-CM | POA: Diagnosis not present

## 2021-06-16 DIAGNOSIS — Y9355 Activity, bike riding: Secondary | ICD-10-CM | POA: Insufficient documentation

## 2021-06-16 MED ORDER — NAPROXEN 500 MG PO TABS: 500.0000 mg | ORAL_TABLET | Freq: Two times a day (BID) | ORAL | 0 refills | Status: AC

## 2021-06-16 MED ORDER — METHOCARBAMOL 500 MG PO TABS: 500.0000 mg | ORAL_TABLET | Freq: Two times a day (BID) | ORAL | 0 refills | Status: DC

## 2021-06-16 NOTE — Discharge Instructions (Addendum)
Your xrays did not show any traumatic injuries or fractures. Your pain is likely due to muscle pain.   You may alternate taking Tylenol and Naproxen as needed for pain control. You may take Naproxen twice daily as directed on your discharge paperwork and you may take  [phone] mg of Tylenol every 6 hours. Do not exceed 4000 mg of Tylenol daily as this can lead to liver damage. Also, make sure to take Naproxen with meals as it can cause an upset stomach. Do not take other NSAIDs while taking Naproxen such as (Aleve, Ibuprofen, Aspirin, Celebrex, etc) and do not take more than the prescribed dose as this can lead to ulcers and bleeding in your GI tract. You may use warm and cold compresses to help with your symptoms.   You were given a prescription for Robaxin which is a muscle relaxer.  You should not drive, work, or operate machinery while taking this medication as it can make you very drowsy.    Please follow up with your primary doctor within the next 7-10 days for re-evaluation and further treatment of your symptoms.   Please return to the ER sooner if you have any new or worsening symptoms.
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
## 2021-06-22 ENCOUNTER — Encounter (HOSPITAL_COMMUNITY): Payer: Self-pay | Admitting: Medical

## 2021-06-22 ENCOUNTER — Telehealth (INDEPENDENT_AMBULATORY_CARE_PROVIDER_SITE_OTHER): Payer: BC Managed Care – PPO | Admitting: Medical

## 2021-06-22 ENCOUNTER — Ambulatory Visit: Payer: BC Managed Care – PPO | Admitting: Cardiology

## 2021-06-22 ENCOUNTER — Other Ambulatory Visit: Payer: Self-pay

## 2021-06-22 DIAGNOSIS — F1120 Opioid dependence, uncomplicated: Secondary | ICD-10-CM | POA: Diagnosis not present

## 2021-06-22 DIAGNOSIS — Z79899 Other long term (current) drug therapy: Secondary | ICD-10-CM

## 2021-06-22 DIAGNOSIS — F1920 Other psychoactive substance dependence, uncomplicated: Secondary | ICD-10-CM

## 2021-06-22 DIAGNOSIS — Z5181 Encounter for therapeutic drug level monitoring: Secondary | ICD-10-CM | POA: Diagnosis not present

## 2021-06-22 DIAGNOSIS — F1193 Opioid use, unspecified with withdrawal: Secondary | ICD-10-CM | POA: Diagnosis not present

## 2021-06-22 DIAGNOSIS — F4312 Post-traumatic stress disorder, chronic: Secondary | ICD-10-CM

## 2021-06-22 MED ORDER — BUPRENORPHINE HCL-NALOXONE HCL 12-3 MG SL FILM: 1.0000 | ORAL_FILM | Freq: Three times a day (TID) | SUBLINGUAL | 0 refills | Status: DC

## 2021-06-22 MED ORDER — BUPRENORPHINE HCL-NALOXONE HCL 12-3 MG SL FILM: 1.0000 | ORAL_FILM | Freq: Two times a day (BID) | SUBLINGUAL | 0 refills | Status: AC
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
## 2021-06-25 ENCOUNTER — Encounter: Payer: Self-pay | Admitting: Cardiology

## 2021-06-28 DIAGNOSIS — F1120 Opioid dependence, uncomplicated: Secondary | ICD-10-CM | POA: Diagnosis not present

## 2021-07-03 DIAGNOSIS — F1120 Opioid dependence, uncomplicated: Secondary | ICD-10-CM | POA: Diagnosis not present

## 2021-07-05 DIAGNOSIS — F1120 Opioid dependence, uncomplicated: Secondary | ICD-10-CM | POA: Diagnosis not present

## 2021-07-31 DIAGNOSIS — F1120 Opioid dependence, uncomplicated: Secondary | ICD-10-CM | POA: Diagnosis not present

## 2021-07-31 DIAGNOSIS — Z7151 Drug abuse counseling and surveillance of drug abuser: Secondary | ICD-10-CM | POA: Diagnosis not present

## 2021-08-01 DIAGNOSIS — F1120 Opioid dependence, uncomplicated: Secondary | ICD-10-CM | POA: Diagnosis not present

## 2021-08-03 ENCOUNTER — Ambulatory Visit: Payer: BC Managed Care – PPO | Admitting: Cardiology

## 2021-08-06 ENCOUNTER — Encounter: Payer: Self-pay | Admitting: Cardiology

## 2021-08-06 DIAGNOSIS — Z7151 Drug abuse counseling and surveillance of drug abuser: Secondary | ICD-10-CM | POA: Diagnosis not present

## 2021-08-06 DIAGNOSIS — F1120 Opioid dependence, uncomplicated: Secondary | ICD-10-CM | POA: Diagnosis not present

## 2021-08-08 DIAGNOSIS — F1120 Opioid dependence, uncomplicated: Secondary | ICD-10-CM | POA: Diagnosis not present

## 2021-08-08 DIAGNOSIS — Z7151 Drug abuse counseling and surveillance of drug abuser: Secondary | ICD-10-CM | POA: Diagnosis not present

## 2021-08-09 DIAGNOSIS — Z03818 Encounter for observation for suspected exposure to other biological agents ruled out: Secondary | ICD-10-CM | POA: Diagnosis not present

## 2021-08-09 DIAGNOSIS — F1120 Opioid dependence, uncomplicated: Secondary | ICD-10-CM | POA: Diagnosis not present

## 2021-08-15 DIAGNOSIS — Z7151 Drug abuse counseling and surveillance of drug abuser: Secondary | ICD-10-CM | POA: Diagnosis not present

## 2021-08-15 DIAGNOSIS — F1120 Opioid dependence, uncomplicated: Secondary | ICD-10-CM | POA: Diagnosis not present

## 2021-08-16 DIAGNOSIS — F1120 Opioid dependence, uncomplicated: Secondary | ICD-10-CM | POA: Diagnosis not present

## 2021-08-20 DIAGNOSIS — Z7151 Drug abuse counseling and surveillance of drug abuser: Secondary | ICD-10-CM | POA: Diagnosis not present

## 2021-08-20 DIAGNOSIS — F1120 Opioid dependence, uncomplicated: Secondary | ICD-10-CM | POA: Diagnosis not present

## 2021-08-22 DIAGNOSIS — Z7151 Drug abuse counseling and surveillance of drug abuser: Secondary | ICD-10-CM | POA: Diagnosis not present

## 2021-08-22 DIAGNOSIS — F1120 Opioid dependence, uncomplicated: Secondary | ICD-10-CM | POA: Diagnosis not present

## 2021-08-27 DIAGNOSIS — Z7141 Alcohol abuse counseling and surveillance of alcoholic: Secondary | ICD-10-CM | POA: Diagnosis not present

## 2021-08-27 DIAGNOSIS — F1120 Opioid dependence, uncomplicated: Secondary | ICD-10-CM | POA: Diagnosis not present

## 2021-08-29 DIAGNOSIS — F1120 Opioid dependence, uncomplicated: Secondary | ICD-10-CM | POA: Diagnosis not present

## 2021-08-29 DIAGNOSIS — Z7151 Drug abuse counseling and surveillance of drug abuser: Secondary | ICD-10-CM | POA: Diagnosis not present

## 2021-08-30 DIAGNOSIS — Z03818 Encounter for observation for suspected exposure to other biological agents ruled out: Secondary | ICD-10-CM | POA: Diagnosis not present

## 2021-08-30 DIAGNOSIS — F1120 Opioid dependence, uncomplicated: Secondary | ICD-10-CM | POA: Diagnosis not present

## 2021-09-05 DIAGNOSIS — F1120 Opioid dependence, uncomplicated: Secondary | ICD-10-CM | POA: Diagnosis not present

## 2021-09-05 DIAGNOSIS — Z7151 Drug abuse counseling and surveillance of drug abuser: Secondary | ICD-10-CM | POA: Diagnosis not present

## 2021-09-10 DIAGNOSIS — Z7151 Drug abuse counseling and surveillance of drug abuser: Secondary | ICD-10-CM | POA: Diagnosis not present

## 2021-09-10 DIAGNOSIS — F1120 Opioid dependence, uncomplicated: Secondary | ICD-10-CM | POA: Diagnosis not present

## 2021-09-12 DIAGNOSIS — F1120 Opioid dependence, uncomplicated: Secondary | ICD-10-CM | POA: Diagnosis not present

## 2021-09-17 DIAGNOSIS — F1120 Opioid dependence, uncomplicated: Secondary | ICD-10-CM | POA: Diagnosis not present

## 2021-09-18 DIAGNOSIS — F1120 Opioid dependence, uncomplicated: Secondary | ICD-10-CM | POA: Diagnosis not present

## 2021-09-18 DIAGNOSIS — Z7151 Drug abuse counseling and surveillance of drug abuser: Secondary | ICD-10-CM | POA: Diagnosis not present

## 2021-09-19 DIAGNOSIS — Z7151 Drug abuse counseling and surveillance of drug abuser: Secondary | ICD-10-CM | POA: Diagnosis not present

## 2021-09-19 DIAGNOSIS — F1120 Opioid dependence, uncomplicated: Secondary | ICD-10-CM | POA: Diagnosis not present

## 2021-09-25 DIAGNOSIS — Z7151 Drug abuse counseling and surveillance of drug abuser: Secondary | ICD-10-CM | POA: Diagnosis not present

## 2021-09-25 DIAGNOSIS — F1120 Opioid dependence, uncomplicated: Secondary | ICD-10-CM | POA: Diagnosis not present

## 2021-09-26 DIAGNOSIS — F1120 Opioid dependence, uncomplicated: Secondary | ICD-10-CM | POA: Diagnosis not present

## 2021-09-26 DIAGNOSIS — Z03818 Encounter for observation for suspected exposure to other biological agents ruled out: Secondary | ICD-10-CM | POA: Diagnosis not present

## 2021-09-27 DIAGNOSIS — F1120 Opioid dependence, uncomplicated: Secondary | ICD-10-CM | POA: Diagnosis not present

## 2021-10-01 DIAGNOSIS — F1120 Opioid dependence, uncomplicated: Secondary | ICD-10-CM | POA: Diagnosis not present

## 2021-10-01 DIAGNOSIS — Z7151 Drug abuse counseling and surveillance of drug abuser: Secondary | ICD-10-CM | POA: Diagnosis not present

## 2021-10-02 DIAGNOSIS — F1120 Opioid dependence, uncomplicated: Secondary | ICD-10-CM | POA: Diagnosis not present

## 2021-10-03 DIAGNOSIS — Z7151 Drug abuse counseling and surveillance of drug abuser: Secondary | ICD-10-CM | POA: Diagnosis not present

## 2021-10-03 DIAGNOSIS — F1120 Opioid dependence, uncomplicated: Secondary | ICD-10-CM | POA: Diagnosis not present

## 2021-10-08 DIAGNOSIS — F1120 Opioid dependence, uncomplicated: Secondary | ICD-10-CM | POA: Diagnosis not present

## 2021-10-08 DIAGNOSIS — Z7151 Drug abuse counseling and surveillance of drug abuser: Secondary | ICD-10-CM | POA: Diagnosis not present

## 2021-10-10 DIAGNOSIS — F1120 Opioid dependence, uncomplicated: Secondary | ICD-10-CM | POA: Diagnosis not present

## 2021-10-10 DIAGNOSIS — Z7151 Drug abuse counseling and surveillance of drug abuser: Secondary | ICD-10-CM | POA: Diagnosis not present

## 2021-10-12 ENCOUNTER — Emergency Department (HOSPITAL_COMMUNITY)
Admission: EM | Admit: 2021-10-12 | Discharge: 2021-10-13 | Disposition: A | Discharge status: Home / Self Care | Payer: BC Managed Care – PPO | Attending: Emergency Medicine | Admitting: Emergency Medicine

## 2021-10-12 ENCOUNTER — Encounter (HOSPITAL_COMMUNITY): Payer: Self-pay

## 2021-10-12 ENCOUNTER — Other Ambulatory Visit: Payer: Self-pay

## 2021-10-12 ENCOUNTER — Emergency Department (HOSPITAL_COMMUNITY): Payer: BC Managed Care – PPO

## 2021-10-12 DIAGNOSIS — R079 Chest pain, unspecified: Secondary | ICD-10-CM | POA: Diagnosis not present

## 2021-10-12 DIAGNOSIS — F1120 Opioid dependence, uncomplicated: Secondary | ICD-10-CM | POA: Diagnosis not present

## 2021-10-12 DIAGNOSIS — R0789 Other chest pain: Secondary | ICD-10-CM | POA: Insufficient documentation

## 2021-10-12 DIAGNOSIS — F17210 Nicotine dependence, cigarettes, uncomplicated: Secondary | ICD-10-CM | POA: Diagnosis not present

## 2021-10-12 LAB — CBC WITH DIFFERENTIAL/PLATELET
Abs Immature Granulocytes: 0.05 10*3/uL (ref 0.00–0.07)
Basophils Absolute: 0.1 10*3/uL (ref 0.0–0.1)
Basophils Relative: 1 %
Eosinophils Absolute: 0.4 10*3/uL (ref 0.0–0.5)
Eosinophils Relative: 4 %
HCT: 44.1 % (ref 39.0–52.0)
Hemoglobin: 14.9 g/dL (ref 13.0–17.0)
Immature Granulocytes: 1 %
Lymphocytes Relative: 27 %
Lymphs Abs: 2.5 10*3/uL (ref 0.7–4.0)
MCH: 30.4 pg (ref 26.0–34.0)
MCHC: 33.8 g/dL (ref 30.0–36.0)
MCV: 90 fL (ref 80.0–100.0)
Monocytes Absolute: 0.9 10*3/uL (ref 0.1–1.0)
Monocytes Relative: 10 %
Neutro Abs: 5.6 10*3/uL (ref 1.7–7.7)
Neutrophils Relative %: 57 %
Platelets: 244 10*3/uL (ref 150–400)
RBC: 4.9 MIL/uL (ref 4.22–5.81)
RDW: 12.8 % (ref 11.5–15.5)
WBC: 9.6 10*3/uL (ref 4.0–10.5)
nRBC: 0 % (ref 0.0–0.2)

## 2021-10-12 LAB — BASIC METABOLIC PANEL
Anion gap: 10 (ref 5–15)
BUN: 19 mg/dL (ref 6–20)
CO2: 28 mmol/L (ref 22–32)
Calcium: 8.6 mg/dL — ABNORMAL LOW (ref 8.9–10.3)
Chloride: 98 mmol/L (ref 98–111)
Creatinine, Ser: 0.95 mg/dL (ref 0.61–1.24)
GFR, Estimated: >60 mL/min (ref >60)
Glucose, Bld: 166 mg/dL — ABNORMAL HIGH (ref 70–99)
Potassium: 3.4 mmol/L — ABNORMAL LOW (ref 3.5–5.1)
Sodium: 136 mmol/L (ref 135–145)

## 2021-10-12 LAB — TROPONIN I (HIGH SENSITIVITY): Troponin I (High Sensitivity): 4 ng/L (ref <18)

## 2021-10-12 MED ORDER — ACETAMINOPHEN 325 MG PO TABS: 650.0000 mg | ORAL_TABLET | Freq: Once | ORAL | Status: DC

## 2021-10-12 MED ORDER — IBUPROFEN 400 MG PO TABS
600.0000 mg | ORAL_TABLET | Freq: Once | ORAL | Status: AC
  Administered 2021-10-12: 600 mg via ORAL
  Filled 2021-10-12: qty 2

## 2021-10-12 NOTE — ED Triage Notes (Signed)
Left side chest pain with left arm pain and numbness. Short of breath.  He was laying in bed with wife when pain started suddenly and said his heart started beating fast  Started around 9:30pm

## 2021-10-12 NOTE — ED Provider Notes (Signed)
[facility] EMERGENCY DEPARTMENT Provider Note   CSN: [id number] Arrival date & time: 10/12/21  2149     History Chief Complaint  Patient presents with  . Chest Pain    John J Swaz[place] Jr. is a 45 y.o. male.  Patient presents chief complaint of chest pain describes a sharp aching left-sided chest pain, states that it comes intermittently throughout the day and night.  Currently denies any chest pain but states it comes back as a sharp left-sided chest pain the last 1 to 2 seconds and then resolves before coming every few minutes.  Nothing seems make it better nothing seems to make it worse.  Denies any fevers or cough no vomiting or diarrhea reported.      Past Medical History:  Diagnosis Date  . Anxiety   . Bronchitis   . Depression   . Opiate addiction (HCC)   . Panic attacks   . Pneumonia   . Testicular torsion     Patient Active Problem List   Diagnosis Date Noted  . CAP (community acquired pneumonia) 04/10/2016    Past Surgical History:  Procedure Laterality Date  . APPENDECTOMY    . EYE SURGERY    . NASAL SINUS SURGERY    . VASECTOMY         Family History  Problem Relation Age of Onset  . Diabetes Mother   . Hypertension Father   . Cancer Other     Social History   Tobacco Use  . Smoking status: Every Day    Packs/day: 2.00    Types: Cigarettes  . Smokeless tobacco: Never  Vaping Use  . Vaping Use: Never used  Substance Use Topics  . Alcohol use: No  . Drug use: Not Currently    Types: Marijuana    Comment: takes suboxin    Home Medications Prior to Admission medications   Medication Sig Start Date End Date Taking? Authorizing Provider  gabapentin (NEURONTIN) 300 MG capsule Take 300 mg by mouth 4 (four) times daily.    [provider]  ipratropium-albuterol (DUONEB) 0.5-2.5 (3) MG/3ML SOLN Take by nebulization. 06/03/21   [provider]  methocarbamol (ROBAXIN) 500 MG tablet Take 1 tablet (500 mg total) by mouth 2  (two) times daily. 06/16/21   Couture, Cortni S, PA-C    Allergies    No known allergies and Other  Review of Systems   Review of Systems  Constitutional:  Negative for fever.  HENT:  Negative for ear pain and sore throat.   Eyes:  Negative for pain.  Respiratory:  Negative for cough.   Cardiovascular:  Positive for chest pain.  Gastrointestinal:  Negative for abdominal pain.  Genitourinary:  Negative for flank pain.  Musculoskeletal:  Negative for back pain.  Skin:  Negative for color change and rash.  Neurological:  Negative for syncope.  All other systems reviewed and are negative.  Physical Exam Updated Vital Signs BP (!) 166/91   Pulse 86   Temp 97.8 F (36.6 C)   Resp 14   Ht 5\' 10"  (1.778 m)   Wt 99.8 kg   SpO2 98%   BMI 31.57 kg/m   Physical Exam Constitutional:      Appearance: He is well-developed.  HENT:     Head: Normocephalic.     Nose: Nose normal.  Eyes:     Extraocular Movements: Extraocular movements intact.  Cardiovascular:     Rate and Rhythm: Normal rate.  Pulmonary:     Effort:  Pulmonary effort is normal.  Musculoskeletal:     Comments: Distal pulses are 2+ and normal bilateral upper and lower extremities.  Good cap refill good perfusion noted.  Skin:    Coloration: Skin is not jaundiced.  Neurological:     Mental Status: He is alert. Mental status is at baseline.    ED Results / Procedures / Treatments   Labs (all labs ordered are listed, but only abnormal results are displayed) Labs Reviewed  BASIC METABOLIC PANEL - Abnormal; Notable for the following components:      Result Value   Potassium 3.4 (*)    Glucose, Bld 166 (*)    Calcium 8.6 (*)    All other components within normal limits  CBC WITH DIFFERENTIAL/PLATELET  TROPONIN I (HIGH SENSITIVITY)    EKG None  Radiology DG Chest Port 1 View  Result Date: 10/12/2021 CLINICAL DATA:  Left-sided chest pain for a few hours, initial encounter EXAM: PORTABLE CHEST 1 VIEW  COMPARISON:  07/04/2021 FINDINGS: Cardiac shadow is stable. The lungs are well aerated bilaterally. Slight increased interstitial markings are noted likely related to bronchitis. No focal confluent infiltrate is seen. No bony abnormality is noted. IMPRESSION: Changes most consistent with bronchitis. No acute infiltrate is seen. Electronically Signed   By: Alcide Clever M.D.   On: 10/12/2021 22:38    Procedures Procedures   Medications Ordered in ED Medications  ibuprofen (ADVIL) tablet 600 mg (600 mg Oral Given 10/12/21 2219)    ED Course  I have reviewed the triage vital signs and the nursing notes.  Pertinent labs & imaging results that were available during my care of the patient were reviewed by me and considered in my medical decision making (see chart for details).    MDM Rules/Calculators/A&P                           Initial labs unremarkable white count normal chemistry normal troponin negative.  EKG was sinus rhythm normal rate no ST elevations, no ST depressions.  Pending second troponin, will be signed out to oncoming physician provider.  Final Clinical Impression(s) / ED Diagnoses Final diagnoses:  Nonspecific chest pain    Rx / DC Orders ED Discharge Orders     None        Cheryll Cockayne, MD 10/12/21 2304

## 2021-10-13 LAB — TROPONIN I (HIGH SENSITIVITY): Troponin I (High Sensitivity): 4 ng/L (ref <18)

## 2021-10-13 NOTE — ED Provider Notes (Signed)
  Physical Exam  BP 125/76   Pulse 81   Temp 97.8 F (36.6 C)   Resp (!) 21   Ht 5\' 10"  (1.778 m)   Wt 99.8 kg   SpO2 95%   BMI 31.57 kg/m   Physical Exam Vitals and nursing note reviewed.  Constitutional:      General: He is not in acute distress.    Appearance: He is well-developed. He is not ill-appearing.  Pulmonary:     Effort: Pulmonary effort is normal.  Musculoskeletal:     Cervical back: Normal range of motion.  Skin:    General: Skin is warm and dry.  Neurological:     Mental Status: He is alert.    ED Course/Procedures     Procedures  MDM  Care assumed from Dr. at shift change.  Patient presenting here after an episode of chest discomfort that seems atypical for cardiac pain.  His EKG is unchanged and troponin x2 is negative.  Care signed out to me awaiting results of the second troponin.  Now that this has returned, patient is symptom-free and seems appropriate for discharge.  I will recommend outpatient follow-up with cardiology and patient is to return as needed if he worsens in the meantime.       Audley Hose, MD 10/13/21 [phone]

## 2021-10-13 NOTE — Discharge Instructions (Addendum)
Follow-up with cardiology in the next few days.  The contact information for the cardiology clinic here in [place] has been provided in this discharge summary for you to call on Monday to make these arrangements.  Return to the ER in the meantime if your symptoms worsen or change.

## 2021-10-17 DIAGNOSIS — Z7151 Drug abuse counseling and surveillance of drug abuser: Secondary | ICD-10-CM | POA: Diagnosis not present

## 2021-10-17 DIAGNOSIS — F1120 Opioid dependence, uncomplicated: Secondary | ICD-10-CM | POA: Diagnosis not present

## 2021-10-22 DIAGNOSIS — Z7151 Drug abuse counseling and surveillance of drug abuser: Secondary | ICD-10-CM | POA: Diagnosis not present

## 2021-10-22 DIAGNOSIS — F1120 Opioid dependence, uncomplicated: Secondary | ICD-10-CM | POA: Diagnosis not present

## 2021-10-24 DIAGNOSIS — Z7151 Drug abuse counseling and surveillance of drug abuser: Secondary | ICD-10-CM | POA: Diagnosis not present

## 2021-10-24 DIAGNOSIS — F1120 Opioid dependence, uncomplicated: Secondary | ICD-10-CM | POA: Diagnosis not present

## 2021-10-24 DIAGNOSIS — Z03818 Encounter for observation for suspected exposure to other biological agents ruled out: Secondary | ICD-10-CM | POA: Diagnosis not present

## 2021-10-25 DIAGNOSIS — F1120 Opioid dependence, uncomplicated: Secondary | ICD-10-CM | POA: Diagnosis not present

## 2021-10-30 DIAGNOSIS — F1120 Opioid dependence, uncomplicated: Secondary | ICD-10-CM | POA: Diagnosis not present

## 2021-10-31 DIAGNOSIS — F1120 Opioid dependence, uncomplicated: Secondary | ICD-10-CM | POA: Diagnosis not present

## 2021-10-31 DIAGNOSIS — Z7151 Drug abuse counseling and surveillance of drug abuser: Secondary | ICD-10-CM | POA: Diagnosis not present

## 2021-11-01 DIAGNOSIS — F1120 Opioid dependence, uncomplicated: Secondary | ICD-10-CM | POA: Diagnosis not present

## 2021-11-01 DIAGNOSIS — Z03818 Encounter for observation for suspected exposure to other biological agents ruled out: Secondary | ICD-10-CM | POA: Diagnosis not present

## 2021-11-07 DIAGNOSIS — F1120 Opioid dependence, uncomplicated: Secondary | ICD-10-CM | POA: Diagnosis not present

## 2021-11-07 DIAGNOSIS — Z7151 Drug abuse counseling and surveillance of drug abuser: Secondary | ICD-10-CM | POA: Diagnosis not present

## 2021-11-12 DIAGNOSIS — Z7151 Drug abuse counseling and surveillance of drug abuser: Secondary | ICD-10-CM | POA: Diagnosis not present

## 2021-11-12 DIAGNOSIS — F1120 Opioid dependence, uncomplicated: Secondary | ICD-10-CM | POA: Diagnosis not present

## 2021-11-14 DIAGNOSIS — Z03818 Encounter for observation for suspected exposure to other biological agents ruled out: Secondary | ICD-10-CM | POA: Diagnosis not present

## 2021-11-14 DIAGNOSIS — Z7151 Drug abuse counseling and surveillance of drug abuser: Secondary | ICD-10-CM | POA: Diagnosis not present

## 2021-11-14 DIAGNOSIS — F1120 Opioid dependence, uncomplicated: Secondary | ICD-10-CM | POA: Diagnosis not present

## 2021-11-15 DIAGNOSIS — Z7151 Drug abuse counseling and surveillance of drug abuser: Secondary | ICD-10-CM | POA: Diagnosis not present

## 2021-11-15 DIAGNOSIS — F1120 Opioid dependence, uncomplicated: Secondary | ICD-10-CM | POA: Diagnosis not present

## 2021-11-19 DIAGNOSIS — Z7151 Drug abuse counseling and surveillance of drug abuser: Secondary | ICD-10-CM | POA: Diagnosis not present

## 2021-11-19 DIAGNOSIS — F1120 Opioid dependence, uncomplicated: Secondary | ICD-10-CM | POA: Diagnosis not present

## 2021-11-27 ENCOUNTER — Ambulatory Visit (INDEPENDENT_AMBULATORY_CARE_PROVIDER_SITE_OTHER): Payer: BC Managed Care – PPO | Admitting: Cardiology

## 2021-11-27 ENCOUNTER — Encounter: Payer: Self-pay | Admitting: *Deleted

## 2021-11-27 ENCOUNTER — Encounter: Payer: Self-pay | Admitting: Cardiology

## 2021-11-27 VITALS — BP 136/75 | HR 72 | Ht 70.0 in | Wt 236.6 lb

## 2021-11-27 DIAGNOSIS — R002 Palpitations: Secondary | ICD-10-CM

## 2021-11-27 DIAGNOSIS — R0789 Other chest pain: Secondary | ICD-10-CM | POA: Diagnosis not present

## 2021-11-27 NOTE — Progress Notes (Signed)
Clinical Summary John [name] is a 45 y.o.male seen today as a new patient for the following medical problems.    1.Chest pain -10/13/2021 ER visit with chest pain - trops neg x 2, EKG NSR, CXR possible bronchitis  - tends to happen in early AM. Got up to use the bathroom. Sharp pain left chest, +palpitations, +SOB, dizzy. Tried to wait out symptoms but they progressed, went to ER.  - intermittent sharp pain over 2-3 hours, palpitations lasted 2-3 hours. 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 No exertional symptoms     SH: has 7 children Past Medical History:  Diagnosis Date   Anxiety    Bronchitis    Depression    Opiate addiction (HCC)    Panic attacks    Pneumonia    Testicular torsion      Allergies  Allergen Reactions   No Known Allergies    Other     No narcotics please     Current Outpatient Medications  Medication Sig Dispense Refill   gabapentin (NEURONTIN) 300 MG capsule Take 300 mg by mouth 4 (four) times daily.     ipratropium-albuterol (DUONEB) 0.5-2.5 (3) MG/3ML SOLN Take by nebulization.     methocarbamol (ROBAXIN) 500 MG tablet Take 1 tablet (500 mg total) by mouth 2 (two) times daily. 20 tablet 0   No current facility-administered medications for this visit.     Past Surgical History:  Procedure Laterality Date   APPENDECTOMY     EYE SURGERY     NASAL SINUS SURGERY     VASECTOMY       Allergies  Allergen Reactions   No Known Allergies    Other     No narcotics please      Family History  Problem Relation Age of Onset   Diabetes Mother    Hypertension Father    Cancer Other      Social History John [name] reports that he has been smoking cigarettes. He has been smoking an average of 2 packs per day. He has never used smokeless tobacco. John [name] reports no history of alcohol  use.   Review of Systems CONSTITUTIONAL: No weight loss, fever, chills, weakness or fatigue.  HEENT: Eyes: No visual loss, blurred vision, double vision or yellow sclerae.No hearing loss, sneezing, congestion, runny nose or sore throat.  SKIN: No rash or itching.  CARDIOVASCULAR: per hpi RESPIRATORY: per hpi GASTROINTESTINAL: No anorexia, nausea, vomiting or diarrhea. No abdominal pain or blood.  GENITOURINARY: No burning on urination, no polyuria NEUROLOGICAL: No headache, dizziness, syncope, paralysis, ataxia, numbness or tingling in the extremities. No change in bowel or bladder control.  MUSCULOSKELETAL: No muscle, back pain, joint pain or stiffness.  LYMPHATICS: No enlarged nodes. No history of splenectomy.  PSYCHIATRIC: No history of depression or anxiety.  ENDOCRINOLOGIC: No reports of sweating, cold or heat intolerance. No polyuria or polydipsia.  Marland Kitchen   Physical Examination Today's Vitals   11/27/21 0858  BP: 136/75  Pulse: 72  SpO2: 98%  Weight: 236 lb 9.6 oz (107.3 kg)  Height: 5\' 10"  (1.778 m)   Body mass index is 33.95 kg/m.  Gen: resting comfortably, no acute distress HEENT: no scleral icterus, pupils equal round and reactive, no palptable cervical adenopathy,  CV: RRR, no m/r/g no jvd Resp: Clear to auscultation  bilaterally GI: abdomen is soft, non-tender, non-distended, normal bowel sounds, no hepatosplenomegaly MSK: extremities are warm, no edema.  Skin: warm, no rash Neuro:  no focal deficits Psych: appropriate affect     Assessment and Plan  1.Chest pain/palpitations - symptoms most suggestive of symptomatic arrhythmia - will plan for 14 day zio patch to further evaluate   F/u pending monitor results      Antoine Poche, M.D.

## 2021-11-27 NOTE — Patient Instructions (Signed)
Medication Instructions:  Continue all current medications.  Labwork: none  Testing/Procedures: Your physician has recommended that you wear a 14 day event monitor. Event monitors are medical devices that record the heart's electrical activity. Doctors most often [place] these monitors to diagnose arrhythmias. Arrhythmias are problems with the speed or rhythm of the heartbeat. The monitor is a small, portable device. You can wear one while you do your normal daily activities. This is usually used to diagnose what is causing palpitations/syncope (passing out). Office will contact with results via phone or letter.     Follow-Up: Pending   Any Other Special Instructions Will Be Listed Below (If Applicable).   If you need a refill on your cardiac medications before your next appointment, please call your pharmacy.

## 2021-11-28 DIAGNOSIS — F1120 Opioid dependence, uncomplicated: Secondary | ICD-10-CM | POA: Diagnosis not present

## 2021-11-28 DIAGNOSIS — Z7151 Drug abuse counseling and surveillance of drug abuser: Secondary | ICD-10-CM | POA: Diagnosis not present

## 2021-11-29 ENCOUNTER — Ambulatory Visit: Payer: BC Managed Care – PPO

## 2021-11-29 ENCOUNTER — Other Ambulatory Visit: Payer: Self-pay | Admitting: Dermatology

## 2021-11-29 DIAGNOSIS — R002 Palpitations: Secondary | ICD-10-CM

## 2021-12-05 DIAGNOSIS — Z7151 Drug abuse counseling and surveillance of drug abuser: Secondary | ICD-10-CM | POA: Diagnosis not present

## 2021-12-05 DIAGNOSIS — F1120 Opioid dependence, uncomplicated: Secondary | ICD-10-CM | POA: Diagnosis not present

## 2021-12-10 DIAGNOSIS — Z7151 Drug abuse counseling and surveillance of drug abuser: Secondary | ICD-10-CM | POA: Diagnosis not present

## 2021-12-10 DIAGNOSIS — F1120 Opioid dependence, uncomplicated: Secondary | ICD-10-CM | POA: Diagnosis not present

## 2021-12-11 DIAGNOSIS — Z7151 Drug abuse counseling and surveillance of drug abuser: Secondary | ICD-10-CM | POA: Diagnosis not present

## 2021-12-11 DIAGNOSIS — F1120 Opioid dependence, uncomplicated: Secondary | ICD-10-CM | POA: Diagnosis not present

## 2021-12-13 DIAGNOSIS — F1120 Opioid dependence, uncomplicated: Secondary | ICD-10-CM | POA: Diagnosis not present

## 2021-12-19 DIAGNOSIS — Z7151 Drug abuse counseling and surveillance of drug abuser: Secondary | ICD-10-CM | POA: Diagnosis not present

## 2021-12-19 DIAGNOSIS — F1120 Opioid dependence, uncomplicated: Secondary | ICD-10-CM | POA: Diagnosis not present

## 2021-12-24 DIAGNOSIS — F1120 Opioid dependence, uncomplicated: Secondary | ICD-10-CM | POA: Diagnosis not present

## 2021-12-24 DIAGNOSIS — Z7151 Drug abuse counseling and surveillance of drug abuser: Secondary | ICD-10-CM | POA: Diagnosis not present

## 2021-12-24 DIAGNOSIS — R002 Palpitations: Secondary | ICD-10-CM | POA: Diagnosis not present

## 2021-12-26 DIAGNOSIS — Z7151 Drug abuse counseling and surveillance of drug abuser: Secondary | ICD-10-CM | POA: Diagnosis not present

## 2021-12-26 DIAGNOSIS — F1120 Opioid dependence, uncomplicated: Secondary | ICD-10-CM | POA: Diagnosis not present

## 2021-12-27 DIAGNOSIS — F1120 Opioid dependence, uncomplicated: Secondary | ICD-10-CM | POA: Diagnosis not present

## 2022-01-02 DIAGNOSIS — F1120 Opioid dependence, uncomplicated: Secondary | ICD-10-CM | POA: Diagnosis not present

## 2022-01-02 DIAGNOSIS — Z7151 Drug abuse counseling and surveillance of drug abuser: Secondary | ICD-10-CM | POA: Diagnosis not present

## 2022-01-08 DIAGNOSIS — F1120 Opioid dependence, uncomplicated: Secondary | ICD-10-CM | POA: Diagnosis not present

## 2022-01-08 DIAGNOSIS — Z7151 Drug abuse counseling and surveillance of drug abuser: Secondary | ICD-10-CM | POA: Diagnosis not present

## 2022-01-09 DIAGNOSIS — Z7151 Drug abuse counseling and surveillance of drug abuser: Secondary | ICD-10-CM | POA: Diagnosis not present

## 2022-01-09 DIAGNOSIS — F1120 Opioid dependence, uncomplicated: Secondary | ICD-10-CM | POA: Diagnosis not present

## 2022-01-09 DIAGNOSIS — Z03818 Encounter for observation for suspected exposure to other biological agents ruled out: Secondary | ICD-10-CM | POA: Diagnosis not present

## 2022-01-16 DIAGNOSIS — Z7151 Drug abuse counseling and surveillance of drug abuser: Secondary | ICD-10-CM | POA: Diagnosis not present

## 2022-01-16 DIAGNOSIS — F1120 Opioid dependence, uncomplicated: Secondary | ICD-10-CM | POA: Diagnosis not present

## 2022-01-17 DIAGNOSIS — F1120 Opioid dependence, uncomplicated: Secondary | ICD-10-CM | POA: Diagnosis not present

## 2022-01-17 DIAGNOSIS — Z7151 Drug abuse counseling and surveillance of drug abuser: Secondary | ICD-10-CM | POA: Diagnosis not present

## 2022-01-18 ENCOUNTER — Emergency Department (HOSPITAL_COMMUNITY)
Admission: EM | Admit: 2022-01-18 | Discharge: 2022-01-18 | Disposition: A | Discharge status: Home / Self Care | Payer: BC Managed Care – PPO | Source: Home / Self Care

## 2022-01-22 ENCOUNTER — Ambulatory Visit
Admission: EM | Admit: 2022-01-22 | Discharge: 2022-01-22 | Disposition: A | Discharge status: Home / Self Care | Payer: BC Managed Care – PPO | Attending: Family Medicine | Admitting: Family Medicine

## 2022-01-22 ENCOUNTER — Other Ambulatory Visit: Payer: Self-pay

## 2022-01-22 DIAGNOSIS — R062 Wheezing: Secondary | ICD-10-CM

## 2022-01-22 DIAGNOSIS — M79671 Pain in right foot: Secondary | ICD-10-CM

## 2022-01-22 DIAGNOSIS — R0602 Shortness of breath: Secondary | ICD-10-CM

## 2022-01-22 MED ORDER — BUDESONIDE-FORMOTEROL FUMARATE 160-4.5 MCG/ACT IN AERO: 2.0000 | INHALATION_SPRAY | Freq: Two times a day (BID) | RESPIRATORY_TRACT | 0 refills | Status: DC

## 2022-01-22 MED ORDER — PREDNISONE 20 MG PO TABS: 40.0000 mg | ORAL_TABLET | Freq: Every day | ORAL | 0 refills | Status: DC

## 2022-01-22 NOTE — ED Triage Notes (Signed)
Pt also concerned that he has bronchitis

## 2022-01-22 NOTE — ED Provider Notes (Signed)
[facility]    CSN: [id number] Arrival date & time: 01/22/22  [id number]      History   Chief Complaint Chief Complaint  Patient presents with   Foot Pain    HPI Filbert J Swazil[place] Jr. is a 46 y.o. male.   Presenting today with 5-day history of persistent bruising, pain, swelling in multiple areas of his right foot after trying to catch a kickboxing bag with the foot and it landing wrong.  He states he has been working through the pain and walking around on it, performing his job duties but the pain is significant and not improving.  Has been taking ibuprofen with minimal relief.  He is also having about 2 weeks of ongoing wheezing, chest tightness.  Has been told in the past that he has COPD, has inhalers and nebulizers at home and has used his inhaler a couple of times with very minimal relief.  Denies fever, chills, chest pain, severe shortness of breath.   Past Medical History:  Diagnosis Date   Anxiety    Bronchitis    Depression    Opiate addiction (HCC)    Panic attacks    Pneumonia    Testicular torsion     Patient Active Problem List   Diagnosis Date Noted   CAP (community acquired pneumonia) 04/10/2016    Past Surgical History:  Procedure Laterality Date   APPENDECTOMY     EYE SURGERY     NASAL SINUS SURGERY     VASECTOMY         Home Medications    Prior to Admission medications   Medication Sig Start Date End Date Taking? Authorizing Provider  budesonide-formoterol (SYMBICORT) 160-4.5 MCG/ACT inhaler Inhale 2 puffs into the lungs 2 (two) times daily. Rinse mouth well with water after each use 01/22/22  Yes Particia NearingLane, Jericca Russett Elizabeth, PA-C  predniSONE (DELTASONE) 20 MG tablet Take 2 tablets (40 mg total) by mouth daily with breakfast. 01/22/22  Yes Particia NearingLane, [place] Elizabeth, PA-C  Buprenorphine HCl-Naloxone HCl 12-3 MG FILM Place 1 strip under the tongue 2 (two) times daily. 11/15/21   [provider]    Family History Family History  Problem  Relation Age of Onset   Diabetes Mother    Hypertension Father    Cancer Other     Social History Social History   Tobacco Use   Smoking status: Every Day    Packs/day: 2.00    Types: Cigarettes   Smokeless tobacco: Never  Vaping Use   Vaping Use: Never used  Substance Use Topics   Alcohol use: No   Drug use: Not Currently    Types: Marijuana    Comment: takes suboxin     Allergies   No known allergies and Other   Review of Systems Review of Systems Per HPI  Physical Exam Triage Vital Signs ED Triage Vitals  Enc Vitals Group     BP 01/22/22 1844 (!) 145/75     Pulse Rate 01/22/22 1844 74     Resp 01/22/22 1844 20     Temp 01/22/22 1844 98.7 F (37.1 C)     Temp src --      SpO2 01/22/22 1844 93 %     Weight --      Height --      Head Circumference --      Peak Flow --      Pain Score 01/22/22 1843 5     Pain Loc --  Pain Edu? --      Excl. in GC? --    No data found.  Updated Vital Signs BP (!) 145/75    Pulse 74    Temp 98.7 F (37.1 C)    Resp 20    SpO2 93%   Visual Acuity Right Eye Distance:   Left Eye Distance:   Bilateral Distance:    Right Eye Near:   Left Eye Near:    Bilateral Near:     Physical Exam Vitals and nursing note reviewed.  Constitutional:      Appearance: Normal appearance.  HENT:     Head: Atraumatic.     Nose: Nose normal.     Mouth/Throat:     Mouth: Mucous membranes are moist.     Pharynx: Oropharynx is clear.  Eyes:     Extraocular Movements: Extraocular movements intact.     Conjunctiva/sclera: Conjunctivae normal.  Cardiovascular:     Rate and Rhythm: Normal rate and regular rhythm.  Pulmonary:     Effort: Pulmonary effort is normal.     Breath sounds: Wheezing present. No rales.  Musculoskeletal:        General: Swelling, tenderness and signs of injury present. Normal range of motion.     Cervical back: Normal range of motion and neck supple.     Comments: Mildly antalgic gait but range of motion  full and intact.  Bruising to the second and third toes right foot, pain and swelling dorsal right foot.  No obvious bony deformity to palpation  Skin:    General: Skin is warm and dry.  Neurological:     General: No focal deficit present.     Mental Status: He is oriented to person, place, and time.     Motor: No weakness.     Comments: Right foot neurovascularly intact  Psychiatric:        Mood and Affect: Mood normal.        Thought Content: Thought content normal.        Judgment: Judgment normal.     UC Treatments / Results  Labs (all labs ordered are listed, but only abnormal results are displayed) Labs Reviewed - No data to display  EKG   Radiology No results found.  Procedures Procedures (including critical care time)  Medications Ordered in UC Medications - No data to display  Initial Impression / Assessment and Plan / UC Course  I have reviewed the triage vital signs and the nursing notes.  Pertinent labs & imaging results that were available during my care of the patient were reviewed by me and considered in my medical decision making (see chart for details).   Vital signs reassuring today, will treat his wheezing and shortness of breath with prednisone, add Symbicort for maintenance and albuterol as needed.  Unfortunately do not have a radiology tech in clinic today so will order an outpatient x-ray to [facility] for further evaluation of his right foot injury, placed in a postop shoe and discussed RICE protocol, over-the-counter pain relievers while awaiting results.  Return for acutely worsening symptoms.  Final Clinical Impressions(s) / UC Diagnoses   Final diagnoses:  Right foot pain  Wheezing  SOB (shortness of breath)   Discharge Instructions   None    ED Prescriptions     Medication Sig Dispense Auth. Provider   predniSONE (DELTASONE) 20 MG tablet Take 2 tablets (40 mg total) by mouth daily with breakfast. 10 tablet Particia Nearing, [place]  budesonide-formoterol (SYMBICORT) 160-4.5 MCG/ACT inhaler Inhale 2 puffs into the lungs 2 (two) times daily. Rinse mouth well with water after each use 1 each Particia Nearing, PA-C      PDMP not reviewed this encounter.   Particia Nearing, [place] 01/22/22 1922

## 2022-01-22 NOTE — ED Triage Notes (Signed)
Pt presents with c/o right foot pain from kicking boxing bag, bruising noted , occurred on Friday

## 2022-01-23 DIAGNOSIS — F1120 Opioid dependence, uncomplicated: Secondary | ICD-10-CM | POA: Diagnosis not present

## 2022-01-23 DIAGNOSIS — Z7151 Drug abuse counseling and surveillance of drug abuser: Secondary | ICD-10-CM | POA: Diagnosis not present

## 2022-01-28 DIAGNOSIS — F1120 Opioid dependence, uncomplicated: Secondary | ICD-10-CM | POA: Diagnosis not present

## 2022-01-28 DIAGNOSIS — Z7151 Drug abuse counseling and surveillance of drug abuser: Secondary | ICD-10-CM | POA: Diagnosis not present

## 2022-01-30 DIAGNOSIS — F1120 Opioid dependence, uncomplicated: Secondary | ICD-10-CM | POA: Diagnosis not present

## 2022-01-31 DIAGNOSIS — F1120 Opioid dependence, uncomplicated: Secondary | ICD-10-CM | POA: Diagnosis not present

## 2022-02-06 DIAGNOSIS — Z03818 Encounter for observation for suspected exposure to other biological agents ruled out: Secondary | ICD-10-CM | POA: Diagnosis not present

## 2022-02-06 DIAGNOSIS — Z7151 Drug abuse counseling and surveillance of drug abuser: Secondary | ICD-10-CM | POA: Diagnosis not present

## 2022-02-06 DIAGNOSIS — F1120 Opioid dependence, uncomplicated: Secondary | ICD-10-CM | POA: Diagnosis not present

## 2022-02-07 DIAGNOSIS — Z7151 Drug abuse counseling and surveillance of drug abuser: Secondary | ICD-10-CM | POA: Diagnosis not present

## 2022-02-07 DIAGNOSIS — F1120 Opioid dependence, uncomplicated: Secondary | ICD-10-CM | POA: Diagnosis not present

## 2022-02-09 ENCOUNTER — Emergency Department (HOSPITAL_COMMUNITY): Payer: BC Managed Care – PPO

## 2022-02-09 ENCOUNTER — Encounter (HOSPITAL_COMMUNITY): Payer: Self-pay

## 2022-02-09 ENCOUNTER — Other Ambulatory Visit: Payer: Self-pay

## 2022-02-09 ENCOUNTER — Emergency Department (HOSPITAL_COMMUNITY)
Admission: EM | Admit: 2022-02-09 | Discharge: 2022-02-09 | Disposition: A | Discharge status: Home / Self Care | Payer: BC Managed Care – PPO | Attending: Emergency Medicine | Admitting: Emergency Medicine

## 2022-02-09 DIAGNOSIS — R059 Cough, unspecified: Secondary | ICD-10-CM | POA: Insufficient documentation

## 2022-02-09 DIAGNOSIS — Z20822 Contact with and (suspected) exposure to covid-19: Secondary | ICD-10-CM | POA: Insufficient documentation

## 2022-02-09 DIAGNOSIS — J441 Chronic obstructive pulmonary disease with (acute) exacerbation: Secondary | ICD-10-CM

## 2022-02-09 DIAGNOSIS — Z7951 Long term (current) use of inhaled steroids: Secondary | ICD-10-CM | POA: Diagnosis not present

## 2022-02-09 DIAGNOSIS — J449 Chronic obstructive pulmonary disease, unspecified: Secondary | ICD-10-CM | POA: Insufficient documentation

## 2022-02-09 DIAGNOSIS — R0789 Other chest pain: Secondary | ICD-10-CM | POA: Insufficient documentation

## 2022-02-09 DIAGNOSIS — F17210 Nicotine dependence, cigarettes, uncomplicated: Secondary | ICD-10-CM | POA: Diagnosis not present

## 2022-02-09 DIAGNOSIS — J40 Bronchitis, not specified as acute or chronic: Secondary | ICD-10-CM | POA: Diagnosis not present

## 2022-02-09 LAB — RESP PANEL BY RT-PCR (FLU A&B, COVID) ARPGX2
Influenza A by PCR: NEGATIVE
Influenza B by PCR: NEGATIVE
SARS Coronavirus 2 by RT PCR: NEGATIVE

## 2022-02-09 MED ORDER — PREDNISONE 10 MG (21) PO TBPK: ORAL_TABLET | Freq: Every day | ORAL | 0 refills | Status: DC

## 2022-02-09 MED ORDER — IPRATROPIUM-ALBUTEROL 0.5-2.5 (3) MG/3ML IN SOLN
3.0000 mL | Freq: Once | RESPIRATORY_TRACT | Status: AC
  Administered 2022-02-09: 3 mL via RESPIRATORY_TRACT
  Filled 2022-02-09: qty 3

## 2022-02-09 NOTE — Discharge Instructions (Signed)
Were seen in the ER today for your wheezing.  You are improved after administration of breathing treatment.  Please take the prescribed steroid as prescribed for the entire course.  You may use your nebulizer treatments and inhalers as needed.  Return to the ER with any new throat symptoms

## 2022-02-09 NOTE — ED Provider Notes (Signed)
[facility] EMERGENCY DEPARTMENT Provider Note   CSN: [id number] Arrival date & time: 02/09/22  1129     History  Chief Complaint  Patient presents with   Bronchitis     John J Martinique Jr. is a 46 y.o. male who presents with concern for chest tightness and dry cough for the last 2 weeks.  Patient does have COPD but is not currently on any medications for this.  Denies fevers or runny nose.  States that he had course of prednisone back in January which did not seem to improve his symptoms very much but states he has also been using his albuterol inhaler more frequently than is recommended due to the chest tightness and feeling like he cannot catch his breath.  He does not have a primary care doctor nor does he follow with pulmonology.  I have personally reviewed this patient's medical records.  He has history of anxiety and depression, polysubstance abuse, and COPD.  He currently smokes 2 packs cigarettes per day.  HPI     Home Medications Prior to Admission medications   Medication Sig Start Date End Date Taking? Authorizing Provider  budesonide-formoterol (SYMBICORT) 160-4.5 MCG/ACT inhaler Inhale 2 puffs into the lungs 2 (two) times daily. Rinse mouth well with water after each use 01/22/22   Volney American, PA-C  Buprenorphine HCl-Naloxone HCl 12-3 MG FILM Place 1 strip under the tongue 2 (two) times daily. 11/15/21   [provider]  predniSONE (DELTASONE) 20 MG tablet Take 2 tablets (40 mg total) by mouth daily with breakfast. 01/22/22   Volney American, PA-C      Allergies    No known allergies and Other    Review of Systems   Review of Systems  Constitutional: Negative.   HENT: Negative.    Eyes: Negative.   Respiratory:  Positive for cough, shortness of breath and wheezing.   Cardiovascular: Negative.   Gastrointestinal: Negative.   Genitourinary: Negative.   Neurological: Negative.   All other systems reviewed and are negative.  Physical  Exam Updated Vital Signs BP (!) 153/76 (BP Location: Right Arm)    Pulse 73    Temp 98 F (36.7 C)    Resp 18    Ht 5\' 10"  (1.778 m)    Wt 107.3 kg    SpO2 95%    BMI 33.94 kg/m  Physical Exam Vitals and nursing note reviewed.  Constitutional:      Appearance: He is not toxic-appearing.  HENT:     Head: Normocephalic and atraumatic.     Nose: Nose normal.     Mouth/Throat:     Mouth: Mucous membranes are moist.     Pharynx: No oropharyngeal exudate or posterior oropharyngeal erythema.  Eyes:     General:        Right eye: No discharge.        Left eye: No discharge.     Extraocular Movements: Extraocular movements intact.     Conjunctiva/sclera: Conjunctivae normal.     Pupils: Pupils are equal, round, and reactive to light.  Cardiovascular:     Rate and Rhythm: Normal rate and regular rhythm.     Pulses: Normal pulses.     Heart sounds: Normal heart sounds. No murmur heard. Pulmonary:     Effort: Accessory muscle usage and prolonged expiration present. No tachypnea, bradypnea, respiratory distress or retractions.     Breath sounds: Decreased air movement present. Examination of the right-upper field reveals wheezing. Examination of the  right-middle field reveals wheezing. Examination of the left-middle field reveals wheezing. Examination of the right-lower field reveals wheezing. Examination of the left-lower field reveals wheezing. Wheezing present. No rales.  Abdominal:     General: Bowel sounds are normal. There is no distension.     Palpations: Abdomen is soft.     Tenderness: There is no abdominal tenderness. There is no right CVA tenderness, left CVA tenderness, guarding or rebound.  Musculoskeletal:        General: No deformity.     Cervical back: Neck supple.  Skin:    General: Skin is warm and dry.     Capillary Refill: Capillary refill takes less than 2 seconds.  Neurological:     Mental Status: He is alert. Mental status is at baseline.  Psychiatric:        Mood  and Affect: Mood normal.    ED Results / Procedures / Treatments   Labs (all labs ordered are listed, but only abnormal results are displayed) Labs Reviewed  RESP PANEL BY RT-PCR (FLU A&B, COVID) [id number]    EKG None  Radiology DG Chest Portable 1 View  Result Date: 02/09/2022 CLINICAL DATA:  46 year old male with history of bronchitis two weeks ago with persistent shortness of breath. EXAM: PORTABLE CHEST 1 VIEW COMPARISON:  Chest x-ray 10/12/2021. FINDINGS: Lung volumes are normal. Diffuse peribronchial cuffing again noted. No consolidative airspace disease. No pleural effusions. No pneumothorax. No pulmonary nodule or mass noted. Pulmonary vasculature and the cardiomediastinal silhouette are within normal limits. IMPRESSION: 1. Persistent diffuse peribronchial cuffing, which could suggest acute or chronic bronchitis. Electronically Signed   By: Vinnie Langton M.D.   On: 02/09/2022 12:26    Procedures Procedures   Medications Ordered in ED Medications - No data to display  ED Course/ Medical Decision Making/ A&P                           Medical Decision Making 47 year old male with COPD who presents with chest tightness and cough for the last 2 weeks.  Differential diagnosis includes was limited to COPD exacerbation, CHF, PE, pleural effusion, pneumonia, ACS, dysrhythmia.  Hypertensive on intake, vital signs otherwise normal.  Oxygen saturation 93% on room air when ambulating and 95% on room air when sitting.  Cardiopulmonary exam revealed decreased air movement throughout the lung fields bilaterally with profuse wheezing.  Otherwise unremarkable.  Abdominal exam is benign.  Patient is neurovascular intact in all 4 extremities.  Amount and/or Complexity of Data Reviewed Labs:     Details: RVP panel negative. Radiology: ordered and independent interpretation performed.    Details: Chest x-ray with changes consistent with chronic bronchitis  Risk Prescription drug  management.  Patient reevaluated after administration of DuoNeb with significant improvement in his symptoms.  He states he feels like he can take a deep breath at this time and would like to be discharged home.  Outpatient steroid taper prescribed.  Patient only does not remember while in the emergency department that he does have a nebulizer machine at home with plenty of albuterol.  Do encourage him to use this as needed.  Will provide information for pulmonology follow-up.  HPI and physical exam today most consistent with mild COPD exacerbation.  No further work-up warranted in the ER at this time.  Clinical concern for underlying issue that would require further ER work-up or inpatient management is exceedingly low.  Sky voiced understanding of his medical evaluation and treatment  plan.  His questions was answered to his expressed satisfaction.  Return precautions were given.  Patient is well-appearing, stable, and was discharged in good condition.   This chart was dictated using voice recognition software, Dragon. Despite the best efforts of this provider to proofread and correct errors, errors may still occur which can change documentation meaning.  Final Clinical Impression(s) / ED Diagnoses Final diagnoses:  None    Rx / DC Orders ED Discharge Orders     None         Aura Dials 02/09/22 1557    Fredia Sorrow, MD 02/10/22 [phone]

## 2022-02-09 NOTE — ED Triage Notes (Addendum)
Pov from home cc of bronchitis for over 2 weeks. Was given prednisone and an inhaler and still isnt any better  93% after ambulating to tx room 95% resting.  States he has a nonproductive cough

## 2022-02-11 DIAGNOSIS — Z7151 Drug abuse counseling and surveillance of drug abuser: Secondary | ICD-10-CM | POA: Diagnosis not present

## 2022-02-11 DIAGNOSIS — F1120 Opioid dependence, uncomplicated: Secondary | ICD-10-CM | POA: Diagnosis not present

## 2022-02-13 DIAGNOSIS — F1120 Opioid dependence, uncomplicated: Secondary | ICD-10-CM | POA: Diagnosis not present

## 2022-02-13 DIAGNOSIS — Z7151 Drug abuse counseling and surveillance of drug abuser: Secondary | ICD-10-CM | POA: Diagnosis not present

## 2022-02-18 DIAGNOSIS — F1120 Opioid dependence, uncomplicated: Secondary | ICD-10-CM | POA: Diagnosis not present

## 2022-02-18 DIAGNOSIS — Z7151 Drug abuse counseling and surveillance of drug abuser: Secondary | ICD-10-CM | POA: Diagnosis not present

## 2022-02-20 DIAGNOSIS — F1120 Opioid dependence, uncomplicated: Secondary | ICD-10-CM | POA: Diagnosis not present

## 2022-02-20 DIAGNOSIS — Z7151 Drug abuse counseling and surveillance of drug abuser: Secondary | ICD-10-CM | POA: Diagnosis not present

## 2022-02-21 DIAGNOSIS — F1120 Opioid dependence, uncomplicated: Secondary | ICD-10-CM | POA: Diagnosis not present

## 2022-02-21 DIAGNOSIS — Z7151 Drug abuse counseling and surveillance of drug abuser: Secondary | ICD-10-CM | POA: Diagnosis not present

## 2022-02-27 DIAGNOSIS — F1120 Opioid dependence, uncomplicated: Secondary | ICD-10-CM | POA: Diagnosis not present

## 2022-02-27 DIAGNOSIS — Z7151 Drug abuse counseling and surveillance of drug abuser: Secondary | ICD-10-CM | POA: Diagnosis not present

## 2022-03-05 DIAGNOSIS — F1120 Opioid dependence, uncomplicated: Secondary | ICD-10-CM | POA: Diagnosis not present

## 2022-03-05 DIAGNOSIS — Z7151 Drug abuse counseling and surveillance of drug abuser: Secondary | ICD-10-CM | POA: Diagnosis not present

## 2022-03-06 DIAGNOSIS — Z03818 Encounter for observation for suspected exposure to other biological agents ruled out: Secondary | ICD-10-CM | POA: Diagnosis not present

## 2022-03-06 DIAGNOSIS — F1120 Opioid dependence, uncomplicated: Secondary | ICD-10-CM | POA: Diagnosis not present

## 2022-03-06 DIAGNOSIS — Z7151 Drug abuse counseling and surveillance of drug abuser: Secondary | ICD-10-CM | POA: Diagnosis not present
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
## 2022-03-13 DIAGNOSIS — F1120 Opioid dependence, uncomplicated: Secondary | ICD-10-CM | POA: Diagnosis not present

## 2022-03-13 DIAGNOSIS — Z7151 Drug abuse counseling and surveillance of drug abuser: Secondary | ICD-10-CM | POA: Diagnosis not present

## 2022-03-14 DIAGNOSIS — Z7151 Drug abuse counseling and surveillance of drug abuser: Secondary | ICD-10-CM | POA: Diagnosis not present

## 2022-03-14 DIAGNOSIS — F1120 Opioid dependence, uncomplicated: Secondary | ICD-10-CM | POA: Diagnosis not present

## 2022-03-19 DIAGNOSIS — Z7151 Drug abuse counseling and surveillance of drug abuser: Secondary | ICD-10-CM | POA: Diagnosis not present

## 2022-03-19 DIAGNOSIS — F1120 Opioid dependence, uncomplicated: Secondary | ICD-10-CM | POA: Diagnosis not present

## 2022-03-20 DIAGNOSIS — Z7151 Drug abuse counseling and surveillance of drug abuser: Secondary | ICD-10-CM | POA: Diagnosis not present

## 2022-03-20 DIAGNOSIS — F1120 Opioid dependence, uncomplicated: Secondary | ICD-10-CM | POA: Diagnosis not present

## 2022-03-27 DIAGNOSIS — Z7151 Drug abuse counseling and surveillance of drug abuser: Secondary | ICD-10-CM | POA: Diagnosis not present

## 2022-03-27 DIAGNOSIS — F1120 Opioid dependence, uncomplicated: Secondary | ICD-10-CM | POA: Diagnosis not present

## 2022-04-02 DIAGNOSIS — Z7151 Drug abuse counseling and surveillance of drug abuser: Secondary | ICD-10-CM | POA: Diagnosis not present

## 2022-04-02 DIAGNOSIS — F1120 Opioid dependence, uncomplicated: Secondary | ICD-10-CM | POA: Diagnosis not present

## 2022-04-03 DIAGNOSIS — Z03818 Encounter for observation for suspected exposure to other biological agents ruled out: Secondary | ICD-10-CM | POA: Diagnosis not present

## 2022-04-03 DIAGNOSIS — F1120 Opioid dependence, uncomplicated: Secondary | ICD-10-CM | POA: Diagnosis not present

## 2022-04-09 DIAGNOSIS — F1120 Opioid dependence, uncomplicated: Secondary | ICD-10-CM | POA: Diagnosis not present

## 2022-04-09 DIAGNOSIS — Z7151 Drug abuse counseling and surveillance of drug abuser: Secondary | ICD-10-CM | POA: Diagnosis not present

## 2022-04-10 DIAGNOSIS — Z7151 Drug abuse counseling and surveillance of drug abuser: Secondary | ICD-10-CM | POA: Diagnosis not present

## 2022-04-10 DIAGNOSIS — F1120 Opioid dependence, uncomplicated: Secondary | ICD-10-CM | POA: Diagnosis not present

## 2022-04-16 DIAGNOSIS — F1120 Opioid dependence, uncomplicated: Secondary | ICD-10-CM | POA: Diagnosis not present

## 2022-04-16 DIAGNOSIS — Z7151 Drug abuse counseling and surveillance of drug abuser: Secondary | ICD-10-CM | POA: Diagnosis not present

## 2022-04-17 DIAGNOSIS — Z03818 Encounter for observation for suspected exposure to other biological agents ruled out: Secondary | ICD-10-CM | POA: Diagnosis not present

## 2022-04-17 DIAGNOSIS — F1120 Opioid dependence, uncomplicated: Secondary | ICD-10-CM | POA: Diagnosis not present

## 2022-04-21 DIAGNOSIS — Z03818 Encounter for observation for suspected exposure to other biological agents ruled out: Secondary | ICD-10-CM | POA: Diagnosis not present

## 2022-04-21 DIAGNOSIS — F1120 Opioid dependence, uncomplicated: Secondary | ICD-10-CM | POA: Diagnosis not present

## 2022-04-23 DIAGNOSIS — Z7151 Drug abuse counseling and surveillance of drug abuser: Secondary | ICD-10-CM | POA: Diagnosis not present

## 2022-04-23 DIAGNOSIS — F1120 Opioid dependence, uncomplicated: Secondary | ICD-10-CM | POA: Diagnosis not present

## 2022-04-29 DIAGNOSIS — F1120 Opioid dependence, uncomplicated: Secondary | ICD-10-CM | POA: Diagnosis not present

## 2022-04-29 DIAGNOSIS — Z7151 Drug abuse counseling and surveillance of drug abuser: Secondary | ICD-10-CM | POA: Diagnosis not present

## 2022-04-30 DIAGNOSIS — F1120 Opioid dependence, uncomplicated: Secondary | ICD-10-CM | POA: Diagnosis not present

## 2022-04-30 DIAGNOSIS — Z7151 Drug abuse counseling and surveillance of drug abuser: Secondary | ICD-10-CM | POA: Diagnosis not present

## 2022-05-01 DIAGNOSIS — Z03818 Encounter for observation for suspected exposure to other biological agents ruled out: Secondary | ICD-10-CM | POA: Diagnosis not present

## 2022-05-01 DIAGNOSIS — F1120 Opioid dependence, uncomplicated: Secondary | ICD-10-CM | POA: Diagnosis not present

## 2022-05-07 DIAGNOSIS — Z7151 Drug abuse counseling and surveillance of drug abuser: Secondary | ICD-10-CM | POA: Diagnosis not present

## 2022-05-07 DIAGNOSIS — F1120 Opioid dependence, uncomplicated: Secondary | ICD-10-CM | POA: Diagnosis not present

## 2022-05-13 DIAGNOSIS — F1120 Opioid dependence, uncomplicated: Secondary | ICD-10-CM | POA: Diagnosis not present

## 2022-05-13 DIAGNOSIS — Z7151 Drug abuse counseling and surveillance of drug abuser: Secondary | ICD-10-CM | POA: Diagnosis not present

## 2022-05-14 DIAGNOSIS — Z7151 Drug abuse counseling and surveillance of drug abuser: Secondary | ICD-10-CM | POA: Diagnosis not present

## 2022-05-14 DIAGNOSIS — F1120 Opioid dependence, uncomplicated: Secondary | ICD-10-CM | POA: Diagnosis not present

## 2022-05-16 DIAGNOSIS — Z7151 Drug abuse counseling and surveillance of drug abuser: Secondary | ICD-10-CM | POA: Diagnosis not present

## 2022-05-16 DIAGNOSIS — F1120 Opioid dependence, uncomplicated: Secondary | ICD-10-CM | POA: Diagnosis not present

## 2022-05-21 DIAGNOSIS — Z7151 Drug abuse counseling and surveillance of drug abuser: Secondary | ICD-10-CM | POA: Diagnosis not present

## 2022-05-21 DIAGNOSIS — F1120 Opioid dependence, uncomplicated: Secondary | ICD-10-CM | POA: Diagnosis not present

## 2022-05-29 DIAGNOSIS — F1020 Alcohol dependence, uncomplicated: Secondary | ICD-10-CM | POA: Diagnosis not present

## 2022-05-29 DIAGNOSIS — F1410 Cocaine abuse, uncomplicated: Secondary | ICD-10-CM | POA: Diagnosis not present

## 2022-05-29 DIAGNOSIS — Z13818 Encounter for screening for other digestive system disorders: Secondary | ICD-10-CM | POA: Diagnosis not present

## 2022-05-29 DIAGNOSIS — F1120 Opioid dependence, uncomplicated: Secondary | ICD-10-CM | POA: Diagnosis not present

## 2022-05-29 DIAGNOSIS — Z7151 Drug abuse counseling and surveillance of drug abuser: Secondary | ICD-10-CM | POA: Diagnosis not present

## 2022-05-30 DIAGNOSIS — F1120 Opioid dependence, uncomplicated: Secondary | ICD-10-CM | POA: Diagnosis not present

## 2022-05-30 DIAGNOSIS — Z7151 Drug abuse counseling and surveillance of drug abuser: Secondary | ICD-10-CM | POA: Diagnosis not present

## 2022-06-03 ENCOUNTER — Encounter: Payer: Self-pay | Admitting: Cardiology

## 2022-06-03 ENCOUNTER — Ambulatory Visit (INDEPENDENT_AMBULATORY_CARE_PROVIDER_SITE_OTHER): Payer: BC Managed Care – PPO | Admitting: Cardiology

## 2022-06-03 VITALS — BP 130/84 | HR 76 | Ht 70.0 in | Wt 231.4 lb

## 2022-06-03 DIAGNOSIS — Z7151 Drug abuse counseling and surveillance of drug abuser: Secondary | ICD-10-CM | POA: Diagnosis not present

## 2022-06-03 DIAGNOSIS — R0789 Other chest pain: Secondary | ICD-10-CM

## 2022-06-03 DIAGNOSIS — R002 Palpitations: Secondary | ICD-10-CM

## 2022-06-03 DIAGNOSIS — F1020 Alcohol dependence, uncomplicated: Secondary | ICD-10-CM | POA: Diagnosis not present

## 2022-06-03 DIAGNOSIS — F1410 Cocaine abuse, uncomplicated: Secondary | ICD-10-CM | POA: Diagnosis not present

## 2022-06-03 DIAGNOSIS — G4730 Sleep apnea, unspecified: Secondary | ICD-10-CM | POA: Diagnosis not present

## 2022-06-03 DIAGNOSIS — F1120 Opioid dependence, uncomplicated: Secondary | ICD-10-CM | POA: Diagnosis not present

## 2022-06-03 NOTE — Patient Instructions (Signed)
Medication Instructions:  Your physician recommends that you continue on your current medications as directed. Please refer to the Current Medication list given to you today.   Labwork: TSH, Free T4  Testing/Procedures: none  Follow-Up: Your physician recommends that you schedule a follow-up appointment in: 6 months  Any Other Special Instructions Will Be Listed Below (If Applicable).  You have been referred to [facility].  If you need a refill on your cardiac medications before your next appointment, please call your pharmacy.

## 2022-06-03 NOTE — Progress Notes (Signed)
Clinical Summary John [name] is a 46 y.o.male seen today for follow up of the following medical problems.    1.Chest pain -10/13/2021 ER visit with chest pain - trops neg x 2, EKG NSR, CXR possible bronchitis   - tends to happen in early AM. Got up to use the bathroom. Sharp pain left chest, +palpitations, +SOB, dizzy. Tried to wait out symptoms but they progressed, went to ER.  - intermittent sharp pain over 2-3 hours, palpitations lasted 2-3 hours. 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 No exertional symptoms     11/2021 monitor: SR, wenchebach block, rare PACs/PVCs - one episode since last visit, mild few months ago - has cut out caffeine, limiting caffeine   TSH 7.1  +apneic episodes, +hypersomnolence      SH: has 7 children Works doing road striping.   Past Medical History:  Diagnosis Date   Anxiety    Bronchitis    Depression    Opiate addiction (HCC)    Panic attacks    Pneumonia    Testicular torsion      Allergies  Allergen Reactions   No Known Allergies    Other     No narcotics please     Current Outpatient Medications  Medication Sig Dispense Refill   budesonide-formoterol (SYMBICORT) 160-4.5 MCG/ACT inhaler Inhale 2 puffs into the lungs 2 (two) times daily. Rinse mouth well with water after each use 1 each 0   Buprenorphine HCl-Naloxone HCl 12-3 MG FILM Place 1 strip under the tongue 2 (two) times daily.     predniSONE (STERAPRED UNI-PAK 21 TAB) 10 MG (21) TBPK tablet Take by mouth daily. Take 6 tabs by mouth daily  for 2 days, then 5 tabs for 2 days, then 4 tabs for 2 days, then 3 tabs for 2 days, 2 tabs for 2 days, then 1 tab by mouth daily for 2 days 42 tablet 0   No current facility-administered medications for this visit.     Past Surgical History:  Procedure Laterality Date   APPENDECTOMY     EYE  SURGERY     NASAL SINUS SURGERY     VASECTOMY       Allergies  Allergen Reactions   No Known Allergies    Other     No narcotics please      Family History  Problem Relation Age of Onset   Diabetes Mother    Hypertension Father    Cancer Other      Social History John [name] reports that he has been smoking cigarettes. He has been smoking an average of 2 packs per day. He has never used smokeless tobacco. John [name] reports no history of alcohol use.   Review of Systems CONSTITUTIONAL: No weight loss, fever, chills, weakness or fatigue.  HEENT: Eyes: No visual loss, blurred vision, double vision or yellow sclerae.No hearing loss, sneezing, congestion, runny nose or sore throat.  SKIN: No rash or itching.  CARDIOVASCULAR: per hpi RESPIRATORY: No shortness of breath, cough or sputum.  GASTROINTESTINAL: No anorexia, nausea, vomiting or diarrhea. No abdominal pain or blood.  GENITOURINARY: No burning on urination, no polyuria NEUROLOGICAL: No headache, dizziness, syncope, paralysis, ataxia, numbness or tingling in the extremities. No change in bowel or bladder control.  MUSCULOSKELETAL: No muscle, back pain, joint pain  or stiffness.  LYMPHATICS: No enlarged nodes. No history of splenectomy.  PSYCHIATRIC: No history of depression or anxiety.  ENDOCRINOLOGIC: No reports of sweating, cold or heat intolerance. No polyuria or polydipsia.  Marland Kitchen   Physical Examination Today's Vitals   06/03/22 1542  BP: 130/84  Pulse: 76  SpO2: 97%  Weight: 231 lb 6.4 oz (105 kg)  Height: 5\' 10"  (1.778 m)   Body mass index is 33.2 kg/m.  Gen: resting comfortably, no acute distress HEENT: no scleral icterus, pupils equal round and reactive, no palptable cervical adenopathy,  CV: RRR, no m/r/g no jvd Resp: Clear to auscultation bilaterally GI: abdomen is soft, non-tender, non-distended, normal bowel sounds, no hepatosplenomegaly MSK: extremities are warm, no edema.  Skin: warm, no  rash Neuro:  no focal deficits Psych: appropriate affect   Diagnostic Studies  11/2021 monitor Predominant underlying rhythm was sinus rhythm. First Degree AV Block was present. Second Degree AV Block-Mobitz I (Wenckebach) was present. Wenckebach was detected within +/- 45 seconds of symptomatic patient events. No higher degree AV Block noted.  Rare PACs and PVCs.  No atrial fibrillation.   Assessment and Plan  1.Chest pain/palpitations - monitor without tachyarrhthmis. Did have some bradycardia and wenchebach primarily at night, though occasoinal mild episodes during the day - high TSH last year, repeat thyroid panel - signs and symptoms of OSA, refer to pulmonary - he is not on any av nodal agents.    2. OSA screen - signs and symptoms of OSA. Middle aged male BMI 75 with self noted apneic episodes, +daytime somnolence - refer to pulmonary.       32, M.D.,

## 2022-06-04 DIAGNOSIS — Z7151 Drug abuse counseling and surveillance of drug abuser: Secondary | ICD-10-CM | POA: Diagnosis not present

## 2022-06-04 DIAGNOSIS — F1020 Alcohol dependence, uncomplicated: Secondary | ICD-10-CM | POA: Diagnosis not present

## 2022-06-04 DIAGNOSIS — F1120 Opioid dependence, uncomplicated: Secondary | ICD-10-CM | POA: Diagnosis not present

## 2022-06-04 DIAGNOSIS — F1410 Cocaine abuse, uncomplicated: Secondary | ICD-10-CM | POA: Diagnosis not present

## 2022-06-06 DIAGNOSIS — F1120 Opioid dependence, uncomplicated: Secondary | ICD-10-CM | POA: Diagnosis not present

## 2022-06-06 DIAGNOSIS — Z7151 Drug abuse counseling and surveillance of drug abuser: Secondary | ICD-10-CM | POA: Diagnosis not present

## 2022-06-18 DIAGNOSIS — Z7151 Drug abuse counseling and surveillance of drug abuser: Secondary | ICD-10-CM | POA: Diagnosis not present

## 2022-06-18 DIAGNOSIS — F1120 Opioid dependence, uncomplicated: Secondary | ICD-10-CM | POA: Diagnosis not present

## 2022-06-24 DIAGNOSIS — F1120 Opioid dependence, uncomplicated: Secondary | ICD-10-CM | POA: Diagnosis not present

## 2022-06-24 DIAGNOSIS — Z7151 Drug abuse counseling and surveillance of drug abuser: Secondary | ICD-10-CM | POA: Diagnosis not present

## 2022-06-25 DIAGNOSIS — F1120 Opioid dependence, uncomplicated: Secondary | ICD-10-CM | POA: Diagnosis not present

## 2022-06-25 DIAGNOSIS — Z7151 Drug abuse counseling and surveillance of drug abuser: Secondary | ICD-10-CM | POA: Diagnosis not present

## 2022-06-26 DIAGNOSIS — Z7151 Drug abuse counseling and surveillance of drug abuser: Secondary | ICD-10-CM | POA: Diagnosis not present

## 2022-06-26 DIAGNOSIS — F1120 Opioid dependence, uncomplicated: Secondary | ICD-10-CM | POA: Diagnosis not present

## 2022-07-03 DIAGNOSIS — F1120 Opioid dependence, uncomplicated: Secondary | ICD-10-CM | POA: Diagnosis not present

## 2022-07-03 DIAGNOSIS — Z7151 Drug abuse counseling and surveillance of drug abuser: Secondary | ICD-10-CM | POA: Diagnosis not present

## 2022-07-08 DIAGNOSIS — F1120 Opioid dependence, uncomplicated: Secondary | ICD-10-CM | POA: Diagnosis not present

## 2022-07-09 DIAGNOSIS — F1120 Opioid dependence, uncomplicated: Secondary | ICD-10-CM | POA: Diagnosis not present

## 2022-07-09 DIAGNOSIS — Z7151 Drug abuse counseling and surveillance of drug abuser: Secondary | ICD-10-CM | POA: Diagnosis not present

## 2022-07-12 DIAGNOSIS — Z7151 Drug abuse counseling and surveillance of drug abuser: Secondary | ICD-10-CM | POA: Diagnosis not present

## 2022-07-12 DIAGNOSIS — F1120 Opioid dependence, uncomplicated: Secondary | ICD-10-CM | POA: Diagnosis not present

## 2022-07-16 DIAGNOSIS — F1120 Opioid dependence, uncomplicated: Secondary | ICD-10-CM | POA: Diagnosis not present

## 2022-07-16 DIAGNOSIS — Z7151 Drug abuse counseling and surveillance of drug abuser: Secondary | ICD-10-CM | POA: Diagnosis not present

## 2022-07-17 DIAGNOSIS — F1120 Opioid dependence, uncomplicated: Secondary | ICD-10-CM | POA: Diagnosis not present

## 2022-07-22 DIAGNOSIS — F1120 Opioid dependence, uncomplicated: Secondary | ICD-10-CM | POA: Diagnosis not present

## 2022-07-22 DIAGNOSIS — Z7151 Drug abuse counseling and surveillance of drug abuser: Secondary | ICD-10-CM | POA: Diagnosis not present

## 2022-07-23 DIAGNOSIS — F1120 Opioid dependence, uncomplicated: Secondary | ICD-10-CM | POA: Diagnosis not present

## 2022-07-24 DIAGNOSIS — Z03818 Encounter for observation for suspected exposure to other biological agents ruled out: Secondary | ICD-10-CM | POA: Diagnosis not present

## 2022-07-24 DIAGNOSIS — F1120 Opioid dependence, uncomplicated: Secondary | ICD-10-CM | POA: Diagnosis not present

## 2022-07-28 DIAGNOSIS — F1120 Opioid dependence, uncomplicated: Secondary | ICD-10-CM | POA: Diagnosis not present

## 2022-07-28 DIAGNOSIS — Z7151 Drug abuse counseling and surveillance of drug abuser: Secondary | ICD-10-CM | POA: Diagnosis not present

## 2022-08-06 DIAGNOSIS — Z7151 Drug abuse counseling and surveillance of drug abuser: Secondary | ICD-10-CM | POA: Diagnosis not present

## 2022-08-06 DIAGNOSIS — F1120 Opioid dependence, uncomplicated: Secondary | ICD-10-CM | POA: Diagnosis not present

## 2022-08-13 DIAGNOSIS — F1120 Opioid dependence, uncomplicated: Secondary | ICD-10-CM | POA: Diagnosis not present

## 2022-08-13 DIAGNOSIS — Z7151 Drug abuse counseling and surveillance of drug abuser: Secondary | ICD-10-CM | POA: Diagnosis not present

## 2022-08-20 DIAGNOSIS — Z7151 Drug abuse counseling and surveillance of drug abuser: Secondary | ICD-10-CM | POA: Diagnosis not present

## 2022-08-20 DIAGNOSIS — F1120 Opioid dependence, uncomplicated: Secondary | ICD-10-CM | POA: Diagnosis not present

## 2022-08-21 DIAGNOSIS — F1120 Opioid dependence, uncomplicated: Secondary | ICD-10-CM | POA: Diagnosis not present

## 2022-08-21 DIAGNOSIS — Z03818 Encounter for observation for suspected exposure to other biological agents ruled out: Secondary | ICD-10-CM | POA: Diagnosis not present

## 2022-08-21 DIAGNOSIS — Z7151 Drug abuse counseling and surveillance of drug abuser: Secondary | ICD-10-CM | POA: Diagnosis not present

## 2022-08-27 DIAGNOSIS — Z7151 Drug abuse counseling and surveillance of drug abuser: Secondary | ICD-10-CM | POA: Diagnosis not present

## 2022-08-27 DIAGNOSIS — F1120 Opioid dependence, uncomplicated: Secondary | ICD-10-CM | POA: Diagnosis not present

## 2022-09-03 DIAGNOSIS — Z7151 Drug abuse counseling and surveillance of drug abuser: Secondary | ICD-10-CM | POA: Diagnosis not present

## 2022-09-03 DIAGNOSIS — F1120 Opioid dependence, uncomplicated: Secondary | ICD-10-CM | POA: Diagnosis not present

## 2022-09-04 DIAGNOSIS — F1120 Opioid dependence, uncomplicated: Secondary | ICD-10-CM | POA: Diagnosis not present

## 2022-09-04 DIAGNOSIS — Z7151 Drug abuse counseling and surveillance of drug abuser: Secondary | ICD-10-CM | POA: Diagnosis not present

## 2022-09-09 DIAGNOSIS — F1120 Opioid dependence, uncomplicated: Secondary | ICD-10-CM | POA: Diagnosis not present

## 2022-09-09 DIAGNOSIS — Z7151 Drug abuse counseling and surveillance of drug abuser: Secondary | ICD-10-CM | POA: Diagnosis not present

## 2022-09-16 DIAGNOSIS — F1120 Opioid dependence, uncomplicated: Secondary | ICD-10-CM | POA: Diagnosis not present

## 2022-09-16 DIAGNOSIS — Z7151 Drug abuse counseling and surveillance of drug abuser: Secondary | ICD-10-CM | POA: Diagnosis not present

## 2022-09-17 ENCOUNTER — Encounter (HOSPITAL_COMMUNITY): Payer: Self-pay | Admitting: Emergency Medicine

## 2022-09-17 ENCOUNTER — Emergency Department (HOSPITAL_COMMUNITY)
Admission: EM | Admit: 2022-09-17 | Discharge: 2022-09-17 | Disposition: A | Discharge status: Home / Self Care | Payer: BC Managed Care – PPO | Attending: Emergency Medicine | Admitting: Emergency Medicine

## 2022-09-17 ENCOUNTER — Emergency Department (HOSPITAL_COMMUNITY): Payer: BC Managed Care – PPO

## 2022-09-17 ENCOUNTER — Other Ambulatory Visit: Payer: Self-pay

## 2022-09-17 DIAGNOSIS — Z7151 Drug abuse counseling and surveillance of drug abuser: Secondary | ICD-10-CM | POA: Diagnosis not present

## 2022-09-17 DIAGNOSIS — R059 Cough, unspecified: Secondary | ICD-10-CM | POA: Diagnosis not present

## 2022-09-17 DIAGNOSIS — R0602 Shortness of breath: Secondary | ICD-10-CM | POA: Diagnosis not present

## 2022-09-17 DIAGNOSIS — F1120 Opioid dependence, uncomplicated: Secondary | ICD-10-CM | POA: Diagnosis not present

## 2022-09-17 DIAGNOSIS — J441 Chronic obstructive pulmonary disease with (acute) exacerbation: Secondary | ICD-10-CM | POA: Diagnosis not present

## 2022-09-17 MED ORDER — AZITHROMYCIN 250 MG PO TABS
500.0000 mg | ORAL_TABLET | Freq: Once | ORAL | Status: AC
  Administered 2022-09-17: 500 mg via ORAL
  Filled 2022-09-17: qty 2

## 2022-09-17 MED ORDER — ALBUTEROL SULFATE HFA 108 (90 BASE) MCG/ACT IN AERS
2.0000 | INHALATION_SPRAY | RESPIRATORY_TRACT | Status: DC | PRN
  Administered 2022-09-17: 2 via RESPIRATORY_TRACT
  Filled 2022-09-17: qty 6.7

## 2022-09-17 MED ORDER — PREDNISONE 10 MG PO TABS: ORAL_TABLET | ORAL | 0 refills | Status: DC

## 2022-09-17 MED ORDER — PREDNISONE 50 MG PO TABS
60.0000 mg | ORAL_TABLET | Freq: Once | ORAL | Status: AC
  Administered 2022-09-17: 60 mg via ORAL
  Filled 2022-09-17: qty 1

## 2022-09-17 MED ORDER — AZITHROMYCIN 250 MG PO TABS: 250.0000 mg | ORAL_TABLET | Freq: Every day | ORAL | 0 refills | Status: DC

## 2022-09-17 NOTE — ED Triage Notes (Signed)
Pt states he "has bronchitis again". States he "did a breathing treatment about 63mins ago" and "is still struggling for air".

## 2022-09-17 NOTE — ED Notes (Signed)
Pt ambulated to restroom with steady gait. Analeise Mccleery Donavyn Fecher,RN 

## 2022-09-17 NOTE — Discharge Instructions (Addendum)
Take your next dose of the Zithromax and prednisone tomorrow evening.  Continue using your albuterol, either the nebulizer or your inhaler every 4 hours if needed for wheezing or shortness of breath.  Get rechecked immediately for any new or worsening symptoms with your breathing.  Your chest x-ray is clear with no signs of pneumonia but you have definite changes associated with smoking.

## 2022-09-18 DIAGNOSIS — F1120 Opioid dependence, uncomplicated: Secondary | ICD-10-CM | POA: Diagnosis not present

## 2022-09-18 DIAGNOSIS — Z03818 Encounter for observation for suspected exposure to other biological agents ruled out: Secondary | ICD-10-CM | POA: Diagnosis not present

## 2022-09-20 NOTE — ED Provider Notes (Signed)
[facility] EMERGENCY DEPARTMENT Provider Note   CSN: [id number] Arrival date & time: 09/17/22  1946     History  Chief Complaint  Patient presents with   Shortness of Breath    John J Swaziland Jr. is a 46 y.o. male.  The history is provided by the patient.  Shortness of Breath Severity:  Moderate Onset quality:  Gradual Duration:  1 day Timing:  Intermittent Progression:  Waxing and waning Chronicity:  Recurrent (Patient reports he gets bronchitis about twice a year, reports wheezing and cough for the past 2 days.) Relieved by: Transient improvement with his albuterol nebulizer, last treatment was about 45 minutes prior to arrival here. 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  He typically gets symptoms twice a year with seasonal change.) Ineffective treatments:  None tried Associated symptoms: cough, sputum production and wheezing   Associated symptoms: no chest pain, no diaphoresis, no fever, no hemoptysis, no neck pain, no PND, no swollen glands and no vomiting   Patient is a smoker.     Home Medications Prior to Admission medications   Medication Sig Start Date End Date Taking? Authorizing Provider  albuterol (VENTOLIN HFA) 108 (90 Base) MCG/ACT inhaler Inhale 2 puffs into the lungs 4 (four) times daily as needed. 02/10/22  Yes [provider]  azithromycin (ZITHROMAX) 250 MG tablet Take 1 tablet (250 mg total) by mouth daily. Take first 2 tablets together, then 1 every day until finished. 09/17/22  Yes Gunther Zawadzki, Raynelle Fanning, PA-C  Buprenorphine HCl-Naloxone HCl 12-3 MG FILM Place 1 strip under the tongue 2 (two) times daily. 11/15/21  Yes [provider]  ipratropium-albuterol (DUONEB) 0.5-2.5 (3) MG/3ML SOLN Take 3 mLs by nebulization every 6 (six) hours as needed (shortness of breath).   Yes [provider]  predniSONE (DELTASONE) 10 MG tablet 6, 5, 4, 3, 2 then 1 tablet by mouth  daily for 6 days total. 09/17/22  Yes Shamari Lofquist, Raynelle Fanning, PA-C      Allergies    No known allergies and Other    Review of Systems   Review of Systems  Constitutional:  Negative for diaphoresis and fever.  Respiratory:  Positive for cough, sputum production, shortness of breath and wheezing. Negative for hemoptysis.   Cardiovascular:  Negative for chest pain and PND.  Gastrointestinal:  Negative for vomiting.  Musculoskeletal:  Negative for neck pain.    Physical Exam Updated Vital Signs BP 128/78   Pulse 90   Temp 98.9 F (37.2 C) (Oral)   Resp 20   Ht 5\' 10"  (1.778 m)   Wt 102.1 kg   SpO2 97%   BMI 32.28 kg/m  Physical Exam Vitals and nursing note reviewed.  Constitutional:      Appearance: He is well-developed.  HENT:     Head: Normocephalic and atraumatic.  Eyes:     Conjunctiva/sclera: Conjunctivae normal.  Cardiovascular:     Rate and Rhythm: Normal rate and regular rhythm.     Heart sounds: Normal heart sounds.  Pulmonary:     Effort: Pulmonary effort is normal.     Breath sounds: Examination of the right-lower field reveals wheezing. Examination of the left-lower field reveals wheezing. Wheezing present.     Comments: Minimal expiratory wheeze bilateral bases, all of the lung fields are essentially clear with appropriate aeration.  No prolonged expirations are present.  Of note, patient was examined after he received an albuterol MDI. Abdominal:  General: Bowel sounds are normal.     Palpations: Abdomen is soft.     Tenderness: There is no abdominal tenderness.  Musculoskeletal:        General: Normal range of motion.     Cervical back: Normal range of motion.  Skin:    General: Skin is warm and dry.  Neurological:     Mental Status: He is alert.     ED Results / Procedures / Treatments   Labs (all labs ordered are listed, but only abnormal results are displayed) Labs Reviewed - No data to display  EKG None  Radiology No results  found.  Procedures Procedures    Medications Ordered in ED Medications  predniSONE (DELTASONE) tablet 60 mg (60 mg Oral Given 09/17/22 2129)  azithromycin (ZITHROMAX) tablet 500 mg (500 mg Oral Given 09/17/22 2129)    ED Course/ Medical Decision Making/ A&P                           Medical Decision Making Patient with a smoking history presenting with wheezing and shortness of breath, states he gets bronchitis about twice a year with seasonal changes.  He is in no respiratory distress here and his chest x-ray is clear with no sign of pneumonia.  However given his smoking history I will cover him with antibiotics.  Patient states that a course of Zithromax and a prednisone taper usually gets him on the right track within just a couple of days, this seems reasonable.  He was advised close follow-up for any worsening or persistent symptoms.  He was stable at time of discharge.  Amount and/or Complexity of Data Reviewed Radiology: ordered.    Details: Chest x-ray reviewed, I agree with interpretation.  Risk Prescription drug management.           Final Clinical Impression(s) / ED Diagnoses Final diagnoses:  COPD exacerbation ([place])    Rx / DC Orders ED Discharge Orders          Ordered    azithromycin (ZITHROMAX) 250 MG tablet  Daily        09/17/22 2125    predniSONE (DELTASONE) 10 MG tablet        09/17/22 2125              Evalee Jefferson, PA-C 09/20/22 1437    Noemi Chapel, MD 09/30/22 1323

## 2022-09-24 DIAGNOSIS — F1120 Opioid dependence, uncomplicated: Secondary | ICD-10-CM | POA: Diagnosis not present

## 2022-09-24 DIAGNOSIS — I10 Essential (primary) hypertension: Secondary | ICD-10-CM | POA: Diagnosis not present

## 2022-09-24 DIAGNOSIS — Z7151 Drug abuse counseling and surveillance of drug abuser: Secondary | ICD-10-CM | POA: Diagnosis not present

## 2022-09-24 DIAGNOSIS — J209 Acute bronchitis, unspecified: Secondary | ICD-10-CM | POA: Diagnosis not present

## 2022-10-01 DIAGNOSIS — F1120 Opioid dependence, uncomplicated: Secondary | ICD-10-CM | POA: Diagnosis not present

## 2022-10-01 DIAGNOSIS — Z7151 Drug abuse counseling and surveillance of drug abuser: Secondary | ICD-10-CM | POA: Diagnosis not present

## 2022-10-02 ENCOUNTER — Emergency Department (HOSPITAL_COMMUNITY): Payer: BC Managed Care – PPO

## 2022-10-02 ENCOUNTER — Other Ambulatory Visit: Payer: Self-pay

## 2022-10-02 ENCOUNTER — Encounter (HOSPITAL_COMMUNITY): Payer: Self-pay

## 2022-10-02 ENCOUNTER — Emergency Department (HOSPITAL_COMMUNITY)
Admission: EM | Admit: 2022-10-02 | Discharge: 2022-10-02 | Disposition: A | Discharge status: Home / Self Care | Payer: BC Managed Care – PPO | Attending: Emergency Medicine | Admitting: Emergency Medicine

## 2022-10-02 DIAGNOSIS — R079 Chest pain, unspecified: Secondary | ICD-10-CM | POA: Diagnosis not present

## 2022-10-02 DIAGNOSIS — J449 Chronic obstructive pulmonary disease, unspecified: Secondary | ICD-10-CM | POA: Insufficient documentation

## 2022-10-02 DIAGNOSIS — R202 Paresthesia of skin: Secondary | ICD-10-CM | POA: Insufficient documentation

## 2022-10-02 DIAGNOSIS — R42 Dizziness and giddiness: Secondary | ICD-10-CM | POA: Diagnosis not present

## 2022-10-02 DIAGNOSIS — R0789 Other chest pain: Secondary | ICD-10-CM | POA: Diagnosis not present

## 2022-10-02 DIAGNOSIS — Z7951 Long term (current) use of inhaled steroids: Secondary | ICD-10-CM | POA: Diagnosis not present

## 2022-10-02 DIAGNOSIS — R200 Anesthesia of skin: Secondary | ICD-10-CM | POA: Diagnosis not present

## 2022-10-02 LAB — BASIC METABOLIC PANEL
Anion gap: 7 (ref 5–15)
BUN: 16 mg/dL (ref 6–20)
CO2: 28 mmol/L (ref 22–32)
Calcium: 8.9 mg/dL (ref 8.9–10.3)
Chloride: 102 mmol/L (ref 98–111)
Creatinine, Ser: 0.89 mg/dL (ref 0.61–1.24)
GFR, Estimated: >60 mL/min (ref >60)
Glucose, Bld: 137 mg/dL — ABNORMAL HIGH (ref 70–99)
Potassium: 4.1 mmol/L (ref 3.5–5.1)
Sodium: 137 mmol/L (ref 135–145)

## 2022-10-02 LAB — CBC
HCT: 45.9 % (ref 39.0–52.0)
Hemoglobin: 15.7 g/dL (ref 13.0–17.0)
MCH: 30.5 pg (ref 26.0–34.0)
MCHC: 34.2 g/dL (ref 30.0–36.0)
MCV: 89.1 fL (ref 80.0–100.0)
Platelets: 241 10*3/uL (ref 150–400)
RBC: 5.15 MIL/uL (ref 4.22–5.81)
RDW: 13.1 % (ref 11.5–15.5)
WBC: 10.9 10*3/uL — ABNORMAL HIGH (ref 4.0–10.5)
nRBC: 0 % (ref 0.0–0.2)

## 2022-10-02 LAB — CBG MONITORING, ED: Glucose-Capillary: 128 mg/dL — ABNORMAL HIGH (ref 70–99)

## 2022-10-02 LAB — TROPONIN I (HIGH SENSITIVITY)
Troponin I (High Sensitivity): 3 ng/L (ref <18)
Troponin I (High Sensitivity): 3 ng/L (ref <18)

## 2022-10-02 MED ORDER — LORAZEPAM 1 MG PO TABS
1.0000 mg | ORAL_TABLET | Freq: Once | ORAL | Status: DC | PRN
  Filled 2022-10-02: qty 1

## 2022-10-02 MED ORDER — ASPIRIN 81 MG PO CHEW
324.0000 mg | CHEWABLE_TABLET | Freq: Once | ORAL | Status: DC
Start: 2022-10-02 — End: 2022-10-02

## 2022-10-02 NOTE — ED Notes (Signed)
Patient transported to MRI 

## 2022-10-02 NOTE — ED Provider Notes (Signed)
[facility] EMERGENCY DEPARTMENT Provider Note   CSN: [id number] Arrival date & time: 10/02/22  8828     History  Chief Complaint  Patient presents with   Chest Pain    John Fuller Swazil[place] Jr. is a 46 y.o. male.   Chest Pain    Patient has a history of panic attacks, COPD who presents to the ED for evaluation of an episode of chest discomfort associated with tingling numbness in his left face and left arm.  Patient states he was driving to work today.  He suddenly started to feel very lightheaded as if he was drunk.  Patient then started to have a sensation of coldness in his left arm.  He also started feeling tingling in his left arm and face.  Patient then started experiencing sharp stabbing pain in his chest.  He came to the ED for further evaluation.  He denies any history of Neri artery disease.  No history of stroke or TIA.  Home Medications Prior to Admission medications   Medication Sig Start Date End Date Taking? Authorizing Provider  albuterol (VENTOLIN HFA) 108 (90 Base) MCG/ACT inhaler Inhale 2 puffs into the lungs 4 (four) times daily as needed. 02/10/22  Yes [provider]  Buprenorphine HCl-Naloxone HCl 12-3 MG FILM Place 1 strip under the tongue 2 (two) times daily. 11/15/21  Yes [provider]  ibuprofen (ADVIL) 200 MG tablet Take 800 mg by mouth 2 (two) times daily as needed. Taken once or twice a day for pain   Yes [provider]  ipratropium-albuterol (DUONEB) 0.5-2.5 (3) MG/3ML SOLN Take 3 mLs by nebulization every 6 (six) hours as needed (shortness of breath).   Yes [provider]  azithromycin (ZITHROMAX) 250 MG tablet Take 1 tablet (250 mg total) by mouth daily. Take first 2 tablets together, then 1 every day until finished. Patient not taking: Reported on 10/02/2022 09/17/22   [name], PA-C  predniSONE (DELTASONE) 10 MG tablet 6, 5, 4, 3, 2 then 1 tablet by mouth daily for 6 days total. Patient not taking: Reported on  10/02/2022 09/17/22   [name], PA-C      Allergies    No known allergies and Other    Review of Systems   Review of Systems  Cardiovascular:  Positive for chest pain.    Physical Exam Updated Vital Signs BP 128/70 (BP Location: Right Arm)   Pulse 60   Temp 97.9 F (36.6 C) (Oral)   Resp 17   Ht 1.778 m (5\' 10" )   Wt 104.3 kg   SpO2 100%   BMI 33.00 kg/m  Physical Exam Vitals and nursing note reviewed.  Constitutional:      General: He is not in acute distress.    Appearance: He is well-developed.  HENT:     Head: Normocephalic and atraumatic.     Right Ear: External ear normal.     Left Ear: External ear normal.  Eyes:     General: No visual field deficit or scleral icterus.       Right eye: No discharge.        Left eye: No discharge.     Conjunctiva/sclera: Conjunctivae normal.  Neck:     Trachea: No tracheal deviation.  Cardiovascular:     Rate and Rhythm: Normal rate and regular rhythm.  Pulmonary:     Effort: Pulmonary effort is normal. No respiratory distress.     Breath sounds: Normal breath sounds. No stridor. No wheezing or  rales.  Abdominal:     General: Bowel sounds are normal. There is no distension.     Palpations: Abdomen is soft.     Tenderness: There is no abdominal tenderness. There is no guarding or rebound.  Musculoskeletal:        General: No tenderness.     Cervical back: Neck supple.  Skin:    General: Skin is warm and dry.     Findings: No rash.  Neurological:     Mental Status: He is alert and oriented to person, place, and time.     Cranial Nerves: No cranial nerve deficit, dysarthria or facial asymmetry.     Sensory: No sensory deficit.     Motor: No abnormal muscle tone, seizure activity or pronator drift.     Coordination: Coordination normal.     Comments:  able to hold both legs off bed for 5 seconds, sensation intact in all extremities,  no left or right sided neglect, normal finger-nose exam bilaterally, no nystagmus  noted   Psychiatric:        Mood and Affect: Mood normal.     ED Results / Procedures / Treatments   Labs (all labs ordered are listed, but only abnormal results are displayed) Labs Reviewed  BASIC METABOLIC PANEL - Abnormal; Notable for the following components:      Result Value   Glucose, Bld 137 (*)    All other components within normal limits  CBC - Abnormal; Notable for the following components:   WBC 10.9 (*)    All other components within normal limits  CBG MONITORING, ED - Abnormal; Notable for the following components:   Glucose-Capillary 128 (*)    All other components within normal limits  TROPONIN I (HIGH SENSITIVITY)  TROPONIN I (HIGH SENSITIVITY)    EKG EKG Interpretation  Date/Time:  Wednesday October 02 2022 09:36:39 EDT Ventricular Rate:  68 PR Interval:  159 QRS Duration: 97 QT Interval:  405 QTC Calculation: 431 R Axis:   9 Text Interpretation: Sinus rhythm No significant change since last tracing Confirmed by Dorie Rank [phone]) on 10/02/2022 9:44:51 AM  Radiology MR BRAIN WO CONTRAST  Result Date: 10/02/2022 CLINICAL DATA:  Left-sided chest tightness radiating into the left neck and down the arm. Dizziness for 2 hours EXAM: MRI HEAD WITHOUT CONTRAST TECHNIQUE: Multiplanar, multiecho pulse sequences of the brain and surrounding structures were obtained without intravenous contrast. COMPARISON:  Head CT from earlier today FINDINGS: Brain: No acute infarction, hemorrhage, hydrocephalus, extra-axial collection or mass lesion. Preserved brain volume. No generalized white matter disease. Vascular: Major arterial flow voids are preserved. Absent upper right IJ flow void usually related to transient extrinsic compression, non worrisome Skull and upper cervical spine: Normal marrow signal. Sinuses/Orbits: Mucosal thickening in the paranasal sinuses, especially left frontal ethmoid and bilateral maxillary. Secretions in the right maxillary sinus. IMPRESSION: 1.  Normal MRI of the brain. 2. Bilateral active sinusitis. Electronically Signed   By: Jorje Guild M.D.   On: 10/02/2022 11:29   CT Head Wo Contrast  Result Date: 10/02/2022 CLINICAL DATA:  Neuro deficit. Stroke suspected. Dizziness for 2 hours EXAM: CT HEAD WITHOUT CONTRAST TECHNIQUE: Contiguous axial images were obtained from the base of the skull through the vertex without intravenous contrast. RADIATION DOSE REDUCTION: This exam was performed according to the departmental dose-optimization program which includes automated exposure control, adjustment of the mA and/or kV according to patient size and/or use of iterative reconstruction technique. COMPARISON:  None Available. FINDINGS: Brain:  There is subtle asymmetric hypodensity in the posterolateral aspect of the right thalamus (series 2, image 15, 14). No evidence of hemorrhage, hydrocephalus, extra-axial collection or mass lesion/mass effect. Vascular: No hyperdense vessel or unexpected calcification. Skull: Normal. Negative for fracture or focal lesion. Sinuses/Orbits: Mucosal thickening bilateral maxillary sinuses with frothy secretions on the right, which can be seen in the setting of acute sinusitis. Mild mucosal thickening is also seen in the left sphenoid sinus. Other: None. IMPRESSION: 1. Subtle asymmetric hypodensity in the posterolateral aspect of the right thalamus could represent an acute infarct. Correlate with symptomatology and consider further evaluation with MRI. 2. Mucosal thickening bilateral maxillary sinuses with frothy secretions on the right, which could be seen in the setting of acute sinusitis. Discussed with Dr. Tomi Bamberger on 10/02/22 at 10:13 AM. Electronically Signed   By: Marin Roberts M.D.   On: 10/02/2022 10:14   DG Chest 1 View  Result Date: 10/02/2022 CLINICAL DATA:  Chest pain. EXAM: CHEST  1 VIEW COMPARISON:  September 17, 2022. FINDINGS: The heart size and mediastinal contours are within normal limits. No acute pulmonary  abnormality is noted. The visualized skeletal structures are unremarkable. IMPRESSION: No active disease. Electronically Signed   By: Marijo Conception M.D.   On: 10/02/2022 10:09    Procedures Procedures    Medications Ordered in ED Medications  aspirin chewable tablet 324 mg (324 mg Oral Not Given 10/02/22 0950)  LORazepam (ATIVAN) tablet 1 mg (has no administration in time range)    ED Course/ Medical Decision Making/ A&P Clinical Course as of 10/02/22 1307  Wed Oct 02, 2022  1011 Case discussed with radiology.  Assymetric density in the right thalamus on head CT.  We will proceed with MRI [JK]  1100 CBC(!) CBC unremarkable [JK]  1100 DG Chest 1 View Chest x-ray without acute findings [JK]  1227 Troponin I (High Sensitivity) Troponin normal [JK]  1227 MR BRAIN WO CONTRAST MRI with out acute abnormalities [JK]    Clinical Course User Index [JK] Dorie Rank, MD           HEART Score: 2                Medical Decision Making Problems Addressed: Chest pain, unspecified type: acute illness or injury that poses a threat to life or bodily functions Paresthesia: acute illness or injury  Amount and/or Complexity of Data Reviewed Labs: ordered. Decision-making details documented in ED Course. Radiology: ordered and independent interpretation performed. Decision-making details documented in ED Course.  Risk OTC drugs. Prescription drug management.   Patient presented to the ED for evaluation of chest pains as well as paresthesia.  ED work-up reassuring.  Low risk heart score with normal serial troponins.  Atypical pain.  I doubt acute coronary syndrome.  Patient also without shortness of breath.  No tachycardia.  No PE risk factors.  I doubt PE, PERC negative  Patient did mention some paresthesias and a sensation of feeling intoxicated.  No focal deficits noted on my exam but was concerned about the possibility of TIA stroke.  CT suggested possible abnormality so MRI was  performed.  Fortunately no signs of any acute abnormality on the MRI.  Etiology of symptoms unclear but no findings to suggest serious etiology that requires admission to the hospital at this time.  Is possible he could have had some type of cardiac dysrhythmia.  I will recommend outpatient referral to cardiology.        Final Clinical Impression(s) /  ED Diagnoses Final diagnoses:  Chest pain, unspecified type  Paresthesia    Rx / DC Orders ED Discharge Orders          Ordered    Ambulatory referral to Cardiology       Comments: If you have not heard from the Cardiology office within the next 72 hours please call [phone].   10/02/22 1304              Dorie Rank, MD 10/02/22 1307

## 2022-10-02 NOTE — Discharge Instructions (Signed)
Follow-up with the cardiologist or your primary care doctor for further evaluation of the episode you today.  The ED work-up was reassuring.  No signs of heart attack.  No findings of stroke on the MRI.

## 2022-10-02 NOTE — ED Triage Notes (Signed)
Pt presents to ED with complaints of left sided chest tightness, radiating into left side of neck and down left arm. Pt states he is also having dizziness started 2 hours ago.

## 2022-10-08 DIAGNOSIS — F17210 Nicotine dependence, cigarettes, uncomplicated: Secondary | ICD-10-CM | POA: Diagnosis not present

## 2022-10-08 DIAGNOSIS — G4733 Obstructive sleep apnea (adult) (pediatric): Secondary | ICD-10-CM | POA: Diagnosis not present

## 2022-10-08 DIAGNOSIS — Z0001 Encounter for general adult medical examination with abnormal findings: Secondary | ICD-10-CM | POA: Diagnosis not present

## 2022-10-08 DIAGNOSIS — Z7151 Drug abuse counseling and surveillance of drug abuser: Secondary | ICD-10-CM | POA: Diagnosis not present

## 2022-10-08 DIAGNOSIS — F17218 Nicotine dependence, cigarettes, with other nicotine-induced disorders: Secondary | ICD-10-CM | POA: Diagnosis not present

## 2022-10-08 DIAGNOSIS — J410 Simple chronic bronchitis: Secondary | ICD-10-CM | POA: Diagnosis not present

## 2022-10-08 DIAGNOSIS — F1120 Opioid dependence, uncomplicated: Secondary | ICD-10-CM | POA: Diagnosis not present

## 2022-10-14 DIAGNOSIS — Z7151 Drug abuse counseling and surveillance of drug abuser: Secondary | ICD-10-CM | POA: Diagnosis not present

## 2022-10-14 DIAGNOSIS — F1120 Opioid dependence, uncomplicated: Secondary | ICD-10-CM | POA: Diagnosis not present

## 2022-10-15 DIAGNOSIS — Z7151 Drug abuse counseling and surveillance of drug abuser: Secondary | ICD-10-CM | POA: Diagnosis not present

## 2022-10-15 DIAGNOSIS — F1120 Opioid dependence, uncomplicated: Secondary | ICD-10-CM | POA: Diagnosis not present

## 2022-10-16 DIAGNOSIS — F1120 Opioid dependence, uncomplicated: Secondary | ICD-10-CM | POA: Diagnosis not present

## 2022-10-16 DIAGNOSIS — Z03818 Encounter for observation for suspected exposure to other biological agents ruled out: Secondary | ICD-10-CM | POA: Diagnosis not present

## 2022-10-22 DIAGNOSIS — F1120 Opioid dependence, uncomplicated: Secondary | ICD-10-CM | POA: Diagnosis not present

## 2022-10-22 DIAGNOSIS — Z7151 Drug abuse counseling and surveillance of drug abuser: Secondary | ICD-10-CM | POA: Diagnosis not present

## 2022-10-28 DIAGNOSIS — Z7151 Drug abuse counseling and surveillance of drug abuser: Secondary | ICD-10-CM | POA: Diagnosis not present

## 2022-10-28 DIAGNOSIS — F1120 Opioid dependence, uncomplicated: Secondary | ICD-10-CM | POA: Diagnosis not present

## 2022-10-29 DIAGNOSIS — Z7151 Drug abuse counseling and surveillance of drug abuser: Secondary | ICD-10-CM | POA: Diagnosis not present

## 2022-10-29 DIAGNOSIS — F1120 Opioid dependence, uncomplicated: Secondary | ICD-10-CM | POA: Diagnosis not present

## 2022-11-05 DIAGNOSIS — Z7151 Drug abuse counseling and surveillance of drug abuser: Secondary | ICD-10-CM | POA: Diagnosis not present

## 2022-11-05 DIAGNOSIS — F1120 Opioid dependence, uncomplicated: Secondary | ICD-10-CM | POA: Diagnosis not present

## 2022-11-06 DIAGNOSIS — R739 Hyperglycemia, unspecified: Secondary | ICD-10-CM | POA: Diagnosis not present

## 2022-11-06 DIAGNOSIS — Z0001 Encounter for general adult medical examination with abnormal findings: Secondary | ICD-10-CM | POA: Diagnosis not present

## 2022-11-06 DIAGNOSIS — Z79899 Other long term (current) drug therapy: Secondary | ICD-10-CM | POA: Diagnosis not present

## 2022-11-06 DIAGNOSIS — Z125 Encounter for screening for malignant neoplasm of prostate: Secondary | ICD-10-CM | POA: Diagnosis not present

## 2022-11-06 DIAGNOSIS — F1120 Opioid dependence, uncomplicated: Secondary | ICD-10-CM | POA: Diagnosis not present

## 2022-11-12 DIAGNOSIS — Z7151 Drug abuse counseling and surveillance of drug abuser: Secondary | ICD-10-CM | POA: Diagnosis not present

## 2022-11-12 DIAGNOSIS — F1120 Opioid dependence, uncomplicated: Secondary | ICD-10-CM | POA: Diagnosis not present

## 2022-11-13 DIAGNOSIS — F1120 Opioid dependence, uncomplicated: Secondary | ICD-10-CM | POA: Diagnosis not present

## 2022-11-13 DIAGNOSIS — Z7151 Drug abuse counseling and surveillance of drug abuser: Secondary | ICD-10-CM | POA: Diagnosis not present

## 2022-11-19 DIAGNOSIS — Z7151 Drug abuse counseling and surveillance of drug abuser: Secondary | ICD-10-CM | POA: Diagnosis not present

## 2022-11-19 DIAGNOSIS — F1120 Opioid dependence, uncomplicated: Secondary | ICD-10-CM | POA: Diagnosis not present

## 2022-11-26 DIAGNOSIS — Z7151 Drug abuse counseling and surveillance of drug abuser: Secondary | ICD-10-CM | POA: Diagnosis not present

## 2022-11-26 DIAGNOSIS — F1120 Opioid dependence, uncomplicated: Secondary | ICD-10-CM | POA: Diagnosis not present

## 2022-12-02 DIAGNOSIS — Z7151 Drug abuse counseling and surveillance of drug abuser: Secondary | ICD-10-CM | POA: Diagnosis not present

## 2022-12-02 DIAGNOSIS — F1120 Opioid dependence, uncomplicated: Secondary | ICD-10-CM | POA: Diagnosis not present

## 2022-12-09 DIAGNOSIS — Z7151 Drug abuse counseling and surveillance of drug abuser: Secondary | ICD-10-CM | POA: Diagnosis not present

## 2022-12-09 DIAGNOSIS — F1120 Opioid dependence, uncomplicated: Secondary | ICD-10-CM | POA: Diagnosis not present

## 2022-12-10 DIAGNOSIS — F1120 Opioid dependence, uncomplicated: Secondary | ICD-10-CM | POA: Diagnosis not present

## 2022-12-10 DIAGNOSIS — Z7151 Drug abuse counseling and surveillance of drug abuser: Secondary | ICD-10-CM | POA: Diagnosis not present

## 2022-12-11 DIAGNOSIS — F1120 Opioid dependence, uncomplicated: Secondary | ICD-10-CM | POA: Diagnosis not present

## 2022-12-11 DIAGNOSIS — Z7151 Drug abuse counseling and surveillance of drug abuser: Secondary | ICD-10-CM | POA: Diagnosis not present

## 2022-12-17 ENCOUNTER — Ambulatory Visit: Payer: BC Managed Care – PPO | Admitting: Cardiology

## 2022-12-24 DIAGNOSIS — F1120 Opioid dependence, uncomplicated: Secondary | ICD-10-CM | POA: Diagnosis not present

## 2022-12-24 DIAGNOSIS — Z7151 Drug abuse counseling and surveillance of drug abuser: Secondary | ICD-10-CM | POA: Diagnosis not present

## 2023-01-01 DIAGNOSIS — F1120 Opioid dependence, uncomplicated: Secondary | ICD-10-CM | POA: Diagnosis not present

## 2023-01-07 DIAGNOSIS — Z7151 Drug abuse counseling and surveillance of drug abuser: Secondary | ICD-10-CM | POA: Diagnosis not present

## 2023-01-07 DIAGNOSIS — F1120 Opioid dependence, uncomplicated: Secondary | ICD-10-CM | POA: Diagnosis not present

## 2023-01-08 DIAGNOSIS — J410 Simple chronic bronchitis: Secondary | ICD-10-CM | POA: Diagnosis not present

## 2023-01-08 DIAGNOSIS — F1120 Opioid dependence, uncomplicated: Secondary | ICD-10-CM | POA: Diagnosis not present

## 2023-01-08 DIAGNOSIS — F411 Generalized anxiety disorder: Secondary | ICD-10-CM | POA: Diagnosis not present

## 2023-01-08 DIAGNOSIS — M545 Low back pain, unspecified: Secondary | ICD-10-CM | POA: Diagnosis not present

## 2023-01-08 DIAGNOSIS — Z7151 Drug abuse counseling and surveillance of drug abuser: Secondary | ICD-10-CM | POA: Diagnosis not present

## 2023-01-08 DIAGNOSIS — F17218 Nicotine dependence, cigarettes, with other nicotine-induced disorders: Secondary | ICD-10-CM | POA: Diagnosis not present

## 2023-01-12 ENCOUNTER — Telehealth: Payer: Self-pay | Admitting: Radiology

## 2023-01-12 NOTE — Telephone Encounter (Signed)
Patient called, LMVM asking for a call back.  Unsure what is needed, can you please call and see?  Thanks!

## 2023-01-14 DIAGNOSIS — Z7151 Drug abuse counseling and surveillance of drug abuser: Secondary | ICD-10-CM | POA: Diagnosis not present

## 2023-01-14 DIAGNOSIS — F1120 Opioid dependence, uncomplicated: Secondary | ICD-10-CM | POA: Diagnosis not present

## 2023-01-15 DIAGNOSIS — F1120 Opioid dependence, uncomplicated: Secondary | ICD-10-CM | POA: Diagnosis not present

## 2023-01-28 DIAGNOSIS — F1120 Opioid dependence, uncomplicated: Secondary | ICD-10-CM | POA: Diagnosis not present

## 2023-01-28 DIAGNOSIS — Z7151 Drug abuse counseling and surveillance of drug abuser: Secondary | ICD-10-CM | POA: Diagnosis not present

## 2023-02-04 DIAGNOSIS — F1120 Opioid dependence, uncomplicated: Secondary | ICD-10-CM | POA: Diagnosis not present

## 2023-02-04 DIAGNOSIS — Z7151 Drug abuse counseling and surveillance of drug abuser: Secondary | ICD-10-CM | POA: Diagnosis not present

## 2023-02-05 DIAGNOSIS — Z7151 Drug abuse counseling and surveillance of drug abuser: Secondary | ICD-10-CM | POA: Diagnosis not present

## 2023-02-05 DIAGNOSIS — F1120 Opioid dependence, uncomplicated: Secondary | ICD-10-CM | POA: Diagnosis not present

## 2023-02-10 DIAGNOSIS — F1120 Opioid dependence, uncomplicated: Secondary | ICD-10-CM | POA: Diagnosis not present

## 2023-02-10 DIAGNOSIS — Z7151 Drug abuse counseling and surveillance of drug abuser: Secondary | ICD-10-CM | POA: Diagnosis not present

## 2023-02-11 DIAGNOSIS — F1120 Opioid dependence, uncomplicated: Secondary | ICD-10-CM | POA: Diagnosis not present

## 2023-02-11 DIAGNOSIS — Z7151 Drug abuse counseling and surveillance of drug abuser: Secondary | ICD-10-CM | POA: Diagnosis not present

## 2023-02-12 DIAGNOSIS — F1120 Opioid dependence, uncomplicated: Secondary | ICD-10-CM | POA: Diagnosis not present

## 2023-02-17 DIAGNOSIS — F1120 Opioid dependence, uncomplicated: Secondary | ICD-10-CM | POA: Diagnosis not present

## 2023-02-17 DIAGNOSIS — Z7151 Drug abuse counseling and surveillance of drug abuser: Secondary | ICD-10-CM | POA: Diagnosis not present

## 2023-02-18 DIAGNOSIS — F1120 Opioid dependence, uncomplicated: Secondary | ICD-10-CM | POA: Diagnosis not present

## 2023-02-18 DIAGNOSIS — Z7151 Drug abuse counseling and surveillance of drug abuser: Secondary | ICD-10-CM | POA: Diagnosis not present

## 2023-02-25 DIAGNOSIS — Z7151 Drug abuse counseling and surveillance of drug abuser: Secondary | ICD-10-CM | POA: Diagnosis not present

## 2023-02-25 DIAGNOSIS — F1120 Opioid dependence, uncomplicated: Secondary | ICD-10-CM | POA: Diagnosis not present

## 2023-03-03 DIAGNOSIS — Z7151 Drug abuse counseling and surveillance of drug abuser: Secondary | ICD-10-CM | POA: Diagnosis not present

## 2023-03-03 DIAGNOSIS — F1120 Opioid dependence, uncomplicated: Secondary | ICD-10-CM | POA: Diagnosis not present

## 2023-03-05 DIAGNOSIS — Z7151 Drug abuse counseling and surveillance of drug abuser: Secondary | ICD-10-CM | POA: Diagnosis not present

## 2023-03-05 DIAGNOSIS — F1120 Opioid dependence, uncomplicated: Secondary | ICD-10-CM | POA: Diagnosis not present

## 2023-03-11 DIAGNOSIS — Z7151 Drug abuse counseling and surveillance of drug abuser: Secondary | ICD-10-CM | POA: Diagnosis not present

## 2023-03-11 DIAGNOSIS — F1120 Opioid dependence, uncomplicated: Secondary | ICD-10-CM | POA: Diagnosis not present

## 2023-03-18 DIAGNOSIS — F1120 Opioid dependence, uncomplicated: Secondary | ICD-10-CM | POA: Diagnosis not present

## 2023-03-18 DIAGNOSIS — Z7151 Drug abuse counseling and surveillance of drug abuser: Secondary | ICD-10-CM | POA: Diagnosis not present

## 2023-03-20 DIAGNOSIS — Z6833 Body mass index (BMI) 33.0-33.9, adult: Secondary | ICD-10-CM | POA: Diagnosis not present

## 2023-03-20 DIAGNOSIS — J069 Acute upper respiratory infection, unspecified: Secondary | ICD-10-CM | POA: Diagnosis not present

## 2023-03-20 DIAGNOSIS — E669 Obesity, unspecified: Secondary | ICD-10-CM | POA: Diagnosis not present

## 2023-03-24 DIAGNOSIS — F1120 Opioid dependence, uncomplicated: Secondary | ICD-10-CM | POA: Diagnosis not present

## 2023-03-24 DIAGNOSIS — Z7151 Drug abuse counseling and surveillance of drug abuser: Secondary | ICD-10-CM | POA: Diagnosis not present

## 2023-04-01 DIAGNOSIS — Z7151 Drug abuse counseling and surveillance of drug abuser: Secondary | ICD-10-CM | POA: Diagnosis not present

## 2023-04-01 DIAGNOSIS — F1120 Opioid dependence, uncomplicated: Secondary | ICD-10-CM | POA: Diagnosis not present

## 2023-04-02 DIAGNOSIS — Z7151 Drug abuse counseling and surveillance of drug abuser: Secondary | ICD-10-CM | POA: Diagnosis not present

## 2023-04-02 DIAGNOSIS — F1120 Opioid dependence, uncomplicated: Secondary | ICD-10-CM | POA: Diagnosis not present

## 2023-04-08 DIAGNOSIS — Z7151 Drug abuse counseling and surveillance of drug abuser: Secondary | ICD-10-CM | POA: Diagnosis not present

## 2023-04-08 DIAGNOSIS — F1120 Opioid dependence, uncomplicated: Secondary | ICD-10-CM | POA: Diagnosis not present

## 2023-04-15 DIAGNOSIS — Z7151 Drug abuse counseling and surveillance of drug abuser: Secondary | ICD-10-CM | POA: Diagnosis not present

## 2023-04-15 DIAGNOSIS — F1120 Opioid dependence, uncomplicated: Secondary | ICD-10-CM | POA: Diagnosis not present

## 2023-04-23 DIAGNOSIS — F1120 Opioid dependence, uncomplicated: Secondary | ICD-10-CM | POA: Diagnosis not present

## 2023-04-24 ENCOUNTER — Ambulatory Visit: Payer: BC Managed Care – PPO | Admitting: Cardiology

## 2023-04-29 DIAGNOSIS — F1120 Opioid dependence, uncomplicated: Secondary | ICD-10-CM | POA: Diagnosis not present

## 2023-04-29 DIAGNOSIS — Z7151 Drug abuse counseling and surveillance of drug abuser: Secondary | ICD-10-CM | POA: Diagnosis not present

## 2023-04-30 DIAGNOSIS — Z7151 Drug abuse counseling and surveillance of drug abuser: Secondary | ICD-10-CM | POA: Diagnosis not present

## 2023-04-30 DIAGNOSIS — F1120 Opioid dependence, uncomplicated: Secondary | ICD-10-CM | POA: Diagnosis not present

## 2023-05-05 DIAGNOSIS — F1120 Opioid dependence, uncomplicated: Secondary | ICD-10-CM | POA: Diagnosis not present

## 2023-05-05 DIAGNOSIS — Z7151 Drug abuse counseling and surveillance of drug abuser: Secondary | ICD-10-CM | POA: Diagnosis not present

## 2023-05-06 DIAGNOSIS — F1120 Opioid dependence, uncomplicated: Secondary | ICD-10-CM | POA: Diagnosis not present

## 2023-05-06 DIAGNOSIS — Z7151 Drug abuse counseling and surveillance of drug abuser: Secondary | ICD-10-CM | POA: Diagnosis not present

## 2023-05-13 DIAGNOSIS — Z7151 Drug abuse counseling and surveillance of drug abuser: Secondary | ICD-10-CM | POA: Diagnosis not present

## 2023-05-13 DIAGNOSIS — F1120 Opioid dependence, uncomplicated: Secondary | ICD-10-CM | POA: Diagnosis not present

## 2023-05-19 DIAGNOSIS — F1120 Opioid dependence, uncomplicated: Secondary | ICD-10-CM | POA: Diagnosis not present

## 2023-05-19 DIAGNOSIS — Z7151 Drug abuse counseling and surveillance of drug abuser: Secondary | ICD-10-CM | POA: Diagnosis not present

## 2023-05-20 DIAGNOSIS — F1120 Opioid dependence, uncomplicated: Secondary | ICD-10-CM | POA: Diagnosis not present

## 2023-05-28 DIAGNOSIS — F1120 Opioid dependence, uncomplicated: Secondary | ICD-10-CM | POA: Diagnosis not present

## 2023-05-28 DIAGNOSIS — Z7151 Drug abuse counseling and surveillance of drug abuser: Secondary | ICD-10-CM | POA: Diagnosis not present

## 2023-06-03 DIAGNOSIS — Z7151 Drug abuse counseling and surveillance of drug abuser: Secondary | ICD-10-CM | POA: Diagnosis not present

## 2023-06-03 DIAGNOSIS — F1120 Opioid dependence, uncomplicated: Secondary | ICD-10-CM | POA: Diagnosis not present

## 2023-06-04 DIAGNOSIS — F1120 Opioid dependence, uncomplicated: Secondary | ICD-10-CM | POA: Diagnosis not present

## 2023-06-17 DIAGNOSIS — F1120 Opioid dependence, uncomplicated: Secondary | ICD-10-CM | POA: Diagnosis not present

## 2023-06-17 DIAGNOSIS — Z7151 Drug abuse counseling and surveillance of drug abuser: Secondary | ICD-10-CM | POA: Diagnosis not present

## 2023-06-24 DIAGNOSIS — F1120 Opioid dependence, uncomplicated: Secondary | ICD-10-CM | POA: Diagnosis not present

## 2023-06-24 DIAGNOSIS — Z7151 Drug abuse counseling and surveillance of drug abuser: Secondary | ICD-10-CM | POA: Diagnosis not present

## 2023-06-25 DIAGNOSIS — F1120 Opioid dependence, uncomplicated: Secondary | ICD-10-CM | POA: Diagnosis not present

## 2023-06-25 DIAGNOSIS — Z7151 Drug abuse counseling and surveillance of drug abuser: Secondary | ICD-10-CM | POA: Diagnosis not present

## 2023-07-08 DIAGNOSIS — F1120 Opioid dependence, uncomplicated: Secondary | ICD-10-CM | POA: Diagnosis not present

## 2023-07-08 DIAGNOSIS — Z7151 Drug abuse counseling and surveillance of drug abuser: Secondary | ICD-10-CM | POA: Diagnosis not present

## 2023-07-21 DIAGNOSIS — Z7151 Drug abuse counseling and surveillance of drug abuser: Secondary | ICD-10-CM | POA: Diagnosis not present

## 2023-07-21 DIAGNOSIS — F1120 Opioid dependence, uncomplicated: Secondary | ICD-10-CM | POA: Diagnosis not present

## 2023-07-23 DIAGNOSIS — F1120 Opioid dependence, uncomplicated: Secondary | ICD-10-CM | POA: Diagnosis not present

## 2023-07-23 DIAGNOSIS — Z7151 Drug abuse counseling and surveillance of drug abuser: Secondary | ICD-10-CM | POA: Diagnosis not present

## 2023-07-30 DIAGNOSIS — F1120 Opioid dependence, uncomplicated: Secondary | ICD-10-CM | POA: Diagnosis not present

## 2023-08-05 DIAGNOSIS — Z7151 Drug abuse counseling and surveillance of drug abuser: Secondary | ICD-10-CM | POA: Diagnosis not present

## 2023-08-05 DIAGNOSIS — F1120 Opioid dependence, uncomplicated: Secondary | ICD-10-CM | POA: Diagnosis not present

## 2023-08-05 NOTE — Progress Notes (Signed)
H&P  Chief Complaint: Decreased libido  History of Present Illness: 47 year old male self-referred for evaluation and management of low energy level, decreased libido.  Symptoms have been going on for at least 6 months.  He does have a history of narcotic abuse, and has been drug-free for approximately 10 years.  He is on Suboxone.  He denies any problems obtaining or maintaining erections.  No lower urinary tract symptoms.  Would also like a prostate check.  Remote history of pelvic discomfort, seen by Dr. Ronne Binning in this practice about 7 years ago.  Since then, no recurrence of issues.  Past Medical History:  Diagnosis Date   Anxiety    Bronchitis    Depression    Opiate addiction (HCC)    Panic attacks    Pneumonia    Testicular torsion     Past Surgical History:  Procedure Laterality Date   APPENDECTOMY     EYE SURGERY     NASAL SINUS SURGERY     VASECTOMY      Home Medications:  Allergies as of 08/06/2023       Reactions   No Known Allergies    Other    No narcotics please        Medication List        Accurate as of August 05, 2023 12:52 PM. If you have any questions, ask your nurse or doctor.          albuterol 108 (90 Base) MCG/ACT inhaler Commonly known as: VENTOLIN HFA Inhale 2 puffs into the lungs 4 (four) times daily as needed.   azithromycin 250 MG tablet Commonly known as: ZITHROMAX Take 1 tablet (250 mg total) by mouth daily. Take first 2 tablets together, then 1 every day until finished.   Buprenorphine HCl-Naloxone HCl 12-3 MG Film Place 1 strip under the tongue 2 (two) times daily.   ibuprofen 200 MG tablet Commonly known as: ADVIL Take 800 mg by mouth 2 (two) times daily as needed. Taken once or twice a day for pain   ipratropium-albuterol 0.5-2.5 (3) MG/3ML Soln Commonly known as: DUONEB Take 3 mLs by nebulization every 6 (six) hours as needed (shortness of breath).   predniSONE 10 MG tablet Commonly known as: DELTASONE 6, 5, 4,  3, 2 then 1 tablet by mouth daily for 6 days total.        Allergies:  Allergies  Allergen Reactions   No Known Allergies    Other     No narcotics please    Family History  Problem Relation Age of Onset   Diabetes Mother    Hypertension Father    Cancer Other     Social History:  reports that he has been smoking cigarettes. He started smoking about 34 years ago. He has a 69.2 pack-year smoking history. He has never used smokeless tobacco. He reports that he does not currently use drugs after having used the following drugs: Marijuana. He reports that he does not drink alcohol.  ROS: A complete review of systems was performed.  All systems are negative except for pertinent findings as noted.  Physical Exam:  Vital signs in last 24 hours: There were no vitals taken for this visit. Constitutional:  Alert and oriented, No acute distress Cardiovascular: Regular rate  Respiratory: Normal respiratory effort GI: Abdomen is soft, nontender, nondistended, no abdominal masses. No CVAT.  Genitourinary: Normal male phallus, testicles descended bilaterally.  Slight left testicular atrophy.  No inguinal hernias.  Normal anal sphincter tone.  Gland 20 g, symmetric, nonnodular, nontender. Lymphatic: No lymphadenopathy Neurologic: Grossly intact, no focal deficits Psychiatric: Normal mood and affect  I have reviewed prior pt notes[facility].  I have reviewed urinalysis results--clear  I have independently reviewed bladder scan-residual negligible     Impression/Assessment:  Low libido/decreased energy level  Prostate cancer screening  Plan:  1.  Testosterone panel drawn today as well as PSA  2.  I will call with results and further follow-up  3.  Heart healthy lifestyle discussed

## 2023-08-06 ENCOUNTER — Encounter: Payer: Self-pay | Admitting: Urology

## 2023-08-06 ENCOUNTER — Ambulatory Visit (INDEPENDENT_AMBULATORY_CARE_PROVIDER_SITE_OTHER): Payer: BC Managed Care – PPO | Admitting: Urology

## 2023-08-06 VITALS — BP 146/80 | HR 61

## 2023-08-06 DIAGNOSIS — R6882 Decreased libido: Secondary | ICD-10-CM

## 2023-08-06 DIAGNOSIS — Z125 Encounter for screening for malignant neoplasm of prostate: Secondary | ICD-10-CM

## 2023-08-06 DIAGNOSIS — R339 Retention of urine, unspecified: Secondary | ICD-10-CM

## 2023-08-06 LAB — URINALYSIS, ROUTINE W REFLEX MICROSCOPIC
Bilirubin, UA: NEGATIVE
Glucose, UA: NEGATIVE
Ketones, UA: NEGATIVE
Leukocytes,UA: NEGATIVE
Nitrite, UA: NEGATIVE
Protein,UA: NEGATIVE
RBC, UA: NEGATIVE
Specific Gravity, UA: 1.03 (ref 1.005–1.030)
Urobilinogen, Ur: 0.2 mg/dL (ref 0.2–1.0)
pH, UA: 6 (ref 5.0–7.5)

## 2023-08-06 LAB — BLADDER SCAN AMB NON-IMAGING: Scan Result: 0

## 2023-08-11 ENCOUNTER — Encounter: Payer: Self-pay | Admitting: Urology

## 2023-08-12 NOTE — Telephone Encounter (Signed)
Please see pt message below.

## 2023-08-13 ENCOUNTER — Other Ambulatory Visit: Payer: Self-pay | Admitting: Urology

## 2023-08-13 DIAGNOSIS — R7989 Other specified abnormal findings of blood chemistry: Secondary | ICD-10-CM

## 2023-08-13 MED ORDER — TESTOSTERONE CYPIONATE 200 MG/ML IM SOLN: 100.0000 mg | INTRAMUSCULAR | 1 refills | Status: DC

## 2023-08-15 ENCOUNTER — Other Ambulatory Visit: Payer: Self-pay

## 2023-08-15 MED ORDER — "SAFETY SYRINGE/NEEDLE 22G X 1-1/2"" 3 ML MISC": 12 refills | Status: DC

## 2023-08-15 MED ORDER — "NEEDLE (DISP) 18G X 1-1/2"" MISC": 12 refills | Status: DC

## 2023-08-15 NOTE — Telephone Encounter (Signed)
Ok just alternate injection site weekly.  Be sure to clean the injection area first.  I will cancel the nurse visit for teaching.  Please keep your lab apt and office visit.  Have a great weekend.

## 2023-08-18 DIAGNOSIS — Z7151 Drug abuse counseling and surveillance of drug abuser: Secondary | ICD-10-CM | POA: Diagnosis not present

## 2023-08-18 DIAGNOSIS — F1120 Opioid dependence, uncomplicated: Secondary | ICD-10-CM | POA: Diagnosis not present

## 2023-08-20 ENCOUNTER — Ambulatory Visit: Payer: BC Managed Care – PPO

## 2023-08-20 DIAGNOSIS — Z7151 Drug abuse counseling and surveillance of drug abuser: Secondary | ICD-10-CM | POA: Diagnosis not present

## 2023-08-20 DIAGNOSIS — F1120 Opioid dependence, uncomplicated: Secondary | ICD-10-CM | POA: Diagnosis not present

## 2023-08-26 DIAGNOSIS — F1120 Opioid dependence, uncomplicated: Secondary | ICD-10-CM | POA: Diagnosis not present

## 2023-08-26 DIAGNOSIS — Z7151 Drug abuse counseling and surveillance of drug abuser: Secondary | ICD-10-CM | POA: Diagnosis not present

## 2023-09-01 DIAGNOSIS — Z7151 Drug abuse counseling and surveillance of drug abuser: Secondary | ICD-10-CM | POA: Diagnosis not present

## 2023-09-01 DIAGNOSIS — F1120 Opioid dependence, uncomplicated: Secondary | ICD-10-CM | POA: Diagnosis not present

## 2023-09-09 DIAGNOSIS — F1120 Opioid dependence, uncomplicated: Secondary | ICD-10-CM | POA: Diagnosis not present

## 2023-09-09 DIAGNOSIS — Z7151 Drug abuse counseling and surveillance of drug abuser: Secondary | ICD-10-CM | POA: Diagnosis not present

## 2023-09-15 DIAGNOSIS — F1120 Opioid dependence, uncomplicated: Secondary | ICD-10-CM | POA: Diagnosis not present

## 2023-09-15 DIAGNOSIS — Z7151 Drug abuse counseling and surveillance of drug abuser: Secondary | ICD-10-CM | POA: Diagnosis not present

## 2023-09-16 DIAGNOSIS — F1120 Opioid dependence, uncomplicated: Secondary | ICD-10-CM | POA: Diagnosis not present

## 2023-09-16 DIAGNOSIS — Z7151 Drug abuse counseling and surveillance of drug abuser: Secondary | ICD-10-CM | POA: Diagnosis not present

## 2023-09-17 DIAGNOSIS — Z7151 Drug abuse counseling and surveillance of drug abuser: Secondary | ICD-10-CM | POA: Diagnosis not present

## 2023-09-17 DIAGNOSIS — F1120 Opioid dependence, uncomplicated: Secondary | ICD-10-CM | POA: Diagnosis not present

## 2023-09-23 ENCOUNTER — Other Ambulatory Visit: Payer: BC Managed Care – PPO

## 2023-09-23 DIAGNOSIS — Z7151 Drug abuse counseling and surveillance of drug abuser: Secondary | ICD-10-CM | POA: Diagnosis not present

## 2023-09-23 DIAGNOSIS — R7989 Other specified abnormal findings of blood chemistry: Secondary | ICD-10-CM | POA: Diagnosis not present

## 2023-09-23 DIAGNOSIS — F1120 Opioid dependence, uncomplicated: Secondary | ICD-10-CM | POA: Diagnosis not present

## 2023-09-24 DIAGNOSIS — F1120 Opioid dependence, uncomplicated: Secondary | ICD-10-CM | POA: Diagnosis not present

## 2023-09-24 LAB — PROLACTIN: Prolactin: 40.6 ng/mL — ABNORMAL HIGH (ref 3.9–22.7)

## 2023-09-24 LAB — TESTOSTERONE: Testosterone: 763 ng/dL (ref 264–916)

## 2023-09-29 DIAGNOSIS — F1120 Opioid dependence, uncomplicated: Secondary | ICD-10-CM | POA: Diagnosis not present

## 2023-09-29 DIAGNOSIS — Z7151 Drug abuse counseling and surveillance of drug abuser: Secondary | ICD-10-CM | POA: Diagnosis not present

## 2023-09-29 NOTE — Telephone Encounter (Signed)
Patient scheduled for 10/08 at 3:45pm

## 2023-09-30 ENCOUNTER — Ambulatory Visit: Payer: BC Managed Care – PPO | Admitting: Urology

## 2023-10-01 ENCOUNTER — Ambulatory Visit: Payer: BC Managed Care – PPO | Admitting: Urology

## 2023-10-06 NOTE — Progress Notes (Signed)
History of Present Illness: Here for f/u of low testosterone. He started on repletion (100 mg terstosterone cypionate IM q week) in mid August.  Recent labs--  PRL 41 T 763  Past Medical History:  Diagnosis Date   Anxiety    Bronchitis    COPD (chronic obstructive pulmonary disease) (HCC)    Depression    Opiate addiction (HCC)    Panic attacks    Pneumonia    Testicular torsion     Past Surgical History:  Procedure Laterality Date   APPENDECTOMY     EYE SURGERY     NASAL SINUS SURGERY     VASECTOMY      Home Medications:  Allergies as of 10/07/2023       Reactions   No Known Allergies    Other    No narcotics please        Medication List        Accurate as of October 06, 2023 11:02 AM. If you have any questions, ask your nurse or doctor.          albuterol 108 (90 Base) MCG/ACT inhaler Commonly known as: VENTOLIN HFA Inhale 2 puffs into the lungs 4 (four) times daily as needed.   azithromycin 250 MG tablet Commonly known as: ZITHROMAX Take 1 tablet (250 mg total) by mouth daily. Take first 2 tablets together, then 1 every day until finished.   Buprenorphine HCl-Naloxone HCl 12-3 MG Film Place 1 strip under the tongue 2 (two) times daily.   ibuprofen 200 MG tablet Commonly known as: ADVIL Take 800 mg by mouth 2 (two) times daily as needed. Taken once or twice a day for pain   ipratropium-albuterol 0.5-2.5 (3) MG/3ML Soln Commonly known as: DUONEB Take 3 mLs by nebulization every 6 (six) hours as needed (shortness of breath).   NEEDLE (DISP) 18 G 18G X 1-1/2" Misc Use to draw up testosterone for injection.   predniSONE 10 MG tablet Commonly known as: DELTASONE 6, 5, 4, 3, 2 then 1 tablet by mouth daily for 6 days total.   Safety Syringe/Needle 22G X 1-1/2" 3 ML Misc Generic drug: SYRINGE-NEEDLE (DISP) 3 ML Use to inject testosterone.   testosterone cypionate 200 MG/ML injection Commonly known as: DEPOTESTOSTERONE CYPIONATE Inject 0.5 mLs  (100 mg total) into the muscle every 7 (seven) days.        Allergies:  Allergies  Allergen Reactions   No Known Allergies    Other     No narcotics please    Family History  Problem Relation Age of Onset   Diabetes Mother    Hypertension Father    Cancer Other     Social History:  reports that he has been smoking cigarettes. He started smoking about 34 years ago. He has a 69.5 pack-year smoking history. He has never used smokeless tobacco. He reports that he does not currently use drugs after having used the following drugs: Marijuana. He reports that he does not drink alcohol.  ROS: A complete review of systems was performed.  All systems are negative except for pertinent findings as noted.  Physical Exam:  Vital signs in last 24 hours: There were no vitals taken for this visit. Constitutional:  Alert and oriented, No acute distress Cardiovascular: Regular rate  Respiratory: Normal respiratory effort GI: Abdomen is soft, nontender, nondistended, no abdominal masses. No CVAT.  Genitourinary: Normal male phallus, testes are descended bilaterally and non-tender and without masses, scrotum is normal in appearance without lesions or  masses, perineum is normal on inspection. Lymphatic: No lymphadenopathy Neurologic: Grossly intact, no focal deficits Psychiatric: Normal mood and affect  I have reviewed prior pt notes  I have reviewed notes from referring/previous physicians  I have reviewed urinalysis results  I have independently reviewed prior imaging  I have reviewed prior prolactin, testosterone, psa results    Impression/Assessment:  ***  Plan:  ***

## 2023-10-07 ENCOUNTER — Ambulatory Visit (INDEPENDENT_AMBULATORY_CARE_PROVIDER_SITE_OTHER): Payer: BC Managed Care – PPO | Admitting: Urology

## 2023-10-07 ENCOUNTER — Encounter: Payer: Self-pay | Admitting: Urology

## 2023-10-07 ENCOUNTER — Telehealth: Payer: Self-pay | Admitting: Urology

## 2023-10-07 VITALS — BP 155/88 | HR 64

## 2023-10-07 DIAGNOSIS — R7989 Other specified abnormal findings of blood chemistry: Secondary | ICD-10-CM

## 2023-10-07 DIAGNOSIS — R6882 Decreased libido: Secondary | ICD-10-CM | POA: Diagnosis not present

## 2023-10-07 DIAGNOSIS — Z7151 Drug abuse counseling and surveillance of drug abuser: Secondary | ICD-10-CM | POA: Diagnosis not present

## 2023-10-07 DIAGNOSIS — F1120 Opioid dependence, uncomplicated: Secondary | ICD-10-CM | POA: Diagnosis not present

## 2023-10-07 NOTE — Telephone Encounter (Signed)
Patient needs refill of cypionate (DEPOTESTOSTERONE CYPIONATE) 200 MG/ML injection [[id number]]  sent to [facility] on [address] [place]

## 2023-10-08 NOTE — Telephone Encounter (Signed)
Please refill if appropriate

## 2023-10-09 NOTE — Telephone Encounter (Signed)
Patient was not aware of additional refill at his pharmacy.  He will contact [facility] for his refill.  He did state he his not happy with his [facility].  I informed him when he calls in for a new rx we can have the MD send to another preferred pharmacy.  Patient voiced understanding,.

## 2023-10-11 DIAGNOSIS — M5441 Lumbago with sciatica, right side: Secondary | ICD-10-CM | POA: Diagnosis not present

## 2023-10-11 DIAGNOSIS — M9903 Segmental and somatic dysfunction of lumbar region: Secondary | ICD-10-CM | POA: Diagnosis not present

## 2023-10-11 DIAGNOSIS — M9905 Segmental and somatic dysfunction of pelvic region: Secondary | ICD-10-CM | POA: Diagnosis not present

## 2023-10-11 DIAGNOSIS — M9901 Segmental and somatic dysfunction of cervical region: Secondary | ICD-10-CM | POA: Diagnosis not present

## 2023-10-13 DIAGNOSIS — F1120 Opioid dependence, uncomplicated: Secondary | ICD-10-CM | POA: Diagnosis not present

## 2023-10-13 DIAGNOSIS — Z7151 Drug abuse counseling and surveillance of drug abuser: Secondary | ICD-10-CM | POA: Diagnosis not present

## 2023-10-14 DIAGNOSIS — F1120 Opioid dependence, uncomplicated: Secondary | ICD-10-CM | POA: Diagnosis not present

## 2023-10-14 DIAGNOSIS — Z7151 Drug abuse counseling and surveillance of drug abuser: Secondary | ICD-10-CM | POA: Diagnosis not present

## 2023-10-15 DIAGNOSIS — F1120 Opioid dependence, uncomplicated: Secondary | ICD-10-CM | POA: Diagnosis not present

## 2023-10-15 DIAGNOSIS — Z7151 Drug abuse counseling and surveillance of drug abuser: Secondary | ICD-10-CM | POA: Diagnosis not present

## 2023-10-21 ENCOUNTER — Ambulatory Visit: Payer: BC Managed Care – PPO | Admitting: Orthopaedic Surgery

## 2023-10-21 DIAGNOSIS — F1120 Opioid dependence, uncomplicated: Secondary | ICD-10-CM | POA: Diagnosis not present

## 2023-10-21 DIAGNOSIS — Z7151 Drug abuse counseling and surveillance of drug abuser: Secondary | ICD-10-CM | POA: Diagnosis not present

## 2023-10-22 DIAGNOSIS — F1120 Opioid dependence, uncomplicated: Secondary | ICD-10-CM | POA: Diagnosis not present

## 2023-10-28 ENCOUNTER — Ambulatory Visit: Payer: BC Managed Care – PPO | Admitting: Orthopaedic Surgery

## 2023-11-03 DIAGNOSIS — Z7151 Drug abuse counseling and surveillance of drug abuser: Secondary | ICD-10-CM | POA: Diagnosis not present

## 2023-11-03 DIAGNOSIS — F1120 Opioid dependence, uncomplicated: Secondary | ICD-10-CM | POA: Diagnosis not present

## 2023-11-12 DIAGNOSIS — Z7151 Drug abuse counseling and surveillance of drug abuser: Secondary | ICD-10-CM | POA: Diagnosis not present

## 2023-11-12 DIAGNOSIS — F1120 Opioid dependence, uncomplicated: Secondary | ICD-10-CM | POA: Diagnosis not present

## 2023-11-17 DIAGNOSIS — F1120 Opioid dependence, uncomplicated: Secondary | ICD-10-CM | POA: Diagnosis not present

## 2023-11-17 DIAGNOSIS — Z7151 Drug abuse counseling and surveillance of drug abuser: Secondary | ICD-10-CM | POA: Diagnosis not present

## 2023-11-18 DIAGNOSIS — Z7151 Drug abuse counseling and surveillance of drug abuser: Secondary | ICD-10-CM | POA: Diagnosis not present

## 2023-11-18 DIAGNOSIS — F1120 Opioid dependence, uncomplicated: Secondary | ICD-10-CM | POA: Diagnosis not present

## 2023-12-02 DIAGNOSIS — F1120 Opioid dependence, uncomplicated: Secondary | ICD-10-CM | POA: Diagnosis not present

## 2023-12-02 DIAGNOSIS — Z7151 Drug abuse counseling and surveillance of drug abuser: Secondary | ICD-10-CM | POA: Diagnosis not present

## 2023-12-10 DIAGNOSIS — F1120 Opioid dependence, uncomplicated: Secondary | ICD-10-CM | POA: Diagnosis not present

## 2023-12-10 DIAGNOSIS — Z7151 Drug abuse counseling and surveillance of drug abuser: Secondary | ICD-10-CM | POA: Diagnosis not present

## 2023-12-29 DIAGNOSIS — Z7151 Drug abuse counseling and surveillance of drug abuser: Secondary | ICD-10-CM | POA: Diagnosis not present

## 2023-12-29 DIAGNOSIS — F1120 Opioid dependence, uncomplicated: Secondary | ICD-10-CM | POA: Diagnosis not present

## 2024-01-03 ENCOUNTER — Emergency Department (HOSPITAL_COMMUNITY)
Admission: EM | Admit: 2024-01-03 | Discharge: 2024-01-03 | Disposition: A | Discharge status: Home / Self Care | Payer: BC Managed Care – PPO | Attending: Emergency Medicine | Admitting: Emergency Medicine

## 2024-01-03 ENCOUNTER — Encounter (HOSPITAL_COMMUNITY): Payer: Self-pay | Admitting: Emergency Medicine

## 2024-01-03 ENCOUNTER — Other Ambulatory Visit: Payer: Self-pay

## 2024-01-03 ENCOUNTER — Emergency Department (HOSPITAL_COMMUNITY): Payer: BC Managed Care – PPO

## 2024-01-03 DIAGNOSIS — R0789 Other chest pain: Secondary | ICD-10-CM | POA: Diagnosis not present

## 2024-01-03 DIAGNOSIS — R7989 Other specified abnormal findings of blood chemistry: Secondary | ICD-10-CM | POA: Insufficient documentation

## 2024-01-03 DIAGNOSIS — J449 Chronic obstructive pulmonary disease, unspecified: Secondary | ICD-10-CM | POA: Diagnosis not present

## 2024-01-03 DIAGNOSIS — Z7951 Long term (current) use of inhaled steroids: Secondary | ICD-10-CM | POA: Insufficient documentation

## 2024-01-03 DIAGNOSIS — R079 Chest pain, unspecified: Secondary | ICD-10-CM

## 2024-01-03 DIAGNOSIS — F1490 Cocaine use, unspecified, uncomplicated: Secondary | ICD-10-CM | POA: Insufficient documentation

## 2024-01-03 DIAGNOSIS — F1410 Cocaine abuse, uncomplicated: Secondary | ICD-10-CM | POA: Diagnosis not present

## 2024-01-03 DIAGNOSIS — I10 Essential (primary) hypertension: Secondary | ICD-10-CM | POA: Diagnosis not present

## 2024-01-03 LAB — BASIC METABOLIC PANEL
Anion gap: 8 (ref 5–15)
BUN: 13 mg/dL (ref 6–20)
CO2: 25 mmol/L (ref 22–32)
Calcium: 8.3 mg/dL — ABNORMAL LOW (ref 8.9–10.3)
Chloride: 101 mmol/L (ref 98–111)
Creatinine, Ser: 0.85 mg/dL (ref 0.61–1.24)
GFR, Estimated: >60 mL/min (ref >60)
Glucose, Bld: 194 mg/dL — ABNORMAL HIGH (ref 70–99)
Potassium: 3.5 mmol/L (ref 3.5–5.1)
Sodium: 134 mmol/L — ABNORMAL LOW (ref 135–145)

## 2024-01-03 LAB — CBC
HCT: 44.2 % (ref 39.0–52.0)
Hemoglobin: 15.2 g/dL (ref 13.0–17.0)
MCH: 30.2 pg (ref 26.0–34.0)
MCHC: 34.4 g/dL (ref 30.0–36.0)
MCV: 87.9 fL (ref 80.0–100.0)
Platelets: 232 10*3/uL (ref 150–400)
RBC: 5.03 MIL/uL (ref 4.22–5.81)
RDW: 13.4 % (ref 11.5–15.5)
WBC: 13.5 10*3/uL — ABNORMAL HIGH (ref 4.0–10.5)
nRBC: 0 % (ref 0.0–0.2)

## 2024-01-03 LAB — TROPONIN I (HIGH SENSITIVITY)
Troponin I (High Sensitivity): 39 ng/L — ABNORMAL HIGH (ref <18)
Troponin I (High Sensitivity): 30 ng/L — ABNORMAL HIGH (ref <18)

## 2024-01-03 MED ORDER — HEPARIN BOLUS VIA INFUSION
4000.0000 [IU] | Freq: Once | INTRAVENOUS | Status: AC
Start: 2024-01-03 — End: 2024-01-03
  Administered 2024-01-03: 4000 [IU] via INTRAVENOUS

## 2024-01-03 MED ORDER — ASPIRIN 81 MG PO CHEW
324.0000 mg | CHEWABLE_TABLET | Freq: Once | ORAL | Status: AC
  Administered 2024-01-03: 324 mg via ORAL
  Filled 2024-01-03: qty 4

## 2024-01-03 MED ORDER — NITROGLYCERIN 0.4 MG SL SUBL
0.4000 mg | SUBLINGUAL_TABLET | SUBLINGUAL | Status: DC | PRN
  Administered 2024-01-03: 0.4 mg via SUBLINGUAL
  Filled 2024-01-03: qty 1

## 2024-01-03 MED ORDER — HEPARIN (PORCINE) 25000 UT/250ML-% IV SOLN
1200.0000 [IU]/h | INTRAVENOUS | Status: DC
  Administered 2024-01-03: 1200 [IU]/h via INTRAVENOUS
  Filled 2024-01-03: qty 250

## 2024-01-03 MED ORDER — LORAZEPAM 2 MG/ML IJ SOLN
1.0000 mg | Freq: Once | INTRAMUSCULAR | Status: AC
  Administered 2024-01-03: 1 mg via INTRAVENOUS
  Filled 2024-01-03: qty 1

## 2024-01-03 NOTE — Progress Notes (Signed)
 PHARMACY - ANTICOAGULATION CONSULT NOTE  Pharmacy Consult for IV heparin   Indication: chest pain/ACS  Allergies  Allergen Reactions   No Known Allergies    Other     No narcotics please    Patient Measurements: Weight: 104.3 kg (229 lb 15 oz) Heparin  Dosing Weight: 95.2 kg  Vital Signs: Temp: 98.4 F (36.9 C) (01/04 0649) Temp Source: Oral (01/04 0649) BP: 148/81 (01/04 0700) Pulse Rate: 71 (01/04 0700)  Labs: Recent Labs    01/03/24 0655  HGB 15.2  HCT 44.2  PLT 232  CREATININE 0.85  TROPONINIHS 39*    CrCl cannot be calculated (Unknown ideal weight.).   Medical History: Past Medical History:  Diagnosis Date   Anxiety    Bronchitis    COPD (chronic obstructive pulmonary disease) (HCC)    Depression    Opiate addiction (HCC)    Panic attacks    Pneumonia    Testicular torsion     Assessment: Dainel J Diers Jr. is a 48 y.o. year old male admitted on 01/03/2024 with CP and concern for ACS. No anticoagulation prior to admission. Pharmacy consulted to dose heparin .  Goal of Therapy:  Heparin  level 0.3-0.7 units/ml Monitor platelets by anticoagulation protocol: Yes   Plan Heparin  4000 units x 1 as bolus followed by heparin  infusion at 1200 units/hr 6 heparin  level  Daily heparin  level, CBC, and monitoring for bleeding F/u plans for anticoagulation and cards recs  Thank you for allowing pharmacy to participate in this patient's care.  Leonor GORMAN Bash, PharmD Emergency Medicine Clinical Pharmacist 01/03/2024,8:15 AM

## 2024-01-03 NOTE — ED Provider Notes (Signed)
  EMERGENCY DEPARTMENT AT [facility] Provider Note   CSN: [id number] Arrival date & time: 01/03/24  9359     History  Chief Complaint  Patient presents with   Chest Pain    John PARAS Adamcik Jr. is a 48 y.o. male.   Chest Pain    Patient has history of anxiety bronchitis pneumonia opiate addiction, panic attacks, COPD.  Patient states he was using cocaine this morning about an hour and a half ago.  Shortly after that he started feeling weird sensation in his chest including a pressure sensation.  He had some tingling in his extremities.  Patient denies shortness of breath or diaphoresis.  Patient was concerned that he was going to die.  He called EMS.  He was given aspirin  and nitroglycerin .  Home Medications Prior to Admission medications   Medication Sig Start Date End Date Taking? Authorizing Provider  albuterol  (VENTOLIN  HFA) 108 (90 Base) MCG/ACT inhaler Inhale 2 puffs into the lungs 4 (four) times daily as needed. 02/10/22   [provider]  azithromycin  (ZITHROMAX ) 250 MG tablet Take 1 tablet (250 mg total) by mouth daily. Take first 2 tablets together, then 1 every day until finished. Patient not taking: Reported on 10/02/2022 09/17/22   Idol, Julie, PA-C  Buprenorphine  HCl-Naloxone  HCl 12-3 MG FILM Place 1 strip under the tongue 2 (two) times daily. 11/15/21   [provider]  ibuprofen  (ADVIL ) 200 MG tablet Take 800 mg by mouth 2 (two) times daily as needed. Taken once or twice a day for pain    [provider]  ipratropium-albuterol  (DUONEB) 0.5-2.5 (3) MG/3ML SOLN Take 3 mLs by nebulization every 6 (six) hours as needed (shortness of breath).    [provider]  NEEDLE, DISP, 18 G 18G X 1-1/2 MISC Use to draw up testosterone  for injection. 08/15/23   Matilda Senior, MD  predniSONE  (DELTASONE ) 10 MG tablet 6, 5, 4, 3, 2 then 1 tablet by mouth daily for 6 days total. Patient not taking: Reported on 10/02/2022 09/17/22    Idol, Julie, PA-C  SYRINGE-NEEDLE, DISP, 3 ML (SAFETY SYRINGE/NEEDLE) 22G X 1-1/2 3 ML MISC Use to inject testosterone . 08/15/23   Matilda Senior, MD  testosterone  cypionate (DEPOTESTOSTERONE CYPIONATE) 200 MG/ML injection Inject 0.5 mLs (100 mg total) into the muscle every 7 (seven) days. 08/13/23   Matilda Senior, MD      Allergies    No known allergies and Other    Review of Systems   Review of Systems  Cardiovascular:  Positive for chest pain.    Physical Exam Updated Vital Signs BP 120/82   Pulse 62   Temp 98.8 F (37.1 C) (Oral)   Resp 15   Ht 1.778 m (5' 10)   Wt 104.3 kg   SpO2 94%   BMI 32.99 kg/m  Physical Exam Vitals and nursing note reviewed.  Constitutional:      General: He is not in acute distress.    Appearance: He is well-developed.  HENT:     Head: Normocephalic and atraumatic.     Right Ear: External ear normal.     Left Ear: External ear normal.  Eyes:     General: No scleral icterus.       Right eye: No discharge.        Left eye: No discharge.     Conjunctiva/sclera: Conjunctivae normal.  Neck:     Trachea: No tracheal deviation.  Cardiovascular:     Rate and Rhythm:  Normal rate and regular rhythm.  Pulmonary:     Effort: Pulmonary effort is normal. No respiratory distress.     Breath sounds: Normal breath sounds. No stridor. No wheezing or rales.  Abdominal:     General: Bowel sounds are normal. There is no distension.     Palpations: Abdomen is soft.     Tenderness: There is no abdominal tenderness. There is no guarding or rebound.  Musculoskeletal:        General: No tenderness or deformity.     Cervical back: Neck supple.  Skin:    General: Skin is warm and dry.     Findings: No rash.  Neurological:     General: No focal deficit present.     Mental Status: He is alert.     Cranial Nerves: No cranial nerve deficit, dysarthria or facial asymmetry.     Sensory: No sensory deficit.     Motor: No abnormal muscle tone or seizure  activity.     Coordination: Coordination normal.  Psychiatric:        Mood and Affect: Mood normal.     ED Results / Procedures / Treatments   Labs (all labs ordered are listed, but only abnormal results are displayed) Labs Reviewed  BASIC METABOLIC PANEL - Abnormal; Notable for the following components:      Result Value   Sodium 134 (*)    Glucose, Bld 194 (*)    Calcium 8.3 (*)    All other components within normal limits  CBC - Abnormal; Notable for the following components:   WBC 13.5 (*)    All other components within normal limits  TROPONIN I (HIGH SENSITIVITY) - Abnormal; Notable for the following components:   Troponin I (High Sensitivity) 39 (*)    All other components within normal limits  TROPONIN I (HIGH SENSITIVITY) - Abnormal; Notable for the following components:   Troponin I (High Sensitivity) 30 (*)    All other components within normal limits  HEPARIN  LEVEL (UNFRACTIONATED)    EKG EKG Interpretation Date/Time:  Saturday January 03 2024 06:50:09 EST Ventricular Rate:  78 PR Interval:  173 QRS Duration:  92 QT Interval:  374 QTC Calculation: 426 R Axis:   41  Text Interpretation: Sinus rhythm Borderline T wave abnormalities Baseline wander in lead(s) V1 Confirmed by Lorette Mayo [id number] on 01/03/2024 6:54:25 AM  Radiology DG Chest 2 View Result Date: 01/03/2024 CLINICAL DATA:  Left chest pressure after cocaine use. EXAM: CHEST - 2 VIEW COMPARISON:  10/02/2022 FINDINGS: Prominent interstitium diffusely, likely airway cuffing. No Kerley lines, effusion, or pneumothorax. Normal heart size and mediastinal contours. IMPRESSION: Generalized interstitial prominence which is likely bronchitic. Electronically Signed   By: Dorn Roulette M.D.   On: 01/03/2024 07:33    Procedures Procedures    Medications Ordered in ED Medications  nitroGLYCERIN  (NITROSTAT ) SL tablet 0.4 mg (0.4 mg Sublingual Given 01/03/24 0827)  heparin  ADULT infusion 100 units/mL (25000  units/250mL) (1,200 Units/hr Intravenous New Bag/Given 01/03/24 0827)  aspirin  chewable tablet 324 mg (324 mg Oral Given 01/03/24 0810)  LORazepam  (ATIVAN ) injection 1 mg (1 mg Intravenous Given 01/03/24 0810)  heparin  bolus via infusion 4,000 Units (4,000 Units Intravenous Bolus from Bag 01/03/24 0827)    ED Course/ Medical Decision Making/ A&P Clinical Course as of 01/03/24 1047  Sat Jan 03, 2024  0806 CBC(!) White blood cell count elevated.  Metabolic panel normal.  Troponin increased compared to previous [JK]  0807 DG Chest 2 View Chest  x-ray shows generalized interstitial prominence [JK]  0930 Patient states he remains chest pain-free [JK]  1033 Patient second trop is decreasing [JK]  1044 Discussed with Dr Maree.  Does not feel pt requires hospital admission.  [JK]    Clinical Course User Index [JK] Randol Simmonds, MD                                 Medical Decision Making Differential diagnosis includes but not limited to acute coronary syndrome, anxiety contact, cocaine induced vasospasm  Problems Addressed: Chest pain, unspecified type: acute illness or injury that poses a threat to life or bodily functions Cocaine use: acute illness or injury that poses a threat to life or bodily functions  Amount and/or Complexity of Data Reviewed Labs: ordered. Decision-making details documented in ED Course. Radiology:  Decision-making details documented in ED Course.  Risk OTC drugs. Prescription drug management.   She presented to the ED for evaluation of chest pain after cocaine use.  Patient noted to have an elevated troponin.  Second troponin remains elevated but not significantly increasing, and is in fact slightly lower.  Patient currently remains pain-free.  I discussed the case with Dr. Maree, regarding possible admission for cardiac observation.  He does not feel that is necessary at this time.  Patient also remains pain-free and his troponins are decreasing.  Encouraged him to  discontinue any recurrent cocaine use.  Will have him follow-up with cardiology as an outpatient.  Patient instructed to return to ED for any recurrent symptoms.        Final Clinical Impression(s) / ED Diagnoses Final diagnoses:  Chest pain, unspecified type  Cocaine use    Rx / DC Orders ED Discharge Orders     None         Randol Simmonds, MD 01/03/24 1047

## 2024-01-03 NOTE — ED Triage Notes (Signed)
 Pt in with L chest pressure, began after cocaine use 1.5hrs ago. Given 324mg  ASA and 1 NTG by EMS, VS en route: 149/70 86HR 98% CBG 193 18G LAC

## 2024-01-03 NOTE — ED Notes (Signed)
 Patient stated I feeling like wanting to leave, due to temp of room his hot,  This nurse offered ice pack to cool off and turned temp down, oral temp 98.8. Spoke with patient about trying to get the room cooled and his need to be on Cardiac monitor patient understood. Informed MD.

## 2024-01-03 NOTE — Discharge Instructions (Signed)
 The test today did show that your heart enzymes were slightly elevated.  They were decreasing fortunately.  Avoid using cocaine in the future.  Return to the ED if you have any recurrent chest pain symptoms.  I would recommend outpatient follow-up with cardiology to be rechecked

## 2024-01-06 ENCOUNTER — Encounter (HOSPITAL_COMMUNITY): Payer: Self-pay

## 2024-01-06 ENCOUNTER — Emergency Department (HOSPITAL_COMMUNITY)
Admission: EM | Admit: 2024-01-06 | Discharge: 2024-01-06 | Disposition: A | Discharge status: Home / Self Care | Payer: BC Managed Care – PPO | Attending: Emergency Medicine | Admitting: Emergency Medicine

## 2024-01-06 ENCOUNTER — Emergency Department (HOSPITAL_COMMUNITY): Payer: BC Managed Care – PPO

## 2024-01-06 DIAGNOSIS — R0789 Other chest pain: Secondary | ICD-10-CM | POA: Diagnosis not present

## 2024-01-06 DIAGNOSIS — J449 Chronic obstructive pulmonary disease, unspecified: Secondary | ICD-10-CM | POA: Diagnosis not present

## 2024-01-06 DIAGNOSIS — R079 Chest pain, unspecified: Secondary | ICD-10-CM | POA: Insufficient documentation

## 2024-01-06 DIAGNOSIS — Z7951 Long term (current) use of inhaled steroids: Secondary | ICD-10-CM | POA: Diagnosis not present

## 2024-01-06 LAB — BASIC METABOLIC PANEL
Anion gap: 10 (ref 5–15)
BUN: 11 mg/dL (ref 6–20)
CO2: 24 mmol/L (ref 22–32)
Calcium: 9.3 mg/dL (ref 8.9–10.3)
Chloride: 100 mmol/L (ref 98–111)
Creatinine, Ser: 0.81 mg/dL (ref 0.61–1.24)
GFR, Estimated: >60 mL/min (ref >60)
Glucose, Bld: 140 mg/dL — ABNORMAL HIGH (ref 70–99)
Potassium: 4.1 mmol/L (ref 3.5–5.1)
Sodium: 134 mmol/L — ABNORMAL LOW (ref 135–145)

## 2024-01-06 LAB — CBC
HCT: 48.9 % (ref 39.0–52.0)
Hemoglobin: 16.9 g/dL (ref 13.0–17.0)
MCH: 30 pg (ref 26.0–34.0)
MCHC: 34.6 g/dL (ref 30.0–36.0)
MCV: 86.9 fL (ref 80.0–100.0)
Platelets: 278 10*3/uL (ref 150–400)
RBC: 5.63 MIL/uL (ref 4.22–5.81)
RDW: 13.1 % (ref 11.5–15.5)
WBC: 8.6 10*3/uL (ref 4.0–10.5)
nRBC: 0 % (ref 0.0–0.2)

## 2024-01-06 LAB — TROPONIN I (HIGH SENSITIVITY)
Troponin I (High Sensitivity): 5 ng/L (ref <18)
Troponin I (High Sensitivity): 5 ng/L (ref <18)

## 2024-01-06 NOTE — Discharge Instructions (Signed)
 As discussed call your cardiologist to arrange a follow-up appointment for later this week.  Return to the emergency department if you develop any new or worsening symptoms.

## 2024-01-06 NOTE — ED Triage Notes (Signed)
 Pt c/o intermittent L chest pressure w/ radiation down L arm x3 days.  Pt was seen x3 days ago for same.  Pt reports he has been under significant stress and pain today started after a stressful phone call today.

## 2024-01-06 NOTE — ED Provider Notes (Signed)
 John [name]   CSN: [id number] Arrival date & time: 01/06/24  1125     History  Chief Complaint  Patient presents with   Chest Pain    John PARAS Thompson Jr. is a 48 y.o. male.   Chest Pain Associated symptoms: no abdominal pain, no back pain, no cough, no dizziness, no fever, no headache, no nausea, no numbness, no shortness of breath, no vomiting and no weakness        John J Topete Jr. is a 48 y.o. male medical history of anxiety, depression, COPD who presents to the Emergency Department complaining of recurrent chest pain.  Patient was seen here on 01/03/2024 for chest pain after using cocaine.  States his pain had improved at time of ER discharge.  Pain returned this morning but states pain today is less severe.  He describes as a discomfort to the left side of his chest.  He has had a couple of brief intermittent episodes of sharp pain radiating through the inside of his left upper arm.  He does admit to increased stressors and became upset earlier today at work when the chest pain today began.  Denies any nausea, vomiting, shortness of breath neck or jaw pain.  He was recommended to follow-up with cardiology on his previous ER visit, states he called the office but was unable to get an appointment so came here.    Home Medications Prior to Admission medications   Medication Sig Start Date End Date Taking? Authorizing Provider  albuterol  (VENTOLIN  HFA) 108 (90 Base) MCG/ACT inhaler Inhale 2 puffs into the lungs 4 (four) times daily as needed. 02/10/22   [provider]  azithromycin  (ZITHROMAX ) 250 MG tablet Take 1 tablet (250 mg total) by mouth daily. Take first 2 tablets together, then 1 every day until finished. Patient not taking: Reported on 10/02/2022 09/17/22   Idol, Julie, PA-C  Buprenorphine  HCl-Naloxone  HCl 12-3 MG FILM Place 1 strip under the tongue 2 (two) times daily. 11/15/21   [provider]   ibuprofen  (ADVIL ) 200 MG tablet Take 800 mg by mouth 2 (two) times daily as needed. Taken once or twice a day for pain    [provider]  ipratropium-albuterol  (DUONEB) 0.5-2.5 (3) MG/3ML SOLN Take 3 mLs by nebulization every 6 (six) hours as needed (shortness of breath).    [provider]  NEEDLE, DISP, 18 G 18G X 1-1/2 MISC Use to draw up testosterone  for injection. 08/15/23   Matilda Senior, MD  predniSONE  (DELTASONE ) 10 MG tablet 6, 5, 4, 3, 2 then 1 tablet by mouth daily for 6 days total. Patient not taking: Reported on 10/02/2022 09/17/22   Idol, Julie, PA-C  SYRINGE-NEEDLE, DISP, 3 ML (SAFETY SYRINGE/NEEDLE) 22G X 1-1/2 3 ML MISC Use to inject testosterone . 08/15/23   Matilda Senior, MD  testosterone  cypionate (DEPOTESTOSTERONE CYPIONATE) 200 MG/ML injection Inject 0.5 mLs (100 mg total) into the muscle every 7 (seven) days. 08/13/23   Matilda Senior, MD      Allergies    No known allergies and Other    Review of Systems   Review of Systems  Constitutional:  Negative for appetite change and fever.  Respiratory:  Negative for cough and shortness of breath.   Cardiovascular:  Positive for chest pain (chest discomfort).  Gastrointestinal:  Negative for abdominal pain, nausea and vomiting.  Musculoskeletal:  Negative for arthralgias and back pain.  Neurological:  Negative for dizziness, syncope, weakness, numbness  and headaches.    Physical Exam Updated Vital Signs BP (!) 154/98 (BP Location: Right Arm)   Pulse 69   Temp 99.1 F (37.3 C) (Oral)   Resp 18   Ht 5' 10 (1.778 m)   Wt 103.9 kg   SpO2 97%   BMI 32.86 kg/m  Physical Exam Vitals and nursing [name] reviewed.  Constitutional:      General: He is not in acute distress.    Appearance: Normal appearance. He is not toxic-appearing.  Cardiovascular:     Rate and Rhythm: Normal rate and regular rhythm.     Pulses: Normal pulses.  Pulmonary:     Effort: Pulmonary effort is normal.  Chest:      Chest wall: No tenderness.  Abdominal:     Palpations: Abdomen is soft.     Tenderness: There is no abdominal tenderness.  Musculoskeletal:        General: Normal range of motion.  Skin:    General: Skin is warm.     Capillary Refill: Capillary refill takes less than 2 seconds.  Neurological:     General: No focal deficit present.     Mental Status: He is alert.     Sensory: No sensory deficit.     Motor: No weakness.     ED Results / Procedures / Treatments   Labs (all labs ordered are listed, but only abnormal results are displayed) Labs Reviewed  BASIC METABOLIC PANEL - Abnormal; Notable for the following components:      Result Value   Sodium 134 (*)    Glucose, Bld 140 (*)    All other components within normal limits  CBC  TROPONIN I (HIGH SENSITIVITY)  TROPONIN I (HIGH SENSITIVITY)    EKG EKG Interpretation Date/Time:  Tuesday January 06 2024 11:49:45 EST Ventricular Rate:  75 PR Interval:  156 QRS Duration:  92 QT Interval:  374 QTC Calculation: 417 R Axis:   7  Text Interpretation: Normal sinus rhythm Normal ECG When compared with ECG of 03-Jan-2024 06:50, PREVIOUS ECG IS PRESENT unchnaged from 3 days ago Confirmed by Cleotilde Rogue ([id number]) on 01/06/2024 11:52:15 AM  Radiology DG Chest 2 View Result Date: 01/06/2024 CLINICAL DATA:  Chest pain radiating to left arm for 3 days. EXAM: CHEST - 2 VIEW COMPARISON:  01/03/2024 FINDINGS: The heart size and mediastinal contours are within normal limits. Both lungs are clear. The visualized skeletal structures are unremarkable. IMPRESSION: No active cardiopulmonary disease. Electronically Signed   By: Norleen DELENA Kil M.D.   On: 01/06/2024 12:03    Procedures Procedures    Medications Ordered in ED Medications - No data to display  ED Course/ Medical Decision Making/ A&P                                 Medical Decision Making Patient here for evaluation of onset of chest discomfort earlier today after becoming  upset at his job.  Endorses increased stressors recently.  He was seen here on 01/03/2024 for chest pain after using cocaine.  Today's chest pain described as discomfort of his left chest with intermittent sharp stabbing pains through his left upper arm.  No other associated symptoms.  Denies any continued cocaine use.  Was recommended to follow-up with his cardiologist but not able to get an appointment thus far, so he came here for evaluation today.  Differential includes but not limited to ACS, anxiety reaction, PE,  musculoskeletal.  On review of previous medical record, patient was noted to have elevated troponins but delta was trending downward and troponins were flat.  He describes having a different pain today that was more like discomfort of his chest.  Pain minimal at present.  Amount and/or Complexity of Data Reviewed Labs: ordered.    Details: Labs interpreted by me troponin reassuring here and delta unchanged.  Chemistries without significant derangement, no evidence of leukocytosis, Radiology: ordered.    Details: Chest x-ray without acute cardiopulmonary process ECG/medicine tests: ordered.    Details: EKG shows sinus rhythm and EKG is unchanged from 3 days ago. Discussion of management or test interpretation with external provider(s): On recheck, patient resting comfortably.  Mildly hypertensive, no tachycardia tachypnea or hypoxia.  Patient felt to be appropriate for discharge home, had lengthy discussion regarding lifestyle changes and proper diet, smoking cessation and exercise.  Patient has seen Dr. Alvan for cardiology in the past and prefers to follow-up with him.  Stressed importance of close follow-up for this week as he may benefit from stress test.  Patient agreeable to plan.  Appears appropriate for discharge home, all questions were answered and strict return precautions were also given.           Final Clinical Impression(s) / ED Diagnoses Final diagnoses:   Nonspecific chest pain    Rx / DC Orders ED Discharge Orders     None         Herlinda Milling, PA-C 01/06/24 1540    Cleotilde Rogue, MD 01/06/24 1932

## 2024-01-07 ENCOUNTER — Telehealth: Payer: Self-pay | Admitting: Cardiology

## 2024-01-07 ENCOUNTER — Encounter: Payer: Self-pay | Admitting: *Deleted

## 2024-01-07 ENCOUNTER — Other Ambulatory Visit: Payer: BC Managed Care – PPO

## 2024-01-07 ENCOUNTER — Telehealth: Payer: Self-pay | Admitting: *Deleted

## 2024-01-07 ENCOUNTER — Telehealth: Payer: Self-pay

## 2024-01-07 DIAGNOSIS — R7989 Other specified abnormal findings of blood chemistry: Secondary | ICD-10-CM

## 2024-01-07 MED ORDER — TESTOSTERONE CYPIONATE 200 MG/ML IM SOLN: 100.0000 mg | INTRAMUSCULAR | 1 refills | Status: DC

## 2024-01-07 NOTE — Patient Outreach (Signed)
 Care Coordination   Initial Visit Note   01/07/2024 Name: John J Starry Jr. MRN: [id number] DOB: [birth year]  John J Napoles Jr. is a 48 y.o. year old male who sees Fanta, Benita Area, MD for primary care. I spoke with  John J Kaelin Jr. by phone today.  John [name] was given information about Complex Care Management services today including:   The Complex Care Management services include support from the care team which includes your Nurse Coordinator, Clinical Social Worker, or Pharmacist.  The Complex Care Management team is here to help remove barriers to the health concerns and goals most important to you. Complex Care Management services are voluntary, and the patient may decline or stop services at any time by request to their care team member.   Complex Care Management Consent Status: Patient agreed to services and verbal consent obtained.   What matters to the patients health and wellness today?  Managing his overall health so that he can live a healthy life for his children.     Goals Addressed             This Visit's Progress    Care Management Needs       Care Management Goals: Patient will keep appointment with cardiologist on 01/26/24 for ED follow-up Patient will reach out to cardiologist or seek medical attention for any new or worsening symptoms Patient will keep appointment with endcronilogist on 02/06/24 Patient will follow a carb modified diet Eat 3 meals per day with 30 grams of carbohydrates. Limit snacking and eliminate sugary foods and drinks. Up to 2 snacks per day, if needed, with less than 15 grams of carbohydrates Patient will review handout on meal planning and carbohydrate counting Patient will reach out to RN Care Manager at [phone] with any resource or care management needs        SDOH assessments and interventions completed:  Yes    SDOH Interventions Today    Flowsheet Row Most Recent Value  SDOH Interventions   Food  Insecurity Interventions Intervention Not Indicated  Housing Interventions Intervention Not Indicated  Transportation Interventions Intervention Not Indicated  Utilities Interventions Intervention Not Indicated  Financial Strain Interventions Intervention Not Indicated  Health Literacy Interventions Intervention Not Indicated      Care Coordination Interventions:  Yes, provided  Interventions Today    Flowsheet Row Most Recent Value  Chronic Disease   Chronic disease during today's visit Other  [2 recent ED visits for CP. Elevated fasting blood sugar and elevated A1C. No formal diagnosis of diabetes.]  General Interventions   General Interventions Discussed/Reviewed General Interventions Discussed, General Interventions Reviewed, Labs, Durable Medical Equipment (DME), Doctor Visits  Labs Hgb A1c every 3 months, Kidney Function  [prolactin levels]  Doctor Visits Discussed/Reviewed Doctor Visits Discussed, Doctor Visits Reviewed, PCP, Specialist  Durable Medical Equipment (DME) Glucomoter  [uses son's glucometer to check blood sugar at times]  PCP/Specialist Visits Compliance with follow-up visit  [Dr Dahlstedt (urology) on 01/13/24, Almarie Crate, NP (cardiology) on 01/26/24, Dr Lenis (endocrinology) on 02/06/24 New PT. visit for elevated prolactin. Added Appt. notes Re: elevated FBS and A1C]  Exercise Interventions   Exercise Discussed/Reviewed Physical Activity  Physical Activity Discussed/Reviewed Physical Activity Discussed, Physical Activity Reviewed  [able to perform ADLs. Works daily. No extra exercise presently.]  Education Interventions   Education Provided Provided Printed Education, Provided Education  [printed education on meal planning]  Provided Verbal Education On Nutrition, Labs, Exercise, Medication, When to see the doctor  Labs  Reviewed Hgb A1c  [11/06/22 A1C 6.5%.  FBS of 194 at ED on 01/03/24. Needs to be evaluated for DM.]  Nutrition Interventions   Nutrition  Discussed/Reviewed Nutrition Discussed, Nutrition Reviewed, Carbohydrate meal planning, Adding fruits and vegetables, Increasing proteins, Portion sizes, Decreasing sugar intake  [3 meals per day with 30 GM of CHO and up to 2 snacks per day, if needed, with less than 15 GM of CHO.]  Pharmacy Interventions   Pharmacy Dicussed/Reviewed Pharmacy Topics Discussed, Pharmacy Topics Reviewed, Medications and their functions  Advanced Directive Interventions   Advanced Directives Discussed/Reviewed Advanced Directives Reviewed  [None on file. Will discuss at next telephone visit.]       Follow up plan: Follow up call scheduled for 01/13/24    Encounter Outcome:  Patient Visit Completed   Josette Pellet, RN, BSN Care Manager [place]  [facility]  Population Health  Direct Dial: [phone] Main #: [phone]

## 2024-01-07 NOTE — Telephone Encounter (Signed)
 Patient is needing refill  testosterone cypionate (DEPOTESTOSTERONE CYPIONATE) 200 MG/ML injection   Pharmacy:  [facility] [phone] - [place], [place] - [address] AT [facility] [address] Phone: [phone]  Fax: [phone]

## 2024-01-07 NOTE — Telephone Encounter (Signed)
 Reviewed cardiology note, was to return last December and no-showed apt . He has been seen twice in the past week at [facility] on  1/4 and 1/7, discharged home.   We have made the next available apt in the [place] office for 01/26/24 at 2:30 pm with E.Peck,NP    I spoke with patient and he is agreeable to follow up.

## 2024-01-07 NOTE — Telephone Encounter (Signed)
   Pt c/o of Chest Pain: STAT if active CP, including tightness, pressure, jaw pain, radiating pain to shoulder/upper arm/back, CP unrelieved by Nitro. Symptoms reported of SOB, nausea, vomiting, sweating.  1. Are you having CP right now? no    2. Are you experiencing any other symptoms (ex. SOB, nausea, vomiting, sweating)? Dizzy, lightheaded, headache   3. Is your CP continuous or coming and going? Coming and going   4. Have you taken Nitroglycerin ? yes   5. How long have you been experiencing CP? Since sat. Pt went to ER on Sat and Tues    6. If NO CP at time of call then end call with telling Pt to call back or call 911 if Chest pain returns prior to return call from triage team.

## 2024-01-07 NOTE — Telephone Encounter (Signed)
Rx sent to pharmacy by MD

## 2024-01-08 LAB — TESTOSTERONE: Testosterone: 642 ng/dL (ref 264–916)

## 2024-01-08 LAB — HEMOGLOBIN AND HEMATOCRIT, BLOOD
Hematocrit: 49.7 % (ref 37.5–51.0)
Hemoglobin: 16.7 g/dL (ref 13.0–17.7)

## 2024-01-08 LAB — PSA: Prostate Specific Ag, Serum: 0.4 ng/mL (ref 0.0–4.0)

## 2024-01-09 ENCOUNTER — Encounter: Payer: Self-pay | Admitting: Urology

## 2024-01-12 ENCOUNTER — Other Ambulatory Visit: Payer: Self-pay | Admitting: Urology

## 2024-01-12 ENCOUNTER — Telehealth: Payer: Self-pay

## 2024-01-12 ENCOUNTER — Other Ambulatory Visit: Payer: Self-pay

## 2024-01-12 DIAGNOSIS — R7989 Other specified abnormal findings of blood chemistry: Secondary | ICD-10-CM

## 2024-01-12 DIAGNOSIS — Z7151 Drug abuse counseling and surveillance of drug abuser: Secondary | ICD-10-CM | POA: Diagnosis not present

## 2024-01-12 DIAGNOSIS — F1120 Opioid dependence, uncomplicated: Secondary | ICD-10-CM | POA: Diagnosis not present

## 2024-01-12 MED ORDER — TESTOSTERONE CYPIONATE 200 MG/ML IM SOLN: 100.0000 mg | INTRAMUSCULAR | 1 refills | Status: DC

## 2024-01-12 NOTE — Telephone Encounter (Signed)
 Yes, but EPIC wont allow me to refill controlled substances

## 2024-01-12 NOTE — Progress Notes (Signed)
 History of Present Illness: Here for f/u of low testosterone . He started on repletion (100 mg terstosterone cypionate IM q week) in mid August.  Recent labs--  Testosterone  level 642 (drawn on a Wednesday) PSA 0.4 Hemoglobin/hematocrit-16.7/49.7  He has missed some recent injections but overall tries to stay on schedule.  He is now injecting on Mondays.  Past Medical History:  Diagnosis Date   Anxiety    Bronchitis    COPD (chronic obstructive pulmonary disease) (HCC)    Depression    Opiate addiction (HCC)    Panic attacks    Pneumonia    Testicular torsion     Past Surgical History:  Procedure Laterality Date   APPENDECTOMY     EYE SURGERY     NASAL SINUS SURGERY     VASECTOMY      Home Medications:  Allergies as of 01/13/2024       Reactions   No Known Allergies    Other    No narcotics please        Medication List        Accurate as of January 12, 2024 11:16 AM. If you have any questions, ask your nurse or doctor.          albuterol  108 (90 Base) MCG/ACT inhaler Commonly known as: VENTOLIN  HFA Inhale 2 puffs into the lungs 4 (four) times daily as needed.   azithromycin  250 MG tablet Commonly known as: ZITHROMAX  Take 1 tablet (250 mg total) by mouth daily. Take first 2 tablets together, then 1 every day until finished.   Buprenorphine  HCl-Naloxone  HCl 12-3 MG Film Place 1 strip under the tongue 2 (two) times daily.   ibuprofen  200 MG tablet Commonly known as: ADVIL  Take 800 mg by mouth 2 (two) times daily as needed. Taken once or twice a day for pain   ipratropium-albuterol  0.5-2.5 (3) MG/3ML Soln Commonly known as: DUONEB Take 3 mLs by nebulization every 6 (six) hours as needed (shortness of breath).   NEEDLE (DISP) 18 G 18G X 1-1/2 Misc Use to draw up testosterone  for injection.   predniSONE  10 MG tablet Commonly known as: DELTASONE  6, 5, 4, 3, 2 then 1 tablet by mouth daily for 6 days total.   Safety Syringe/Needle 22G X 1-1/2 3 ML  Misc Generic drug: SYRINGE-NEEDLE (DISP) 3 ML Use to inject testosterone .   testosterone  cypionate 200 MG/ML injection Commonly known as: DEPOTESTOSTERONE CYPIONATE Inject 0.5 mLs (100 mg total) into the muscle every 7 (seven) days.        Allergies:  Allergies  Allergen Reactions   No Known Allergies    Other     No narcotics please    Family History  Problem Relation Age of Onset   Diabetes Mother    Hypertension Father    Cancer Other     Social History:  reports that he has been smoking cigarettes. He started smoking about 35 years ago. He has a 70.1 pack-year smoking history. He has never used smokeless tobacco. He reports current alcohol use. He reports that he does not currently use drugs after having used the following drugs: Cocaine and Marijuana.  ROS: A complete review of systems was performed.  All systems are negative except for pertinent findings as noted.  Physical Exam:  Vital signs in last 24 hours: There were no vitals taken for this visit. Constitutional:  Alert and oriented, No acute distress Cardiovascular: Regular rate  Respiratory: Normal respiratory effort Neurologic: Grossly intact, no focal deficits Psychiatric:  Normal mood and affect  I have reviewed prior pt notes  I have reviewed urinalysis results  I have reviewed prior prolactin, testosterone , psa results    Impression/Assessment:  Low testosterone , on repletion with excellent symptomatic response  High prolactin level--he has an appointment to see an endocrinologist within the next month  Plan:  He will continue the same injection schedule  Be sure to keep his endocrinology consultation  I will see back in 6 months following the labs which will hopefully be drawn the day after injection

## 2024-01-12 NOTE — Telephone Encounter (Signed)
 Just want to verify Rx was sent to pharmacy, there was a print out on the printer

## 2024-01-13 ENCOUNTER — Ambulatory Visit (INDEPENDENT_AMBULATORY_CARE_PROVIDER_SITE_OTHER): Payer: BC Managed Care – PPO | Admitting: Urology

## 2024-01-13 ENCOUNTER — Encounter: Payer: Self-pay | Admitting: *Deleted

## 2024-01-13 ENCOUNTER — Ambulatory Visit: Payer: Self-pay | Admitting: *Deleted

## 2024-01-13 VITALS — BP 119/78 | HR 88

## 2024-01-13 DIAGNOSIS — F1120 Opioid dependence, uncomplicated: Secondary | ICD-10-CM | POA: Diagnosis not present

## 2024-01-13 DIAGNOSIS — R6882 Decreased libido: Secondary | ICD-10-CM | POA: Diagnosis not present

## 2024-01-13 DIAGNOSIS — R7989 Other specified abnormal findings of blood chemistry: Secondary | ICD-10-CM | POA: Diagnosis not present

## 2024-01-13 DIAGNOSIS — F439 Reaction to severe stress, unspecified: Secondary | ICD-10-CM

## 2024-01-13 DIAGNOSIS — Z7151 Drug abuse counseling and surveillance of drug abuser: Secondary | ICD-10-CM | POA: Diagnosis not present

## 2024-01-13 NOTE — Patient Outreach (Signed)
  Care Coordination   Follow Up Visit Note   01/13/2024 Name: John J Swaziland Jr. MRN: [id number] DOB: 1976/04/13  John J Swaziland Jr. is a 48 y.o. year old male who sees Fanta, Benita Area, MD for primary care. I spoke with  John J Swaziland Jr. by phone today.  What matters to the patients health and wellness today?     SDOH assessments and interventions completed:  No     Care Coordination Interventions:  Yes, provided  Interventions Today    Flowsheet Row Most Recent Value  Chronic Disease   Chronic disease during today's visit Other  [2 recent ED visits for CP. Elevated fasting blood sugar and elevated A1C. Stress]  General Interventions   General Interventions Discussed/Reviewed General Interventions Discussed, General Interventions Reviewed, Doctor Visits  Doctor Visits Discussed/Reviewed Doctor Visits Discussed, Doctor Visits Reviewed, Specialist, PCP  Durable Medical Equipment (DME) Glucomoter  PCP/Specialist Visits Compliance with follow-up visit  Exercise Interventions   Exercise Discussed/Reviewed Physical Activity, Exercise Discussed, Exercise Reviewed  Physical Activity Discussed/Reviewed Physical Activity Discussed, Physical Activity Reviewed  Education Interventions   Education Provided Provided Education, Provided Printed Education  [printed nutrition information]  Provided Verbal Education On Nutrition, Labs, Blood Sugar Monitoring, When to see the doctor, Medication, Exercise  Labs Reviewed Hgb A1c  Mental Health Interventions   Mental Health Discussed/Reviewed Mental Health Discussed, Mental Health Reviewed, Substance Abuse, Anxiety, Refer to Social Work for counseling  [hx of substance abuse. Is in a support group and weekly counseling sessions for that. Has significant life stressors.]  Refer to Social Work for counseling regarding Anxiety/Coping  Nutrition Interventions   Nutrition Discussed/Reviewed Nutrition Discussed, Nutrition Reviewed, Carbohydrate  meal planning, Adding fruits and vegetables, Increasing proteins, Decreasing sugar intake, Portion sizes, Fluid intake  Pharmacy Interventions   Pharmacy Dicussed/Reviewed Pharmacy Topics Discussed, Pharmacy Topics Reviewed, Medications and their functions       Follow up plan: Follow up call scheduled for 02/03/24   Encounter Outcome:  Patient Visit Completed   Josette Pellet, RN, BSN [place]  [facility], [facility] RN Care Manager Direct Dial: [phone]

## 2024-01-14 DIAGNOSIS — F1120 Opioid dependence, uncomplicated: Secondary | ICD-10-CM | POA: Diagnosis not present

## 2024-01-14 DIAGNOSIS — Z7151 Drug abuse counseling and surveillance of drug abuser: Secondary | ICD-10-CM | POA: Diagnosis not present

## 2024-01-14 LAB — URINALYSIS, ROUTINE W REFLEX MICROSCOPIC
Bilirubin, UA: NEGATIVE
Glucose, UA: NEGATIVE
Ketones, UA: NEGATIVE
Leukocytes,UA: NEGATIVE
Nitrite, UA: NEGATIVE
Protein,UA: NEGATIVE
RBC, UA: NEGATIVE
Specific Gravity, UA: 1.01 (ref 1.005–1.030)
Urobilinogen, Ur: 0.2 mg/dL (ref 0.2–1.0)
pH, UA: 6 (ref 5.0–7.5)

## 2024-01-16 DIAGNOSIS — R2241 Localized swelling, mass and lump, right lower limb: Secondary | ICD-10-CM | POA: Diagnosis not present

## 2024-01-16 DIAGNOSIS — L03115 Cellulitis of right lower limb: Secondary | ICD-10-CM | POA: Diagnosis not present

## 2024-01-16 DIAGNOSIS — T50905A Adverse effect of unspecified drugs, medicaments and biological substances, initial encounter: Secondary | ICD-10-CM | POA: Diagnosis not present

## 2024-01-16 DIAGNOSIS — L0390 Cellulitis, unspecified: Secondary | ICD-10-CM | POA: Diagnosis not present

## 2024-01-16 NOTE — Telephone Encounter (Signed)
Call Pt to check on injection site concerns Pt says he administered shot himself Pt states he went to urgent care and they gave him something for infection

## 2024-01-16 NOTE — Telephone Encounter (Signed)
Patient did testosterone injection on Tuesday, injection site has a 4 inch knot on it, hot to the touch and leg is severely swollen please advise.

## 2024-01-20 DIAGNOSIS — Z7151 Drug abuse counseling and surveillance of drug abuser: Secondary | ICD-10-CM | POA: Diagnosis not present

## 2024-01-20 DIAGNOSIS — F1120 Opioid dependence, uncomplicated: Secondary | ICD-10-CM | POA: Diagnosis not present

## 2024-01-22 ENCOUNTER — Encounter: Payer: Self-pay | Admitting: *Deleted

## 2024-01-22 ENCOUNTER — Ambulatory Visit: Payer: Self-pay | Admitting: *Deleted

## 2024-01-23 NOTE — Patient Outreach (Signed)
Care Coordination   Initial Visit Note   01/23/2024 - Late Entry  Name: John J Swaziland Jr. MRN: [id number] DOB: [birth year]  John J Swaziland Jr. is a 48 y.o. year old male who sees Fanta, Wayland Salinas, MD for primary care. I spoke with John J Swaziland Jr. by phone today.  What matters to the patients health and wellness today?  Receive Counseling & Supportive Services to Reduce & Manage Symptoms of Anxiety & Stress.    Goals Addressed               This Visit's Progress     Receive Counseling & Supportive Services to Reduce & Manage Symptoms of Anxiety & Stress. (pt-stated)   On track     Care Coordination Interventions:  Interventions Today    Flowsheet Row Most Recent Value  Chronic Disease   Chronic disease during today's visit Other, Chronic Obstructive Pulmonary Disease (COPD)  [Anxiety, Depression, Opiate Addiction, Panic Attacks, Stress, 2 Recent Emergency Department Visits for Chest Pain, Elevated Fasting Blood Sugar, & Elevated A1C.]  General Interventions   General Interventions Discussed/Reviewed General Interventions Discussed, Labs, Vaccines, Annual Foot Exam, Health Screening, Sick Day Rules, Lipid Profile, General Interventions Reviewed, Annual Eye Exam, Durable Medical Equipment (DME), [employer], Doctor Visits, Communication with  [Encouraged Routine Engagement with Care Team Members & Providers.]  Labs Hgb A1c every 3 months, Kidney Function, Hgb A1c annually  [Encouraged Routine Lab Work.]  Vaccines COVID-19, Flu, Pneumonia, RSV, Shingles, Tetanus/Pertussis/Diphtheria  [Encouraged Routine Vaccinations.]  Doctor Visits Discussed/Reviewed Doctor Visits Discussed, Specialist, Doctor Visits Reviewed, Annual Wellness Visits, PCP  [Encouraged Routine Engagement with Care Team Members & Providers.]  Health Screening Bone Density, Colonoscopy, Prostate  [Encouraged Routine Health Screenings.]  Durable Medical Equipment (DME) Glucomoter, Other  [Scales,  Prescription Eyeglasses, Blood Pressure Cuff.]  PCP/Specialist Visits Compliance with follow-up visit  [Encouraged Routine Engagement with Care Team Members & Providers.]  Communication with PCP/Specialists, RN, Pharmacists, Social Work  [employer] Routine Engagement with Care Team Members & Providers.]  Exercise Interventions   Exercise Discussed/Reviewed Exercise Discussed, Assistive device use and maintanence, Exercise Reviewed, Physical Activity, Weight Managment  [Encouraged Daily Exercise Regimen, as Tolerated.]  Physical Activity Discussed/Reviewed Physical Activity Discussed, Home Exercise Program (HEP), PREP, Gym, Types of exercise, Physical Activity Reviewed  [Encouraged Increased Level of Activity & Exercise, Inside & Outside the Home.]  Weight Management Weight loss  [Encouraged Healthy Weight Loss Program.]  Education Interventions   Education Provided Provided Therapist, sports, Provided Web-based Education, Provided Education  [employer] Material & Encouraged Independent Review.]  Provided Engineer, petroleum On Nutrition, Mental Health/Coping with Illness, When to see the doctor, Sick Day Rules, [employer], Development worker, community, Medication, Blood Sugar Monitoring, Exercise, Applications, Foot Care, Eye Care, [employer] Reviewed Educational Material to [employer] & Entertain Questions.]  Labs Reviewed Hgb A1c  [Encouraged Routine Lab Checks & Logging Results.]  Mental Health Interventions   Mental Health Discussed/Reviewed Mental Health Discussed, Anxiety, Depression, Grief and Loss, Mental Health Reviewed, Substance Abuse, Coping Strategies, Other, Crisis, Suicide  [Assessed Mental Health & Cognitive Status.]  Nutrition Interventions   Nutrition Discussed/Reviewed Nutrition Discussed, Adding fruits and vegetables, Increasing proteins, Decreasing sugar intake, Portion sizes, Decreasing salt, Carbohydrate meal planning, Nutrition Reviewed, Fluid intake,  Decreasing fats  [Encouraged Heart-Healthy, Diabetic-Friendly, Low Sodium, Reduced Fat Diet.]  Pharmacy Interventions   Pharmacy Dicussed/Reviewed Pharmacy Topics Discussed, Medication Adherence, Pharmacy Topics Reviewed, Medications and their functions, Affording Medications  [Confirmed Compliance with Prescription Medications.]  Medication  Adherence --  [Confirmed Ability to [employer] Prescription Medications.]  Safety Interventions   Safety Discussed/Reviewed Safety Discussed, Safety Reviewed  [Encouraged Routine Use of Assistive Devices & Durable Medical Equipment.]  Advanced Directive Interventions   Advanced Directives Discussed/Reviewed Advanced Directives Discussed, Advanced Directives Reviewed  [Encouraged Initiation of Advanced Directives (Living Will & Healthcare Power of Corporate treasurer), Offering to [employer], Assist with Completion, Make Copies & Scan into Electronic Medical Record in Epic.]      Assessed Social Determinant of Health Barriers. Discussed Plans for Ongoing Care Management Follow Up. Provided Careers information officer Information for Care Management Team Members. Screened for Signs & Symptoms of Depression, Related to Chronic Disease State.  PHQ2 & PHQ9 Depression Screen Completed & Results Reviewed.  Suicidal Ideation & Homicidal Ideation Assessed - None Present.   Domestic Violence Assessed - None Present. Access to Weapons Assessed - None Present.   Active Listening & Reflection Utilized.  Verbalization of Feelings Encouraged.  Emotional Support Provided. Crisis Support Information, Agencies, Services, & Resources Discussed. Problem Solving Interventions Identified. Task-Centered Solutions Implemented.   Solution-Focused Strategies Developed. Acceptance & Commitment Therapy Introduced. Brief Cognitive Behavioral Therapy Initiated. Client-Centered Therapy Enacted. Encouraged Daily Implementation of Deep Breathing Exercises, Relaxation Techniques, & Mindfulness  Meditation Strategies, Providing Educational Material on Benefits & Proper Use. Encouraged Daily Journaling, As a Means of Expressing Thoughts, Feelings, Emotions, Symptoms, Behaviors, Patterns, Triggers, Etc. Reviewed Prescription Medications & Discussed Importance of Compliance. Quality of Sleep Assessed & Sleep Hygiene Techniques Promoted. Discussed Higher Level of Care Placement Options (I.e. Rest Home, Assisted Living, Extended Care, Skilled Nursing, Et.) & Confirmed Disinterest. Encouraged Review of Educational Material, Mailed on 01/22/2024, & Be Prepared to Discuss During Next Scheduled Follow-Up Outreach Call.  Encouraged Hospital Follow-Up Appointment with Dr. Avon Gully, Primary Care Provider [phone]), Offering to Assist Via 3-Way Call with Scheduler. Encouraged Routine Engagement with Danford Bad, Licensed Clinical Social Worker with [facility] [phone]), if You Have Questions, Need Assistance, or If Additional Social Work Needs Are Identified Between Now & Our Next Follow-Up Outreach Call, Scheduled on 02/05/2024 at 3:45 PM.          SDOH assessments and interventions completed:  Yes.  SDOH Interventions Today    Flowsheet Row Most Recent Value  SDOH Interventions   Food Insecurity Interventions Intervention Not Indicated  Housing Interventions Intervention Not Indicated  Transportation Interventions Intervention Not Indicated, Patient Resources (Friends/Family)  Utilities Interventions Intervention Not Indicated  Alcohol Usage Interventions Intervention Not Indicated (Score <7)  Depression Interventions/Treatment  Referral to Psychiatry, Medication, Counseling, Currently on Treatment, Community Resources Provided  Financial Strain Interventions Intervention Not Indicated  Physical Activity Interventions Community Resources Provided, Patient Declined  Stress Interventions Intervention Not Indicated  Social Connections Interventions  Intervention Not Indicated  Health Literacy Interventions Intervention Not Indicated     Care Coordination Interventions:  Yes, provided.   Follow up plan: Follow up call scheduled for 02/05/2024 at 3:45 pm.  Encounter Outcome:  Patient Visit Completed.    Danford Bad, BSW, MSW, LCSW [facility], [facility] Clinical Social Worker II Direct Dial: [phone]  Fax: [phone] Website: [email or web address]

## 2024-01-23 NOTE — Patient Instructions (Signed)
Visit Information  Thank you for taking time to visit with me today. Please don't hesitate to contact me if I can be of assistance to you.   Following are the goals we discussed today:   Goals Addressed               This Visit's Progress     Receive Counseling & Supportive Services to Reduce & Manage Symptoms of Anxiety & Stress. (pt-stated)   On track     Care Coordination Interventions:  Interventions Today    Flowsheet Row Most Recent Value  Chronic Disease   Chronic disease during today's visit Other, Chronic Obstructive Pulmonary Disease (COPD)  [Anxiety, Depression, Opiate Addiction, Panic Attacks, Stress, 2 Recent Emergency Department Visits for Chest Pain, Elevated Fasting Blood Sugar, & Elevated A1C.]  General Interventions   General Interventions Discussed/Reviewed General Interventions Discussed, Labs, Vaccines, Annual Foot Exam, Health Screening, Sick Day Rules, Lipid Profile, General Interventions Reviewed, Annual Eye Exam, Durable Medical Equipment (DME), [employer], Doctor Visits, Communication with  [Encouraged Routine Engagement with Care Team Members & Providers.]  Labs Hgb A1c every 3 months, Kidney Function, Hgb A1c annually  [Encouraged Routine Lab Work.]  Vaccines COVID-19, Flu, Pneumonia, RSV, Shingles, Tetanus/Pertussis/Diphtheria  [Encouraged Routine Vaccinations.]  Doctor Visits Discussed/Reviewed Doctor Visits Discussed, Specialist, Doctor Visits Reviewed, Annual Wellness Visits, PCP  [Encouraged Routine Engagement with Care Team Members & Providers.]  Health Screening Bone Density, Colonoscopy, Prostate  [Encouraged Routine Health Screenings.]  Durable Medical Equipment (DME) Glucomoter, Other  [Scales, Prescription Eyeglasses, Blood Pressure Cuff.]  PCP/Specialist Visits Compliance with follow-up visit  [Encouraged Routine Engagement with Care Team Members & Providers.]  Communication with PCP/Specialists, RN, Pharmacists, Social Work  [employer]  Routine Engagement with Care Team Members & Providers.]  Exercise Interventions   Exercise Discussed/Reviewed Exercise Discussed, Assistive device use and maintanence, Exercise Reviewed, Physical Activity, Weight Managment  [Encouraged Daily Exercise Regimen, as Tolerated.]  Physical Activity Discussed/Reviewed Physical Activity Discussed, Home Exercise Program (HEP), PREP, Gym, Types of exercise, Physical Activity Reviewed  [Encouraged Increased Level of Activity & Exercise, Inside & Outside the Home.]  Weight Management Weight loss  [Encouraged Healthy Weight Loss Program.]  Education Interventions   Education Provided Provided Therapist, sports, Provided Web-based Education, Provided Education  [employer] Material & Encouraged Independent Review.]  Provided Engineer, petroleum On Nutrition, Mental Health/Coping with Illness, When to see the doctor, Sick Day Rules, [employer], Development worker, community, Medication, Blood Sugar Monitoring, Exercise, Applications, Foot Care, Eye Care, [employer] Reviewed Educational Material to [employer] & Entertain Questions.]  Labs Reviewed Hgb A1c  [Encouraged Routine Lab Checks & Logging Results.]  Mental Health Interventions   Mental Health Discussed/Reviewed Mental Health Discussed, Anxiety, Depression, Grief and Loss, Mental Health Reviewed, Substance Abuse, Coping Strategies, Other, Crisis, Suicide  [Assessed Mental Health & Cognitive Status.]  Nutrition Interventions   Nutrition Discussed/Reviewed Nutrition Discussed, Adding fruits and vegetables, Increasing proteins, Decreasing sugar intake, Portion sizes, Decreasing salt, Carbohydrate meal planning, Nutrition Reviewed, Fluid intake, Decreasing fats  [Encouraged Heart-Healthy, Diabetic-Friendly, Low Sodium, Reduced Fat Diet.]  Pharmacy Interventions   Pharmacy Dicussed/Reviewed Pharmacy Topics Discussed, Medication Adherence, Pharmacy Topics Reviewed, Medications and their functions,  Affording Medications  [Confirmed Compliance with Prescription Medications.]  Medication Adherence --  [Confirmed Ability to [employer] Prescription Medications.]  Safety Interventions   Safety Discussed/Reviewed Safety Discussed, Safety Reviewed  [Encouraged Routine Use of Assistive Devices & Durable Medical Equipment.]  Advanced Directive Interventions   Advanced Directives Discussed/Reviewed Advanced Directives Discussed, Advanced  Directives Reviewed  [Encouraged Initiation of Advanced Directives (Living Will & Healthcare Power of Attorney Documents), Offering to [employer], Assist with Completion, Make Copies & Scan into Electronic Medical Record in Epic.]      Assessed Social Determinant of Health Barriers. Discussed Plans for Ongoing Care Management Follow Up. Provided Careers information officer Information for Care Management Team Members. Screened for Signs & Symptoms of Depression, Related to Chronic Disease State.  PHQ2 & PHQ9 Depression Screen Completed & Results Reviewed.  Suicidal Ideation & Homicidal Ideation Assessed - None Present.   Domestic Violence Assessed - None Present. Access to Weapons Assessed - None Present.   Active Listening & Reflection Utilized.  Verbalization of Feelings Encouraged.  Emotional Support Provided. Crisis Support Information, Agencies, Services, & Resources Discussed. Problem Solving Interventions Identified. Task-Centered Solutions Implemented.   Solution-Focused Strategies Developed. Acceptance & Commitment Therapy Introduced. Brief Cognitive Behavioral Therapy Initiated. Client-Centered Therapy Enacted. Encouraged Daily Implementation of Deep Breathing Exercises, Relaxation Techniques, & Mindfulness Meditation Strategies, Providing Educational Material on Benefits & Proper Use. Encouraged Daily Journaling, As a Means of Expressing Thoughts, Feelings, Emotions, Symptoms, Behaviors, Patterns, Triggers, Etc. Reviewed Prescription Medications & Discussed  Importance of Compliance. Quality of Sleep Assessed & Sleep Hygiene Techniques Promoted. Discussed Higher Level of Care Placement Options (I.e. Rest Home, Assisted Living, Extended Care, Skilled Nursing, Et.) & Confirmed Disinterest. Encouraged Review of Educational Material, Mailed on 01/22/2024, & Be Prepared to Discuss During Next Scheduled Follow-Up Outreach Call.  Encouraged Hospital Follow-Up Appointment with Dr. Avon Gully, Primary Care Provider [phone]), Offering to Assist Via 3-Way Call with Scheduler. Encouraged Routine Engagement with Danford Bad, Licensed Clinical Social Worker with [facility] [phone]), if You Have Questions, Need Assistance, or If Additional Social Work Needs Are Identified Between Now & Our Next Follow-Up Outreach Call, Scheduled on 02/05/2024 at 3:45 PM.        Our next appointment is by telephone on 02/05/2024 at 3:45 pm.  Please call the care guide team at [phone] if you need to cancel or reschedule your appointment.   If you are experiencing a Mental Health or Behavioral Health Crisis or need someone to talk to, please call the Suicide and Crisis Lifeline: 988 call the [place] National Suicide Prevention Lifeline: (586)277-2700 or TTY: 406-512-5452 TTY 450-564-2324) to talk to a trained counselor call 1-800-273-TALK (toll free, 24 hour hotline) go to [facility] [address] [place] [phone]) call the [facility] Crisis Line: [phone] call 911  Patient verbalizes understanding of instructions and care plan provided today and agrees to view in MyChart. Active MyChart status and patient understanding of how to access instructions and care plan via MyChart confirmed with patient.     Telephone follow up appointment with care management team member scheduled for:  02/05/2024 at 3:45 pm.   Danford Bad, BSW, MSW, LCSW [facility],  [facility] Clinical Social Worker II Direct Dial: [phone]  Fax: [phone] Website: [email or web address]

## 2024-01-26 ENCOUNTER — Encounter: Payer: Self-pay | Admitting: Nurse Practitioner

## 2024-01-26 ENCOUNTER — Ambulatory Visit: Payer: BC Managed Care – PPO | Attending: Nurse Practitioner | Admitting: Nurse Practitioner

## 2024-01-26 VITALS — BP 130/80 | HR 80 | Ht 70.0 in | Wt 242.0 lb

## 2024-01-26 DIAGNOSIS — R079 Chest pain, unspecified: Secondary | ICD-10-CM

## 2024-01-26 DIAGNOSIS — R030 Elevated blood-pressure reading, without diagnosis of hypertension: Secondary | ICD-10-CM

## 2024-01-26 DIAGNOSIS — Z720 Tobacco use: Secondary | ICD-10-CM | POA: Diagnosis not present

## 2024-01-26 DIAGNOSIS — E669 Obesity, unspecified: Secondary | ICD-10-CM

## 2024-01-26 DIAGNOSIS — F1911 Other psychoactive substance abuse, in remission: Secondary | ICD-10-CM | POA: Diagnosis not present

## 2024-01-26 MED ORDER — METOPROLOL TARTRATE 100 MG PO TABS: ORAL_TABLET | ORAL | 0 refills | Status: AC

## 2024-01-26 MED ORDER — NITROGLYCERIN 0.4 MG SL SUBL: 0.4000 mg | SUBLINGUAL_TABLET | SUBLINGUAL | 3 refills | Status: AC | PRN

## 2024-01-26 NOTE — Patient Instructions (Addendum)
Medication Instructions:  Your physician has recommended you make the following change in your medication:  The proper use and anticipated side effects of nitroglycerine has been carefully explained.  If a single episode of chest pain is not relieved by one tablet, the patient will try another within 5 minutes; and if this doesn't relieve the pain, the patient is instructed to call 911 for transportation to an emergency department.  Labwork: None   Testing/Procedures: Non-Cardiac CT Angiography (CTA), is a special type of CT scan that uses a computer to produce multi-dimensional views of major blood vessels throughout the body. In CT angiography, a contrast material is injected through an IV to help visualize the blood vessels Your physician has requested that you have an echocardiogram. Echocardiography is a painless test that uses sound waves to create images of your heart. It provides your doctor with information about the size and shape of your heart and how well your heart's chambers and valves are working. This procedure takes approximately one hour. There are no restrictions for this procedure. Please do NOT wear cologne, perfume, aftershave, or lotions (deodorant is allowed). Please arrive 15 minutes prior to your appointment time.  Please note: We ask at that you not bring children with you during ultrasound (echo/ vascular) testing. Due to room size and safety concerns, children are not allowed in the ultrasound rooms during exams. Our front office staff cannot provide observation of children in our lobby area while testing is being conducted. An adult accompanying a patient to their appointment will only be allowed in the ultrasound room at the discretion of the ultrasound technician under special circumstances. We apologize for any inconvenience.   Your cardiac CT will be scheduled at one of the below locations:   [facility] [address] [place], [place]  [address] [phone]  If scheduled at [facility], please arrive at the [facility] and Children's Entrance (Entrance C2) of [facility] 30 minutes prior to test start time. You can use the FREE valet parking offered at entrance C (encouraged to control the heart rate for the test)  Proceed to the [facility] Radiology Department (first floor) to check-in and test prep.  All radiology patients and guests should use entrance C2 at [facility], accessed from [facility], even though the hospital's physical address listed is [address].    Please follow these instructions carefully (unless otherwise directed):  An IV will be required for this test and Nitroglycerin will be given.  Hold all erectile dysfunction medications at least 3 days (72 hrs) prior to test. (Ie viagra, cialis, sildenafil, tadalafil, etc)   On the Night Before the Test: Be sure to Drink plenty of water. Do not consume any caffeinated/decaffeinated beverages or chocolate 12 hours prior to your test. Do not take any antihistamines 12 hours prior to your test.  On the Day of the Test: Drink plenty of water until 1 hour prior to the test. Do not eat any food 1 hour prior to test. You may take your regular medications prior to the test.  Take (1) 100 mg metoprolol (Lopressor) two hours prior to test.      After the Test: Drink plenty of water. After receiving IV contrast, you may experience a mild flushed feeling. This is normal. On occasion, you may experience a mild rash up to 24 hours after the test. This is not dangerous. If this occurs, you can take Benadryl 25 mg and  increase your fluid intake. If you experience trouble breathing, this can be serious. If it is severe call 911 IMMEDIATELY. If it is mild, please call our office.  We will call to schedule your test 2-4 weeks out understanding that some insurance companies will need an authorization prior to the service being  performed.   For more information and frequently asked questions, please visit our website : [email or web address]  For non-scheduling related questions, please contact the cardiac imaging nurse navigator should you have any questions/concerns: Cardiac Imaging Nurse Navigators Direct Office Dial: [phone]   For scheduling needs, including cancellations and rescheduling, please call [place], [phone].  Follow-Up: Your physician recommends that you schedule a follow-up appointment in: 6 weeks   Any Other Special Instructions Will Be Listed Below (If Applicable).  If you need a refill on your cardiac medications before your next appointment, please call your pharmacy.

## 2024-01-26 NOTE — Progress Notes (Unsigned)
Cardiology Office Note:  .   Date: 01/26/2024 ID:  John J Swaziland Jr., DOB Jun 09, 1976, MRN [id number] PCP: Benetta Spar, MD  [facility] Providers Cardiologist:  Dina Rich, MD    History of Present Illness: .   John J Swaziland Jr. is a 48 y.o. male with a PMH of chest pain, palpitations, and anxiety/depression, substance abuse, COPD, who presents today for ED follow-up.  Last seen by Dr. Dina Rich on June 03, 2022.  Patient had noticed some chest pain and palpitations.  He also was showing signs and symptoms of OSA.  He was referred to pulmonary for OSA evaluation.  ED visit at [facility] on January 03, 2024 for chest pain.  He was using cocaine that morning, shortly after he started feeling weird sensation in his chest as well as pressure sensation, tingling in his extremities.  Denied diaphoresis or shortness of breath.  Workup revealed WBC elevation.  Mildly elevated troponins.  CXR revealed bronchitic changes.  Patient became pain-free, troponins were decreasing.  Was told to follow-up with outpatient cardiology.  He returned to the ED on January 06, 2024 with recurrent chest pain, pain was less severe than previous ED visit.  He admitted to increase stressors and was upset around the time the chest pain began.  Workup was overall unremarkable.  Was recommended to follow-up with outpatient cardiology.  He recently contacted our office noting intermittent chest pain as well as associated symptoms including dizziness, lightheadedness and headache.  He noted that he went to the ED prior.  He says his episodes of chest pain all started ever since he relapsed and did cocaine prior to first episode of chest pain on January 03, 2024 that prompted him to go to the ED.  He says he has not used cocaine since.  Still admits to episodes of chest pain that typically occur at nighttime as he is winding down, says aspirin helps his symptoms. Difficult to tell what triggers his CP,  does wonder if some of his symptoms are related to anxiety/stress. He also admits to significant episodes of flatulence and has been taking GasX. Says he has stopped eating unhealthy foods and has changed his diet recently.  Denies any shortness of breath, palpitations, syncope, presyncope, dizziness, orthopnea, PND, swelling or significant weight changes, acute bleeding, or claudication.    FH: Does have a positive family history of heart disease.  Says his mom had 5 MIs with stents.  Dad is still living but smokes, says he has not had a heart attack.   SH: Smokes 1.5 PPD, planning to cut down and wean off his tobacco use.  Denies any alcohol use or other current illicit drug use. He is very active with his job. 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 He has 7 children. Ages range from 66 years old to two 47 year old twins.   ROS: Negative.  See HPI.  Studies Reviewed: .    Cardiac monitor 12/2021:  Zio Patch Extended out patient EKG monitoring 5 days starting 12/03/2021: Predominant underlying rhythm was sinus rhythm. First Degree AV Block was present. Second Degree AV Block-Mobitz I (Wenckebach) was present. Wenckebach was detected within +/- 45 seconds of symptomatic patient events. No higher degree AV Block noted.  Rare PACs and PVCs.  No atrial fibrillation.     Physical Exam:   VS:  BP 130/80   Pulse 80   Ht 5\' 10"  (1.778 m)   Wt 242 lb (  109.8 kg)   SpO2 95%   BMI 34.72 kg/m    Wt Readings from Last 3 Encounters:  01/26/24 242 lb (109.8 kg)  01/06/24 229 lb (103.9 kg)  01/03/24 229 lb 15 oz (104.3 kg)    GEN: Obese, 48 y.o. male in no acute distress NECK: No JVD; No carotid bruits CARDIAC: S1/S2, RRR, no murmurs, rubs, gallops RESPIRATORY:  Clear to auscultation without rales, wheezing or rhonchi  ABDOMEN: Soft, non-tender, non-distended EXTREMITIES:  No edema; No deformity   ASSESSMENT AND PLAN: .    Chest pain of uncertain etiology Admits to  episodes of chest pain that occur at night, unsure of what is triggering this, possibly stress/anxiety.  Denies any specific exertional angina.  Says this is happening at night while at rest.  Takes aspirin to help relieve episodes.  He does have family history of CAD.  Due to his multiple risk factors, discussed ischemic evaluation including CCTA and he verbalized understanding is agreeable to proceed.  Will prescribe one-time dose of metoprolol tartrate 100 mg to be taken 2 hours prior to testing.  Recent kidney function WNL.  Will prescribe nitroglycerin as needed for chest pain.  Will arrange Echo for further evaluation. Educated him on this medication and he verbalized understanding.  Care and ED precautions discussed. Heart healthy diet and regular cardiovascular exercise encouraged.   Elevated BP reading Borderline elevated BP on arrival today.  Discussed SBP goal is less than 130. Discussed to monitor BP at home at least 2 hours after medications and sitting for 5-10 minutes. No medication changes at this time.   Tobacco abuse, hx of cocaine use No longer using cocaine I congratulated him on quitting this.  Discussed risks of cocaine use. Smoking cessation encouraged and discussed.   Obesity Weight loss via diet and exercise encouraged. Discussed the impact being overweight would have on cardiovascular risk.   Dispo: Follow-up with me/APP in 6 weeks or sooner anything changes.  Signed, Sharlene Dory, NP

## 2024-01-27 ENCOUNTER — Other Ambulatory Visit: Payer: Self-pay | Admitting: Nurse Practitioner

## 2024-01-28 ENCOUNTER — Telehealth: Payer: Self-pay | Admitting: Cardiology

## 2024-01-28 DIAGNOSIS — Z13220 Encounter for screening for lipoid disorders: Secondary | ICD-10-CM

## 2024-01-28 NOTE — Telephone Encounter (Signed)
Ebony with Dr. Letitia Neri office states she is following up regarding a request for most recent lab work from Sharlene Dory, NP. However, Karel Jarvis would like to inform NP that their most recent labs are from 10/2022. Are they still needed? Please advise.

## 2024-01-28 NOTE — Telephone Encounter (Signed)
Milly Jakob that 2023 lab work isn't needed.

## 2024-01-29 NOTE — Addendum Note (Signed)
Addended by: Sharen Hones on: 01/29/2024 12:55 PM   Modules accepted: Orders

## 2024-01-29 NOTE — Telephone Encounter (Signed)
Spoke with patient he stated he would go have lab work done at [facility] lab orders sent for patient to have these done prior to next OV

## 2024-02-03 ENCOUNTER — Ambulatory Visit: Payer: Self-pay | Admitting: *Deleted

## 2024-02-03 ENCOUNTER — Encounter: Payer: Self-pay | Admitting: *Deleted

## 2024-02-03 DIAGNOSIS — Z7151 Drug abuse counseling and surveillance of drug abuser: Secondary | ICD-10-CM | POA: Diagnosis not present

## 2024-02-03 DIAGNOSIS — F1120 Opioid dependence, uncomplicated: Secondary | ICD-10-CM | POA: Diagnosis not present

## 2024-02-03 NOTE — Patient Outreach (Signed)
  Care Coordination   Follow Up Visit Note   02/03/2024 Name: John J Swaziland Jr. MRN: [id number] DOB: 1976/09/08  John J Swaziland Jr. is a 48 y.o. year old male who sees Fanta, Benita Area, MD for primary care. I spoke with  John J Swaziland Jr. by phone today.  What matters to the patients health and wellness today?     SDOH assessments and interventions completed:  No     Care Coordination Interventions:  Yes, provided   Follow up plan: Follow up call scheduled for 02/24/24   Encounter Outcome:  Patient Visit Completed   John Pellet, RN, BSN [place]  [facility], [facility] RN Care Manager Direct Dial: [phone]

## 2024-02-04 NOTE — Patient Instructions (Signed)
 Visit Information  Thank you for taking time to visit with me today. Please don't hesitate to contact me if I can be of assistance to you.   Following are the goals we discussed today:   Goals Addressed               This Visit's Progress     Receive Counseling & Supportive Services to Reduce & Manage Symptoms of Anxiety & Stress. (pt-stated)   On track     Care Coordination Interventions:  Interventions Today    Flowsheet Row Most Recent Value  Chronic Disease   Chronic disease during today's visit Other, Chronic Obstructive Pulmonary Disease (COPD)  [Anxiety, Depression, Opiate Addiction, Panic Attacks, Stress, 2 Recent Emergency Department Visits for Chest Pain, Elevated Fasting Blood Sugar, & Elevated A1C.]  General Interventions   General Interventions Discussed/Reviewed General Interventions Discussed, Labs, Vaccines, Annual Foot Exam, Health Screening, Sick Day Rules, Lipid Profile, General Interventions Reviewed, Annual Eye Exam, Durable Medical Equipment (DME), [employer], Doctor Visits, Communication with  [Encouraged Routine Engagement with Care Team Members & Providers.]  Labs Hgb A1c every 3 months, Kidney Function, Hgb A1c annually  [Encouraged Routine Lab Work.]  Vaccines COVID-19, Flu, Pneumonia, RSV, Shingles, Tetanus/Pertussis/Diphtheria  [Encouraged Routine Vaccinations.]  Doctor Visits Discussed/Reviewed Doctor Visits Discussed, Specialist, Doctor Visits Reviewed, Annual Wellness Visits, PCP  [Encouraged Routine Engagement with Care Team Members & Providers.]  Health Screening Bone Density, Colonoscopy, Prostate  [Encouraged Routine Health Screenings.]  Durable Medical Equipment (DME) Glucomoter, Other  [Scales, Prescription Eyeglasses, Blood Pressure Cuff.]  PCP/Specialist Visits Compliance with follow-up visit  [Encouraged Routine Engagement with Care Team Members & Providers.]  Communication with PCP/Specialists, RN, Pharmacists, Social Work  [employer]  Routine Engagement with Care Team Members & Providers.]  Exercise Interventions   Exercise Discussed/Reviewed Exercise Discussed, Assistive device use and maintanence, Exercise Reviewed, Physical Activity, Weight Managment  [Encouraged Daily Exercise Regimen, as Tolerated.]  Physical Activity Discussed/Reviewed Physical Activity Discussed, Home Exercise Program (HEP), PREP, Gym, Types of exercise, Physical Activity Reviewed  [Encouraged Increased Level of Activity & Exercise, Inside & Outside the Home.]  Weight Management Weight loss  [Encouraged Healthy Weight Loss Program.]  Education Interventions   Education Provided Provided Therapist, Sports, Provided Web-based Education, Provided Education  [employer] Material & Encouraged Independent Review.]  Provided Engineer, [employer] On Nutrition, Mental Health/Coping with Illness, When to see the doctor, Sick Day Rules, [employer], Development Worker, Community, Medication, Blood Sugar Monitoring, Exercise, Applications, Foot Care, Eye Care, [employer] Reviewed Educational Material to [employer] & Entertain Questions.]  Labs Reviewed Hgb A1c  [Encouraged Routine Lab Checks & Logging Results.]  Mental Health Interventions   Mental Health Discussed/Reviewed Mental Health Discussed, Anxiety, Depression, Grief and Loss, Mental Health Reviewed, Substance Abuse, Coping Strategies, Other, Crisis, Suicide  [Assessed Mental Health & Cognitive Status.]  Nutrition Interventions   Nutrition Discussed/Reviewed Nutrition Discussed, Adding fruits and vegetables, Increasing proteins, Decreasing sugar intake, Portion sizes, Decreasing salt, Carbohydrate meal planning, Nutrition Reviewed, Fluid intake, Decreasing fats  [Encouraged Heart-Healthy, Diabetic-Friendly, Low Sodium, Reduced Fat Diet.]  Pharmacy Interventions   Pharmacy Dicussed/Reviewed Pharmacy Topics Discussed, Medication Adherence, Pharmacy Topics Reviewed, Medications and their functions,  Affording Medications  [Confirmed Compliance with Prescription Medications.]  Medication Adherence --  [Confirmed Ability to [employer] Prescription Medications.]  Safety Interventions   Safety Discussed/Reviewed Safety Discussed, Safety Reviewed  [Encouraged Routine Use of Assistive Devices & Durable Medical Equipment.]  Advanced Directive Interventions   Advanced Directives Discussed/Reviewed Advanced Directives Discussed, Advanced  Directives Reviewed  [Encouraged Initiation of Advanced Directives (Living Will & Healthcare Power of Corporate Treasurer), Offering to [employer], Assist with Completion, Make Copies & Scan into Electronic Medical Record in Epic.]      Active Listening & Reflection Utilized.  Verbalization of Feelings Encouraged.  Emotional Support Provided. Problem Solving Interventions Activated. Task-Centered Solutions Employed.   Solution-Focused Strategies Indicated. Acceptance & Commitment Therapy Initiated. Brief Cognitive Behavioral Therapy Implemented. Client-Centered Therapy Performed. Thoroughly Reviewed Educational Materia, to Ensure Understanding & Entertain Questions.   Encouraged Hospital Follow-Up Appointment with Dr. Benita Outhouse, Primary Care Provider [phone]), Offering to Assist Via 3-Way Call with Scheduler. Encouraged Routine Engagement with Demetries Coia, Licensed Clinical Social Worker with [facility] [phone]), if You Have Questions, Need Assistance, or If Additional Social Work Needs Are Identified Between Now & Our Next Follow-Up Outreach Call, Scheduled on 02/23/2024 at 10:30 AM.        Our next appointment is by telephone on 02/23/2024 at 10:30 am.  Please call the care guide team at [phone] if you need to cancel or reschedule your appointment.   If you are experiencing a Mental Health or Behavioral Health Crisis or need someone to talk to, please call the Suicide and Crisis Lifeline: 988 call the [place]  National  Suicide Prevention Lifeline: 442-610-5739 or TTY: 786-707-7264 TTY (313)546-9850) to talk to a trained counselor call 1-800-273-TALK (toll free, 24 hour hotline) go to [facility] [address] [place] [phone]) call the [facility] Crisis Line: ([phone]) [phone] call 911  Patient verbalizes understanding of instructions and care plan provided today and agrees to view in MyChart. Active MyChart status and patient understanding of how to access instructions and care plan via MyChart confirmed with patient.     Telephone follow up appointment with care management team member scheduled for:  02/23/2024 at 10:30 am.   Philippe Desanctis, BSW, MSW, LCSW [place]  [facility], [facility] Clinical Social Worker II Direct Dial: [phone]  Fax: [phone] Website: [email or web address]

## 2024-02-04 NOTE — Patient Outreach (Signed)
 Care Coordination   Follow Up Visit Note   02/04/2024  Name: Wm J Gauer Jr. MRN: [id number] DOB: 10/16/1976  Malakhai J Hortman Jr. is a 48 y.o. year old male who sees Fanta, Benita Area, MD for primary care. I spoke with Girolamo J Hanlon Jr. by phone today.  What matters to the patients health and wellness today?  Receive Counseling & Supportive Services to Reduce & Manage Symptoms of Anxiety & Stress.    Goals Addressed               This Visit's Progress     Receive Counseling & Supportive Services to Reduce & Manage Symptoms of Anxiety & Stress. (pt-stated)   On track     Care Coordination Interventions:  Interventions Today    Flowsheet Row Most Recent Value  Chronic Disease   Chronic disease during today's visit Other, Chronic Obstructive Pulmonary Disease (COPD)  [Anxiety, Depression, Opiate Addiction, Panic Attacks, Stress, 2 Recent Emergency Department Visits for Chest Pain, Elevated Fasting Blood Sugar, & Elevated A1C.]  General Interventions   General Interventions Discussed/Reviewed General Interventions Discussed, Labs, Vaccines, Annual Foot Exam, Health Screening, Sick Day Rules, Lipid Profile, General Interventions Reviewed, Annual Eye Exam, Durable Medical Equipment (DME), [employer], Doctor Visits, Communication with  [Encouraged Routine Engagement with Care Team Members & Providers.]  Labs Hgb A1c every 3 months, Kidney Function, Hgb A1c annually  [Encouraged Routine Lab Work.]  Vaccines COVID-19, Flu, Pneumonia, RSV, Shingles, Tetanus/Pertussis/Diphtheria  [Encouraged Routine Vaccinations.]  Doctor Visits Discussed/Reviewed Doctor Visits Discussed, Specialist, Doctor Visits Reviewed, Annual Wellness Visits, PCP  [Encouraged Routine Engagement with Care Team Members & Providers.]  Health Screening Bone Density, Colonoscopy, Prostate  [Encouraged Routine Health Screenings.]  Durable Medical Equipment (DME) Glucomoter, Other  [Scales, Prescription  Eyeglasses, Blood Pressure Cuff.]  PCP/Specialist Visits Compliance with follow-up visit  [Encouraged Routine Engagement with Care Team Members & Providers.]  Communication with PCP/Specialists, RN, Pharmacists, Social Work  [employer] Routine Engagement with Care Team Members & Providers.]  Exercise Interventions   Exercise Discussed/Reviewed Exercise Discussed, Assistive device use and maintanence, Exercise Reviewed, Physical Activity, Weight Managment  [Encouraged Daily Exercise Regimen, as Tolerated.]  Physical Activity Discussed/Reviewed Physical Activity Discussed, Home Exercise Program (HEP), PREP, Gym, Types of exercise, Physical Activity Reviewed  [Encouraged Increased Level of Activity & Exercise, Inside & Outside the Home.]  Weight Management Weight loss  [Encouraged Healthy Weight Loss Program.]  Education Interventions   Education Provided Provided Therapist, Sports, Provided Web-based Education, Provided Education  [employer] Material & Encouraged Independent Review.]  Provided Engineer, [employer] Nutrition, Mental Health/Coping with Illness, When to see the doctor, Sick Day Rules, [employer], Development Worker, Community, Medication, Blood Sugar Monitoring, Exercise, Applications, Foot Care, Eye Care, [employer] Reviewed Educational Material to [employer] & Entertain Questions.]  Labs Reviewed Hgb A1c  [Encouraged Routine Lab Checks & Logging Results.]  Mental Health Interventions   Mental Health Discussed/Reviewed Mental Health Discussed, Anxiety, Depression, Grief and Loss, Mental Health Reviewed, Substance Abuse, Coping Strategies, Other, Crisis, Suicide  [Assessed Mental Health & Cognitive Status.]  Nutrition Interventions   Nutrition Discussed/Reviewed Nutrition Discussed, Adding fruits and vegetables, Increasing proteins, Decreasing sugar intake, Portion sizes, Decreasing salt, Carbohydrate meal planning, Nutrition Reviewed, Fluid intake, Decreasing fats   [Encouraged Heart-Healthy, Diabetic-Friendly, Low Sodium, Reduced Fat Diet.]  Pharmacy Interventions   Pharmacy Dicussed/Reviewed Pharmacy Topics Discussed, Medication Adherence, Pharmacy Topics Reviewed, Medications and their functions, Affording Medications  [Confirmed Compliance with Prescription Medications.]  Medication Adherence --  [  Confirmed Ability to Afford Prescription Medications.]  Safety Interventions   Safety Discussed/Reviewed Safety Discussed, Safety Reviewed  [Encouraged Routine Use of Assistive Devices & Durable Medical Equipment.]  Advanced Directive Interventions   Advanced Directives Discussed/Reviewed Advanced Directives Discussed, Advanced Directives Reviewed  [Encouraged Initiation of Advanced Directives (Living Will & Healthcare Power of Corporate Treasurer), Offering to [employer], Assist with Completion, Make Copies & Scan into Electronic Medical Record in Epic.]      Active Listening & Reflection Utilized.  Verbalization of Feelings Encouraged.  Emotional Support Provided. Problem Solving Interventions Activated. Task-Centered Solutions Employed.   Solution-Focused Strategies Indicated. Acceptance & Commitment Therapy Initiated. Brief Cognitive Behavioral Therapy Implemented. Client-Centered Therapy Performed. Thoroughly Reviewed Educational Materia, to Ensure Understanding & Entertain Questions.   Encouraged Hospital Follow-Up Appointment with Dr. Benita Outhouse, Primary Care Provider [phone]), Offering to Assist Via 3-Way Call with Scheduler. Encouraged Routine Engagement with Oprah Camarena, Licensed Clinical Social Worker with [facility] [phone]), if You Have Questions, Need Assistance, or If Additional Social Work Needs Are Identified Between Now & Our Next Follow-Up Outreach Call, Scheduled on 02/23/2024 at 10:30 AM.        SDOH assessments and interventions completed:  Yes.  Care Coordination Interventions:  Yes,  provided.   Follow up plan: Follow up call scheduled for 02/23/2024 at 10:30 am.  Encounter Outcome:  Patient Visit Completed.    Philippe Desanctis, BSW, MSW, LCSW [facility], [facility] Clinical Social Worker II Direct Dial: [phone]  Fax: [phone] Website: [email or web address]

## 2024-02-05 ENCOUNTER — Encounter: Payer: Self-pay | Admitting: *Deleted

## 2024-02-06 ENCOUNTER — Encounter: Payer: Self-pay | Admitting: "Endocrinology

## 2024-02-06 ENCOUNTER — Ambulatory Visit (INDEPENDENT_AMBULATORY_CARE_PROVIDER_SITE_OTHER): Payer: BC Managed Care – PPO | Admitting: "Endocrinology

## 2024-02-06 VITALS — BP 130/80 | HR 59 | Ht 70.0 in | Wt 244.8 lb

## 2024-02-06 DIAGNOSIS — E221 Hyperprolactinemia: Secondary | ICD-10-CM | POA: Diagnosis not present

## 2024-02-06 DIAGNOSIS — E038 Other specified hypothyroidism: Secondary | ICD-10-CM | POA: Insufficient documentation

## 2024-02-06 DIAGNOSIS — E119 Type 2 diabetes mellitus without complications: Secondary | ICD-10-CM | POA: Insufficient documentation

## 2024-02-06 DIAGNOSIS — E66812 Obesity, class 2: Secondary | ICD-10-CM | POA: Insufficient documentation

## 2024-02-06 DIAGNOSIS — E291 Testicular hypofunction: Secondary | ICD-10-CM | POA: Diagnosis not present

## 2024-02-06 DIAGNOSIS — Z6835 Body mass index (BMI) 35.0-35.9, adult: Secondary | ICD-10-CM

## 2024-02-06 NOTE — Progress Notes (Signed)
 Endocrinology Consult Note                                            02/06/2024, 8:57 AM   Subjective:    Patient ID: John J Clutter Jr., male    DOB: [birth year], PCP Carlette Benita Area, MD   Past Medical History:  Diagnosis Date   Anxiety    Bronchitis    COPD (chronic obstructive pulmonary disease) (HCC)    Depression    Opiate addiction (HCC)    Panic attacks    Pneumonia    Testicular torsion    Past Surgical History:  Procedure Laterality Date   APPENDECTOMY     EYE SURGERY     NASAL SINUS SURGERY     VASECTOMY     Social History   Socioeconomic History   Marital status: Married    Spouse name: Not on file   Number of children: Not on file   Years of education: Not on file   Highest education level: Not on file  Occupational History   Not on file  Tobacco Use   Smoking status: Every Day    Current packs/day: 2.00    Average packs/day: 2.0 packs/day for 35.1 years (70.2 ttl pk-yrs)    Types: Cigarettes    Start date: 12/1988    Passive exposure: Current   Smokeless tobacco: Never   Tobacco comments:    Offered Smoking Cessation Classes, Services, Agencies, & Resources. Offered Alcohol & Drug Treatment Program Classes, Services, Agencies, & Resources.  Vaping Use   Vaping status: Never Used  Substance and Sexual Activity   Alcohol use: Yes    Comment: occasional   Drug use: Not Currently    Types: Cocaine, Marijuana    Comment: takes suboxin   Sexual activity: Yes    Partners: Female  Other Topics Concern   Not on file  Social History Narrative   Not on file   Social Drivers of Health   Financial Resource Strain: Low Risk  (01/22/2024)   Overall Financial Resource Strain (CARDIA)    Difficulty of Paying Living Expenses: Not very hard  Food Insecurity: No Food Insecurity (01/22/2024)   Hunger Vital Sign    Worried About Running Out of Food in the Last Year: Never true    Ran Out of Food in the Last Year: Never true  Transportation  Needs: No Transportation Needs (01/22/2024)   PRAPARE - Administrator, Civil Service (Medical): No    Lack of Transportation (Non-Medical): No  Physical Activity: Inactive (01/22/2024)   Exercise Vital Sign    Days of Exercise per Week: 0 days    Minutes of Exercise per Session: 0 min  Stress: No Stress Concern Present (01/22/2024)   [employer] of Occupational Health - Occupational Stress Questionnaire    Feeling of Stress : Only a little  Social Connections: Unknown (01/22/2024)   Social Connection and Isolation Panel [NHANES]    Frequency of Communication with Friends and Family: More than three times a week    Frequency of Social Gatherings with Friends and Family: More than three times a week    Attends Religious Services: Patient declined    Database Administrator or Organizations: Yes    Attends Banker Meetings: More than 4 times per year    Marital Status:  Married   Family History  Problem Relation Age of Onset   Cancer Mother    Diabetes Mother    Heart attack Mother    Stroke Mother    Hypertension Father    Thyroid  disease Maternal Grandmother    Thyroid  disease Maternal Grandfather    Thyroid  disease Paternal Grandmother    Thyroid  disease Paternal Grandfather    Cancer Other    Outpatient Encounter Medications as of 02/06/2024  Medication Sig   albuterol  (VENTOLIN  HFA) 108 (90 Base) MCG/ACT inhaler Inhale 2 puffs into the lungs 4 (four) times daily as needed.   Buprenorphine  HCl-Naloxone  HCl 12-3 MG FILM Place 1 strip under the tongue 2 (two) times daily.   ibuprofen  (ADVIL ) 200 MG tablet Take 800 mg by mouth 2 (two) times daily as needed. Taken once or twice a day for pain   ipratropium-albuterol  (DUONEB) 0.5-2.5 (3) MG/3ML SOLN Take 3 mLs by nebulization every 6 (six) hours as needed (shortness of breath).   metoprolol  tartrate (LOPRESSOR ) 100 MG tablet Take (1) 100 Mg tablet 2 hours before procedure   NEEDLE, DISP, 18 G 18G X 1-1/2  MISC Use to draw up testosterone  for injection.   nitroGLYCERIN  (NITROSTAT ) 0.4 MG SL tablet Place 1 tablet (0.4 mg total) under the tongue every 5 (five) minutes as needed for chest pain.   SYRINGE-NEEDLE, DISP, 3 ML (SAFETY SYRINGE/NEEDLE) 22G X 1-1/2 3 ML MISC Use to inject testosterone .   testosterone  cypionate (DEPOTESTOSTERONE CYPIONATE) 200 MG/ML injection Inject 0.5 mLs (100 mg total) into the muscle every 7 (seven) days.   No facility-administered encounter medications on file as of 02/06/2024.   ALLERGIES: Allergies  Allergen Reactions   No Known Allergies    Other     No narcotics please    VACCINATION STATUS: Immunization History  Administered Date(s) Administered   Pneumococcal Polysaccharide-23 04/12/2016   Tdap 09/03/2016    HPI John Sebring Vasconcelos Jr. is 48 y.o. male who presents today with a medical history as above. he is being seen in consultation for hyperprolactinemia and A1c of 6.5% consistent with type 2 diabetes requested by Carlette Benita Area, MD.  History is obtained directly from the patient as well as chart review.  Patient with long and complicated medical history including substance abuse previously.  Proximately 4 months ago , he was found to have hypogonadism for which he is put on testosterone  50 mg IM weekly.  About the same time, he was also found to have hyperprolactinemia at 40.6. No repeat labs since September 2024.   He denies any prior history of pituitary, thyroid , parathyroid dysfunction. He is not on medication for diabetes.  He is only medications are buprenorphine , testosterone , and nitroglycerin . He denies any history of testicular injury, radiation, nor chemotherapy. He denies any prior history of AAS.  Undedicated to pituitary/sella CT head without contrast in 2023 showed subtle asymmetric hypodensity in the posterior lateral aspect of the right thalamus which may represent an acute infarct.  He also had mucosal thickening bilateral  maxillary sinuses indicating sinusitis.   He did not show gross pituitary lesions.  He is a chronic heavy smoker. He has family history of type 2 diabetes in his mother.  And his son has type 1 diabetes.   He also has family history of coronary artery disease, stroke, heart failure. He does not particularly follow any diet or exercise programs.  Review of Systems  Constitutional: +mildly fluctuating body weight ,  no fatigue, no subjective hyperthermia, no  subjective hypothermia Eyes: no blurry vision, no xerophthalmia ENT: no sore throat, no nodules palpated in throat, no dysphagia/odynophagia, no hoarseness Cardiovascular: no Chest Pain, no Shortness of Breath, no palpitations, no leg swelling Respiratory: no cough, no shortness of breath Gastrointestinal: no Nausea/Vomiting/Diarhhea Musculoskeletal: no muscle/joint aches Skin: no rashes Neurological: no tremors, no numbness, no tingling, no dizziness Psychiatric: no depression, no anxiety  Objective:       02/06/2024    8:23 AM 01/26/2024    2:20 PM 01/13/2024    3:47 PM  Vitals with BMI  Height 5' 10 5' 10   Weight 244 lbs 13 oz 242 lbs   BMI 35.13 34.72   Systolic 130 130 880  Diastolic 80 80 78  Pulse 59 80 88    BP 130/80 (BP Location: Right Arm, Patient Position: Sitting, Cuff Size: Large)   Pulse (!) 59   Ht 5' 10 (1.778 m)   Wt 244 lb 12.8 oz (111 kg)   BMI 35.13 kg/m   Wt Readings from Last 3 Encounters:  02/06/24 244 lb 12.8 oz (111 kg)  01/26/24 242 lb (109.8 kg)  01/06/24 229 lb (103.9 kg)    Physical Exam  Constitutional:  Body mass index is 35.13 kg/m.,  not in acute distress, normal state of mind Eyes: PERRLA, EOMI, no exophthalmos ENT: moist mucous membranes, no gross thyromegaly, no gross cervical lymphadenopathy Cardiovascular: normal precordial activity, Regular Rate and Rhythm, no Murmur/Rubs/Gallops Respiratory:  adequate breathing efforts, no gross chest deformity, Clear to auscultation  bilaterally Gastrointestinal: abdomen soft, Non -tender, No distension, Bowel Sounds present, no gross organomegaly Musculoskeletal: no gross deformities, strength intact in all four extremities, no peripheral edema Skin: moist, warm, no rashes Neurological: no tremor with outstretched hands, Deep tendon reflexes normal in bilateral lower extremities.  CMP ( most recent) CMP     Component Value Date/Time   NA 134 (L) 01/06/2024 1250   K 4.1 01/06/2024 1250   CL 100 01/06/2024 1250   CO2 24 01/06/2024 1250   GLUCOSE 140 (H) 01/06/2024 1250   BUN 11 01/06/2024 1250   CREATININE 0.81 01/06/2024 1250   CALCIUM 9.3 01/06/2024 1250   PROT 7.2 05/10/2021 2134   ALBUMIN 4.4 05/10/2021 2134   AST 29 05/10/2021 2134   ALT 47 (H) 05/10/2021 2134   ALKPHOS 87 05/10/2021 2134   BILITOT 0.7 05/10/2021 2134   GFRNONAA >60 01/06/2024 1250      Lab Results  Component Value Date   TSH 7.131 (H) 06/04/2021   FREET4 0.72 06/04/2021     Latest Reference Range & Units 08/06/23 15:08 09/23/23 08:30 01/03/24 06:55 01/06/24 12:50 01/07/24 13:33  Prolactin 3.9 - 22.7 ng/mL  40.6 (H)     Glucose 70 - 99 mg/dL   805 (H) 859 (H)   Testosterone  264 - 916 ng/dL 837 (L) 236   357  Testosterone  Free 6.8 - 21.5 pg/mL 3.8 (L)      (H): Data is abnormally high (L): Data is abnormally low  Assessment & Plan:   1. Hyperprolactinemia (HCC) (Primary) 2. Type 2 diabetes mellitus without complication, without long-term current use of insulin (HCC) 3. Hypogonadism, male 4.  Subclinical hypothyroidism 5.  Class 2 obesity  - John Herren Kube Jr.  is being seen at a kind request of Fanta, Tesfaye Demissie, MD. - I have reviewed his available endocrine records and clinically evaluated the patient. - Based on these reviews, he has hypogonadism on testosterone , hyperprolactinemia, type 2 diabetes, subclinical hypothyroidism,  class II obesity, chronic smoking on the background of previous substance abuse. -His  initial workup is not available to review.  He he is on ongoing treatment with testosterone , will be kept on same dose of testosterone  50 mg IM every 7 days until next measurement.  It is likely that hyperprolactinemia is related to his hypogonadism. Regarding hyperprolactinemia, previous brain imaging studies even though they were not dedicated for the pituitary/sella, did not show gross pituitary lesion.  He will be offered repeat measurement of prolactin along with gonadotropins before initiating treatment.  His previous labs also show subclinical hypothyroidism with high TSH.  In 3 months, he will return with repeat labs involving:  Prolactin - Comprehensive metabolic panel - Lipid panel - TSH - T4, free - PSA - Cortisol-am, blood - Testosterone , Free, Total, SHBG - CBC with Differential/Platelet - Luteinizing hormone - Follicle stimulating hormone - Ferritin  Regarding his type 2 diabetes: His referral package includes A1c of 6.5% from November 2023 consistent with type 2 diabetes .  He also had several documentations of hyperglycemia ranging between 117-201 in the last 7 years.  This patient will benefit from early intervention. - he acknowledges that there is a room for improvement in his food and drink choices. - Suggestion is made for him to avoid simple carbohydrates  from his diet including Cakes, Sweet Desserts, Ice Cream, Soda (diet and regular), Sweet Tea, Candies, Chips, Cookies, Store Bought Juices, Alcohol , Artificial Sweeteners,  Coffee Creamer, and Sugar-free Products, Lemonade. This will help patient to have more stable blood glucose profile and potentially avoid unintended weight gain.  He will have repeat A1c during his next visit.  A1c remains above 6.5%, he will be considered for metformin intervention.  The single most important intervention for him would be smoking cessation and achieving progressive weight loss.    30 to 60 minutes of moderate intensity exercise  was recommended.  Patient is also counseled on again smoking.   - he is advised to maintain close follow up with Fanta, Tesfaye Demissie, MD for primary care needs.   -Thank you for involving me in the care of this pleasant patient.  Time spent with the patient: 61 minutes, of which >50% was spent in  counseling him about his hyperprolactinemia, type 2 diabetes, hypogonadism and the rest in obtaining information about his symptoms, reviewing his previous labs/studies ( including abstractions from other facilities),  evaluations, and treatments,  and developing a plan to confirm diagnosis and long term treatment based on the latest standards of care/guidelines; and documenting his care.  John J Dimercurio Jr. participated in the discussions, expressed understanding, and voiced agreement with the above plans.  All questions were answered to his satisfaction. he is encouraged to contact clinic should he have any questions or concerns prior to his return visit.  Follow up plan: Return in about 3 months (around 05/05/2024) for Fasting Labs  in AM B4 8, A1c -NV.   Ranny Earl, MD [facility] [address] [place], [place] [address] Phone: [phone]  Fax: [phone]     02/06/2024, 8:57 AM  This note was partially dictated with voice recognition software. Similar sounding words can be transcribed inadequately or may not  be corrected upon review.

## 2024-02-06 NOTE — Patient Instructions (Signed)

## 2024-02-09 ENCOUNTER — Telehealth (HOSPITAL_COMMUNITY): Payer: Self-pay | Admitting: *Deleted

## 2024-02-09 ENCOUNTER — Other Ambulatory Visit: Payer: BC Managed Care – PPO

## 2024-02-09 NOTE — Telephone Encounter (Signed)
 Reaching out to patient to offer assistance regarding upcoming cardiac imaging study; pt verbalizes understanding of appt date/time, parking situation and where to check in, pre-test NPO status and medications ordered, and verified current allergies; name and call back number provided for further questions should they arise  Larey Brick RN Navigator Cardiac Imaging [facility] [phone] office [phone] cell  Patient to take 100mg  metoprolol tartrate two hours prior to his cardiac CT scan.  He is aware to arrive at 3:30 PM.

## 2024-02-10 ENCOUNTER — Ambulatory Visit (HOSPITAL_COMMUNITY)
Admission: RE | Admit: 2024-02-10 | Discharge: 2024-02-10 | Disposition: A | Discharge status: Home / Self Care | Payer: BC Managed Care – PPO | Source: Ambulatory Visit | Attending: Nurse Practitioner | Admitting: Nurse Practitioner

## 2024-02-10 DIAGNOSIS — I517 Cardiomegaly: Secondary | ICD-10-CM | POA: Diagnosis not present

## 2024-02-10 DIAGNOSIS — R079 Chest pain, unspecified: Secondary | ICD-10-CM

## 2024-02-10 MED ORDER — IOHEXOL 350 MG/ML SOLN
95.0000 mL | Freq: Once | INTRAVENOUS | Status: AC | PRN
  Administered 2024-02-10: 95 mL via INTRAVENOUS

## 2024-02-10 MED ORDER — NITROGLYCERIN 0.4 MG SL SUBL
SUBLINGUAL_TABLET | SUBLINGUAL | Status: AC
  Filled 2024-02-10: qty 2

## 2024-02-10 MED ORDER — NITROGLYCERIN 0.4 MG SL SUBL
0.8000 mg | SUBLINGUAL_TABLET | SUBLINGUAL | Status: DC | PRN
  Administered 2024-02-10: 0.8 mg via SUBLINGUAL

## 2024-02-11 DIAGNOSIS — Z7151 Drug abuse counseling and surveillance of drug abuser: Secondary | ICD-10-CM | POA: Diagnosis not present

## 2024-02-11 DIAGNOSIS — F1120 Opioid dependence, uncomplicated: Secondary | ICD-10-CM | POA: Diagnosis not present

## 2024-02-12 ENCOUNTER — Ambulatory Visit: Payer: BC Managed Care – PPO

## 2024-02-20 ENCOUNTER — Other Ambulatory Visit: Payer: Self-pay | Admitting: Nurse Practitioner

## 2024-02-20 MED ORDER — ASPIRIN 81 MG PO TBEC: 81.0000 mg | DELAYED_RELEASE_TABLET | Freq: Every day | ORAL | Status: AC

## 2024-02-23 ENCOUNTER — Ambulatory Visit: Payer: Self-pay | Admitting: *Deleted

## 2024-02-23 NOTE — Patient Instructions (Signed)
 Visit Information  Thank you for taking time to visit with me today. Please don't hesitate to contact me if I can be of assistance to you.   Following are the goals we discussed today:   Goals Addressed               This Visit's Progress     COMPLETED: Receive Counseling & Supportive Services to Reduce & Manage Symptoms of Anxiety & Stress. (pt-stated)   On track     Care Coordination Interventions:  Interventions Today    Flowsheet Row Most Recent Value  Chronic Disease   Chronic disease during today's visit Other, Chronic Obstructive Pulmonary Disease (COPD)  [Anxiety, Depression, Opiate Addiction, Panic Attacks, Stress, 2 Recent Emergency Department Visits for Chest Pain, Elevated Fasting Blood Sugar, & Elevated A1C.]  General Interventions   General Interventions Discussed/Reviewed General Interventions Discussed, Labs, Vaccines, Annual Foot Exam, Health Screening, Sick Day Rules, Lipid Profile, General Interventions Reviewed, Annual Eye Exam, Durable Medical Equipment (DME), [facility], Doctor Visits, Communication with  [Encouraged Routine Engagement with Care Team Members & Providers.]  Labs Hgb A1c every 3 months, Kidney Function, Hgb A1c annually  [Encouraged Routine Lab Work.]  Vaccines COVID-19, Flu, Pneumonia, RSV, Shingles, Tetanus/Pertussis/Diphtheria  [Encouraged Routine Vaccinations.]  Doctor Visits Discussed/Reviewed Doctor Visits Discussed, Specialist, Doctor Visits Reviewed, Annual Wellness Visits, PCP  [Encouraged Routine Engagement with Care Team Members & Providers.]  Health Screening Bone Density, Colonoscopy, Prostate  [Encouraged Routine Health Screenings.]  Durable Medical Equipment (DME) Glucomoter, Other  [Scales, Prescription Eyeglasses, Blood Pressure Cuff.]  PCP/Specialist Visits Compliance with follow-up visit  [Encouraged Routine Engagement with Care Team Members & Providers.]  Communication with PCP/Specialists, RN, Pharmacists, Social Work   [employer] Routine Engagement with Care Team Members & Providers.]  Exercise Interventions   Exercise Discussed/Reviewed Exercise Discussed, Assistive device use and maintanence, Exercise Reviewed, Physical Activity, Weight Managment  [Encouraged Daily Exercise Regimen, as Tolerated.]  Physical Activity Discussed/Reviewed Physical Activity Discussed, Home Exercise Program (HEP), PREP, Gym, Types of exercise, Physical Activity Reviewed  [Encouraged Increased Level of Activity & Exercise, Inside & Outside the Home.]  Weight Management Weight loss  [Encouraged Healthy Weight Loss Program.]  Education Interventions   Education Provided Provided Therapist, sports, Provided Web-based Education, Provided Education  [employer] Material & Encouraged Independent Review.]  Provided Engineer, petroleum On Nutrition, Mental Health/Coping with Illness, When to see the doctor, Sick Day Rules, [employer], Development worker, community, Medication, Blood Sugar Monitoring, Exercise, Applications, Foot Care, Eye Care, [employer] Reviewed Educational Material to [employer] & Entertain Questions.]  Labs Reviewed Hgb A1c  [Encouraged Routine Lab Checks & Logging Results.]  Mental Health Interventions   Mental Health Discussed/Reviewed Mental Health Discussed, Anxiety, Depression, Grief and Loss, Mental Health Reviewed, Substance Abuse, Coping Strategies, Other, Crisis, Suicide  [Assessed Mental Health & Cognitive Status.]  Nutrition Interventions   Nutrition Discussed/Reviewed Nutrition Discussed, Adding fruits and vegetables, Increasing proteins, Decreasing sugar intake, Portion sizes, Decreasing salt, Carbohydrate meal planning, Nutrition Reviewed, Fluid intake, Decreasing fats  [Encouraged Heart-Healthy, Diabetic-Friendly, Low Sodium, Reduced Fat Diet.]  Pharmacy Interventions   Pharmacy Dicussed/Reviewed Pharmacy Topics Discussed, Medication Adherence, Pharmacy Topics Reviewed, Medications and their  functions, Affording Medications  [Confirmed Compliance with Prescription Medications.]  Medication Adherence --  [Confirmed Ability to [employer] Prescription Medications.]  Safety Interventions   Safety Discussed/Reviewed Safety Discussed, Safety Reviewed  [Encouraged Routine Use of Assistive Devices & Durable Medical Equipment.]  Advanced Directive Interventions   Advanced Directives Discussed/Reviewed Advanced Directives Discussed,  Advanced Directives Reviewed  [Encouraged Initiation of Advanced Directives (Living Will & Healthcare Power of Corporate treasurer), Offering to [employer], Assist with Completion, Make Copies & Scan into Electronic Medical Record in Epic.]      Active Listening & Reflection Utilized.  Verbalization of Feelings Encouraged.  Emotional Support Provided. Acceptance & Commitment Therapy Indicated. Cognitive Behavioral Therapy Initiated. Client-Centered Therapy Implemented. Encouraged Engagement with Danford Bad, Licensed Clinical Social Worker with [facility], [facility] [phone]), if You Have Questions, Need Assistance, Additional Social Work Needs Are Identified, or If You Change Your Mind About Wanting to Receive Social Work Services.      Please call the care guide team at [phone] if you need to cancel or reschedule your appointment.   If you are experiencing a Mental Health or Behavioral Health Crisis or need someone to talk to, please call the Suicide and Crisis Lifeline: 988 call the [place] National Suicide Prevention Lifeline: 402-519-7665 or TTY: 954-207-9510 TTY (551)140-8219) to talk to a trained counselor call 1-800-273-TALK (toll free, 24 hour hotline) go to [facility] [address], [place] [id number]) call the [facility] Crisis Line: (831)575-0893 call 911  Patient verbalizes understanding of instructions and care plan provided today and agrees to  view in MyChart. Active MyChart status and patient understanding of how to access instructions and care plan via MyChart confirmed with patient.     No further follow up required.  Danford Bad, BSW, MSW, LCSW [facility], [facility] Clinical Social Worker II Direct Dial: [phone]  Fax: [phone] Website: [email or web address]

## 2024-02-23 NOTE — Patient Outreach (Signed)
 Care Coordination   Follow Up Visit Note   02/23/2024  Name: John J Swaziland Jr. MRN: [id number] DOB: 25-Jul-1976  John J Swaziland Jr. is a 48 y.o. year old male who sees Fanta, Wayland Salinas, MD for primary care. I spoke with Earnie J Swaziland Jr. by phone today.  What matters to the patients health and wellness today?  Receive Counseling & Supportive Services to Reduce & Manage Symptoms of Anxiety & Stress.    Goals Addressed               This Visit's Progress     COMPLETED: Receive Counseling & Supportive Services to Reduce & Manage Symptoms of Anxiety & Stress. (pt-stated)   On track     Care Coordination Interventions:  Interventions Today    Flowsheet Row Most Recent Value  Chronic Disease   Chronic disease during today's visit Other, Chronic Obstructive Pulmonary Disease (COPD)  [Anxiety, Depression, Opiate Addiction, Panic Attacks, Stress, 2 Recent Emergency Department Visits for Chest Pain, Elevated Fasting Blood Sugar, & Elevated A1C.]  General Interventions   General Interventions Discussed/Reviewed General Interventions Discussed, Labs, Vaccines, Annual Foot Exam, Health Screening, Sick Day Rules, Lipid Profile, General Interventions Reviewed, Annual Eye Exam, Durable Medical Equipment (DME), [facility], Doctor Visits, Communication with  [Encouraged Routine Engagement with Care Team Members & Providers.]  Labs Hgb A1c every 3 months, Kidney Function, Hgb A1c annually  [Encouraged Routine Lab Work.]  Vaccines COVID-19, Flu, Pneumonia, RSV, Shingles, Tetanus/Pertussis/Diphtheria  [Encouraged Routine Vaccinations.]  Doctor Visits Discussed/Reviewed Doctor Visits Discussed, Specialist, Doctor Visits Reviewed, Annual Wellness Visits, PCP  [Encouraged Routine Engagement with Care Team Members & Providers.]  Health Screening Bone Density, Colonoscopy, Prostate  [Encouraged Routine Health Screenings.]  Durable Medical Equipment (DME) Glucomoter, Other  [Scales,  Prescription Eyeglasses, Blood Pressure Cuff.]  PCP/Specialist Visits Compliance with follow-up visit  [Encouraged Routine Engagement with Care Team Members & Providers.]  Communication with PCP/Specialists, RN, Pharmacists, Social Work  [employer] Routine Engagement with Care Team Members & Providers.]  Exercise Interventions   Exercise Discussed/Reviewed Exercise Discussed, Assistive device use and maintanence, Exercise Reviewed, Physical Activity, Weight Managment  [Encouraged Daily Exercise Regimen, as Tolerated.]  Physical Activity Discussed/Reviewed Physical Activity Discussed, Home Exercise Program (HEP), PREP, Gym, Types of exercise, Physical Activity Reviewed  [Encouraged Increased Level of Activity & Exercise, Inside & Outside the Home.]  Weight Management Weight loss  [Encouraged Healthy Weight Loss Program.]  Education Interventions   Education Provided Provided Therapist, sports, Provided Web-based Education, Provided Education  [employer] Material & Encouraged Independent Review.]  Provided Engineer, petroleum On Nutrition, Mental Health/Coping with Illness, When to see the doctor, Sick Day Rules, [employer], Development worker, community, Medication, Blood Sugar Monitoring, Exercise, Applications, Foot Care, Eye Care, [employer] Reviewed Educational Material to [employer] & Entertain Questions.]  Labs Reviewed Hgb A1c  [Encouraged Routine Lab Checks & Logging Results.]  Mental Health Interventions   Mental Health Discussed/Reviewed Mental Health Discussed, Anxiety, Depression, Grief and Loss, Mental Health Reviewed, Substance Abuse, Coping Strategies, Other, Crisis, Suicide  [Assessed Mental Health & Cognitive Status.]  Nutrition Interventions   Nutrition Discussed/Reviewed Nutrition Discussed, Adding fruits and vegetables, Increasing proteins, Decreasing sugar intake, Portion sizes, Decreasing salt, Carbohydrate meal planning, Nutrition Reviewed, Fluid intake,  Decreasing fats  [Encouraged Heart-Healthy, Diabetic-Friendly, Low Sodium, Reduced Fat Diet.]  Pharmacy Interventions   Pharmacy Dicussed/Reviewed Pharmacy Topics Discussed, Medication Adherence, Pharmacy Topics Reviewed, Medications and their functions, Affording Medications  [Confirmed Compliance with Prescription Medications.]  Medication Adherence --  [  Confirmed Ability to Afford Prescription Medications.]  Safety Interventions   Safety Discussed/Reviewed Safety Discussed, Safety Reviewed  [Encouraged Routine Use of Assistive Devices & Durable Medical Equipment.]  Advanced Directive Interventions   Advanced Directives Discussed/Reviewed Advanced Directives Discussed, Advanced Directives Reviewed  [Encouraged Initiation of Advanced Directives (Living Will & Healthcare Power of Corporate treasurer), Offering to [employer], Assist with Completion, Make Copies & Scan into Electronic Medical Record in Epic.]      Active Listening & Reflection Utilized.  Verbalization of Feelings Encouraged.  Emotional Support Provided. Acceptance & Commitment Therapy Indicated. Cognitive Behavioral Therapy Initiated. Client-Centered Therapy Implemented. Encouraged Engagement with Danford Bad, Licensed Clinical Social Worker with [facility], [facility] [phone]), if You Have Questions, Need Assistance, Additional Social Work Needs Are Identified, or If You Change Your Mind About Wanting to Receive Social Work Services.      SDOH assessments and interventions completed:  Yes.  Care Coordination Interventions:  Yes, provided.   Follow up plan: No further intervention required.   Encounter Outcome:  Patient Visit Completed.    Danford Bad, BSW, MSW, LCSW [facility], [facility] Clinical Social Worker II Direct Dial: [phone]  Fax: [phone] Website: [email or web address]

## 2024-02-24 ENCOUNTER — Encounter: Payer: Self-pay | Admitting: *Deleted

## 2024-02-24 DIAGNOSIS — Z7151 Drug abuse counseling and surveillance of drug abuser: Secondary | ICD-10-CM | POA: Diagnosis not present

## 2024-02-24 DIAGNOSIS — F1120 Opioid dependence, uncomplicated: Secondary | ICD-10-CM | POA: Diagnosis not present

## 2024-02-24 NOTE — Patient Outreach (Signed)
 Erroneous Encounter

## 2024-03-02 ENCOUNTER — Other Ambulatory Visit: Payer: BC Managed Care – PPO

## 2024-03-02 DIAGNOSIS — Z7151 Drug abuse counseling and surveillance of drug abuser: Secondary | ICD-10-CM | POA: Diagnosis not present

## 2024-03-02 DIAGNOSIS — F1120 Opioid dependence, uncomplicated: Secondary | ICD-10-CM | POA: Diagnosis not present

## 2024-03-03 ENCOUNTER — Ambulatory Visit: Attending: Nurse Practitioner

## 2024-03-03 DIAGNOSIS — I350 Nonrheumatic aortic (valve) stenosis: Secondary | ICD-10-CM | POA: Diagnosis not present

## 2024-03-03 DIAGNOSIS — R079 Chest pain, unspecified: Secondary | ICD-10-CM | POA: Diagnosis not present

## 2024-03-03 LAB — ECHOCARDIOGRAM COMPLETE
AR max vel: 2.04 cm2
AV Area VTI: 2.02 cm2
AV Area mean vel: 2.01 cm2
AV Mean grad: 10 mmHg
AV Peak grad: 21.7 mmHg
Ao pk vel: 2.33 m/s
Area-P 1/2: 2.42 cm2
Calc EF: 64.9 %
MV VTI: 1.89 cm2
S' Lateral: 2.8 cm
Single Plane A2C EF: 59 %
Single Plane A4C EF: 69.6 %

## 2024-03-03 MED ORDER — PERFLUTREN LIPID MICROSPHERE
1.0000 mL | INTRAVENOUS | Status: AC | PRN
  Administered 2024-03-03: 2 mL via INTRAVENOUS

## 2024-03-08 ENCOUNTER — Ambulatory Visit: Payer: BC Managed Care – PPO | Admitting: Nurse Practitioner

## 2024-03-10 DIAGNOSIS — F1120 Opioid dependence, uncomplicated: Secondary | ICD-10-CM | POA: Diagnosis not present

## 2024-03-10 DIAGNOSIS — Z7151 Drug abuse counseling and surveillance of drug abuser: Secondary | ICD-10-CM | POA: Diagnosis not present

## 2024-03-15 DIAGNOSIS — F1120 Opioid dependence, uncomplicated: Secondary | ICD-10-CM | POA: Diagnosis not present

## 2024-03-16 ENCOUNTER — Ambulatory Visit: Payer: Self-pay

## 2024-03-16 DIAGNOSIS — I10 Essential (primary) hypertension: Secondary | ICD-10-CM | POA: Diagnosis not present

## 2024-03-16 DIAGNOSIS — F1120 Opioid dependence, uncomplicated: Secondary | ICD-10-CM | POA: Diagnosis not present

## 2024-03-16 DIAGNOSIS — Z7151 Drug abuse counseling and surveillance of drug abuser: Secondary | ICD-10-CM | POA: Diagnosis not present

## 2024-03-16 DIAGNOSIS — J410 Simple chronic bronchitis: Secondary | ICD-10-CM | POA: Diagnosis not present

## 2024-03-22 DIAGNOSIS — F1120 Opioid dependence, uncomplicated: Secondary | ICD-10-CM | POA: Diagnosis not present

## 2024-03-22 DIAGNOSIS — Z7151 Drug abuse counseling and surveillance of drug abuser: Secondary | ICD-10-CM | POA: Diagnosis not present

## 2024-03-23 DIAGNOSIS — F1120 Opioid dependence, uncomplicated: Secondary | ICD-10-CM | POA: Diagnosis not present

## 2024-03-23 DIAGNOSIS — Z7151 Drug abuse counseling and surveillance of drug abuser: Secondary | ICD-10-CM | POA: Diagnosis not present

## 2024-03-30 DIAGNOSIS — Z7151 Drug abuse counseling and surveillance of drug abuser: Secondary | ICD-10-CM | POA: Diagnosis not present

## 2024-03-30 DIAGNOSIS — F1120 Opioid dependence, uncomplicated: Secondary | ICD-10-CM | POA: Diagnosis not present

## 2024-04-07 DIAGNOSIS — F1120 Opioid dependence, uncomplicated: Secondary | ICD-10-CM | POA: Diagnosis not present

## 2024-04-07 DIAGNOSIS — Z7151 Drug abuse counseling and surveillance of drug abuser: Secondary | ICD-10-CM | POA: Diagnosis not present

## 2024-04-13 DIAGNOSIS — F1120 Opioid dependence, uncomplicated: Secondary | ICD-10-CM | POA: Diagnosis not present

## 2024-04-13 DIAGNOSIS — Z7151 Drug abuse counseling and surveillance of drug abuser: Secondary | ICD-10-CM | POA: Diagnosis not present

## 2024-04-20 DIAGNOSIS — Z7151 Drug abuse counseling and surveillance of drug abuser: Secondary | ICD-10-CM | POA: Diagnosis not present

## 2024-04-24 DIAGNOSIS — J441 Chronic obstructive pulmonary disease with (acute) exacerbation: Secondary | ICD-10-CM | POA: Diagnosis not present

## 2024-04-24 DIAGNOSIS — R030 Elevated blood-pressure reading, without diagnosis of hypertension: Secondary | ICD-10-CM | POA: Diagnosis not present

## 2024-04-24 DIAGNOSIS — Z6835 Body mass index (BMI) 35.0-35.9, adult: Secondary | ICD-10-CM | POA: Diagnosis not present

## 2024-04-26 ENCOUNTER — Ambulatory Visit: Attending: Nurse Practitioner | Admitting: Nurse Practitioner

## 2024-04-26 ENCOUNTER — Encounter: Payer: Self-pay | Admitting: Nurse Practitioner

## 2024-04-27 DIAGNOSIS — Z7151 Drug abuse counseling and surveillance of drug abuser: Secondary | ICD-10-CM | POA: Diagnosis not present

## 2024-04-27 DIAGNOSIS — F1120 Opioid dependence, uncomplicated: Secondary | ICD-10-CM | POA: Diagnosis not present

## 2024-05-02 ENCOUNTER — Other Ambulatory Visit: Payer: Self-pay

## 2024-05-02 ENCOUNTER — Emergency Department (HOSPITAL_COMMUNITY)
Admission: EM | Admit: 2024-05-02 | Discharge: 2024-05-03 | Disposition: A | Discharge status: Home / Self Care | Attending: Emergency Medicine | Admitting: Emergency Medicine

## 2024-05-02 DIAGNOSIS — E1165 Type 2 diabetes mellitus with hyperglycemia: Secondary | ICD-10-CM | POA: Diagnosis not present

## 2024-05-02 DIAGNOSIS — R739 Hyperglycemia, unspecified: Secondary | ICD-10-CM | POA: Insufficient documentation

## 2024-05-02 DIAGNOSIS — R0789 Other chest pain: Secondary | ICD-10-CM | POA: Diagnosis not present

## 2024-05-02 DIAGNOSIS — E669 Obesity, unspecified: Secondary | ICD-10-CM | POA: Diagnosis not present

## 2024-05-02 DIAGNOSIS — R718 Other abnormality of red blood cells: Secondary | ICD-10-CM | POA: Diagnosis not present

## 2024-05-02 DIAGNOSIS — Z6834 Body mass index (BMI) 34.0-34.9, adult: Secondary | ICD-10-CM | POA: Diagnosis not present

## 2024-05-02 DIAGNOSIS — Z7982 Long term (current) use of aspirin: Secondary | ICD-10-CM | POA: Insufficient documentation

## 2024-05-02 DIAGNOSIS — I10 Essential (primary) hypertension: Secondary | ICD-10-CM | POA: Diagnosis not present

## 2024-05-02 DIAGNOSIS — R202 Paresthesia of skin: Secondary | ICD-10-CM | POA: Diagnosis not present

## 2024-05-02 DIAGNOSIS — R079 Chest pain, unspecified: Secondary | ICD-10-CM | POA: Diagnosis not present

## 2024-05-02 DIAGNOSIS — R030 Elevated blood-pressure reading, without diagnosis of hypertension: Secondary | ICD-10-CM | POA: Diagnosis not present

## 2024-05-02 DIAGNOSIS — J209 Acute bronchitis, unspecified: Secondary | ICD-10-CM | POA: Diagnosis not present

## 2024-05-02 NOTE — ED Triage Notes (Signed)
 Pt bib [facility] from home, pt states he woke up to just the restroom and felt burning like sensation that started in his abdominal that radiated to his chest and shoulders. Pain has now resolved but pt states "he just feels off"

## 2024-05-03 ENCOUNTER — Telehealth: Payer: Self-pay | Admitting: Urology

## 2024-05-03 ENCOUNTER — Emergency Department (HOSPITAL_COMMUNITY)

## 2024-05-03 LAB — COMPREHENSIVE METABOLIC PANEL WITH GFR
ALT: 50 U/L — ABNORMAL HIGH (ref 0–44)
AST: 24 U/L (ref 15–41)
Albumin: 4.2 g/dL (ref 3.5–5.0)
Alkaline Phosphatase: 77 U/L (ref 38–126)
Anion gap: 8 (ref 5–15)
BUN: 15 mg/dL (ref 6–20)
CO2: 22 mmol/L (ref 22–32)
Calcium: 9.2 mg/dL (ref 8.9–10.3)
Chloride: 100 mmol/L (ref 98–111)
Creatinine, Ser: 0.84 mg/dL (ref 0.61–1.24)
GFR, Estimated: >60 mL/min (ref >60)
Glucose, Bld: 247 mg/dL — ABNORMAL HIGH (ref 70–99)
Potassium: 4.1 mmol/L (ref 3.5–5.1)
Sodium: 130 mmol/L — ABNORMAL LOW (ref 135–145)
Total Bilirubin: 0.6 mg/dL (ref 0.0–1.2)
Total Protein: 7.7 g/dL (ref 6.5–8.1)

## 2024-05-03 LAB — CBC WITH DIFFERENTIAL/PLATELET
Abs Immature Granulocytes: 0.1 10*3/uL — ABNORMAL HIGH (ref 0.00–0.07)
Basophils Absolute: 0 10*3/uL (ref 0.0–0.1)
Basophils Relative: 0 %
Eosinophils Absolute: 0 10*3/uL (ref 0.0–0.5)
Eosinophils Relative: 0 %
HCT: 53.2 % — ABNORMAL HIGH (ref 39.0–52.0)
Hemoglobin: 18.4 g/dL — ABNORMAL HIGH (ref 13.0–17.0)
Immature Granulocytes: 1 %
Lymphocytes Relative: 6 %
Lymphs Abs: 0.9 10*3/uL (ref 0.7–4.0)
MCH: 30.5 pg (ref 26.0–34.0)
MCHC: 34.6 g/dL (ref 30.0–36.0)
MCV: 88.2 fL (ref 80.0–100.0)
Monocytes Absolute: 0.3 10*3/uL (ref 0.1–1.0)
Monocytes Relative: 2 %
Neutro Abs: 13.4 10*3/uL — ABNORMAL HIGH (ref 1.7–7.7)
Neutrophils Relative %: 91 %
Platelets: 242 10*3/uL (ref 150–400)
RBC: 6.03 MIL/uL — ABNORMAL HIGH (ref 4.22–5.81)
RDW: 12.7 % (ref 11.5–15.5)
WBC: 14.7 10*3/uL — ABNORMAL HIGH (ref 4.0–10.5)
nRBC: 0 % (ref 0.0–0.2)

## 2024-05-03 LAB — LIPASE, BLOOD: Lipase: 26 U/L (ref 11–51)

## 2024-05-03 LAB — TROPONIN I (HIGH SENSITIVITY): Troponin I (High Sensitivity): 13 ng/L (ref <18)

## 2024-05-03 NOTE — Telephone Encounter (Signed)
 Had blood work at ER and wants someone to look at results and advise. I told him it looks like his PCP would go over them and he said Dahlstedt always checks them here because he is on testosterone . He wants a nurse to call him

## 2024-05-04 DIAGNOSIS — Z7151 Drug abuse counseling and surveillance of drug abuser: Secondary | ICD-10-CM | POA: Diagnosis not present

## 2024-05-04 DIAGNOSIS — F1120 Opioid dependence, uncomplicated: Secondary | ICD-10-CM | POA: Diagnosis not present

## 2024-05-04 NOTE — ED Provider Notes (Signed)
 [place] EMERGENCY DEPARTMENT AT [facility] Provider Note   CSN: [id number] Arrival date & time: 05/02/24  2341     History  Chief Complaint  Patient presents with   Chest Pain    John Clas Swaziland Jr. is a 48 y.o. male.  48 yo M here with a 'funny feeling' in his epigastric area that radiates to the back and up his spine his neck. Started tonight. Mostly resolved at this point. No dyspnea. No productive cough. No fever. No trauma. No leg edema. No actual pain. Tried antacids.   Chest Pain      Home Medications Prior to Admission medications   Medication Sig Start Date End Date Taking? Authorizing Provider  albuterol  (VENTOLIN  HFA) 108 (90 Base) MCG/ACT inhaler Inhale 2 puffs into the lungs 4 (four) times daily as needed. 02/10/22   [provider]  aspirin  EC 81 MG tablet Take 1 tablet (81 mg total) by mouth daily. Swallow whole. 02/20/24   Lasalle Pointer, NP  Buprenorphine  HCl-Naloxone  HCl 12-3 MG FILM Place 1 strip under the tongue 2 (two) times daily. 11/15/21   [provider]  ibuprofen  (ADVIL ) 200 MG tablet Take 800 mg by mouth 2 (two) times daily as needed. Taken once or twice a day for pain    [provider]  ipratropium-albuterol  (DUONEB) 0.5-2.5 (3) MG/3ML SOLN Take 3 mLs by nebulization every 6 (six) hours as needed (shortness of breath).    [provider]  metoprolol  tartrate (LOPRESSOR ) 100 MG tablet Take (1) 100 Mg tablet 2 hours before procedure 01/26/24   Lasalle Pointer, NP  NEEDLE, DISP, 18 G 18G X 1-1/2" MISC Use to draw up testosterone  for injection. 08/15/23   Trent Frizzle, MD  nitroGLYCERIN  (NITROSTAT ) 0.4 MG SL tablet Place 1 tablet (0.4 mg total) under the tongue every 5 (five) minutes as needed for chest pain. 01/26/24 04/25/24  Lasalle Pointer, NP  SYRINGE-NEEDLE, DISP, 3 ML (SAFETY SYRINGE/NEEDLE) 22G X 1-1/2" 3 ML MISC Use to inject testosterone . 08/15/23   Trent Frizzle, MD  testosterone  cypionate  (DEPOTESTOSTERONE CYPIONATE) 200 MG/ML injection Inject 0.5 mLs (100 mg total) into the muscle every 7 (seven) days. 01/12/24   Trent Frizzle, MD      Allergies    No known allergies and Other    Review of Systems   Review of Systems  Cardiovascular:  Positive for chest pain.    Physical Exam Updated Vital Signs BP (!) 163/80   Pulse 75   Temp 98.4 F (36.9 C)   Resp 15   SpO2 93%  Physical Exam Vitals and nursing note reviewed.  Constitutional:      Appearance: He is well-developed.  HENT:     Head: Normocephalic and atraumatic.  Cardiovascular:     Rate and Rhythm: Normal rate.     Comments: Symmetric and normal radial/DP pulses bilaterally No edema Pulmonary:     Effort: Pulmonary effort is normal. No respiratory distress.  Abdominal:     General: There is no distension.  Musculoskeletal:        General: Normal range of motion.     Cervical back: Normal range of motion.     Right lower leg: No edema.     Left lower leg: No edema.  Neurological:     Mental Status: He is alert.     ED Results / Procedures / Treatments   Labs (all labs ordered are listed, but only abnormal results are displayed) Labs Reviewed  CBC  WITH DIFFERENTIAL/PLATELET - Abnormal; Notable for the following components:      Result Value   WBC 14.7 (*)    RBC 6.03 (*)    Hemoglobin 18.4 (*)    HCT 53.2 (*)    Neutro Abs 13.4 (*)    Abs Immature Granulocytes 0.10 (*)    All other components within normal limits  COMPREHENSIVE METABOLIC PANEL WITH GFR - Abnormal; Notable for the following components:   Sodium 130 (*)    Glucose, Bld 247 (*)    ALT 50 (*)    All other components within normal limits  LIPASE, BLOOD  TROPONIN I (HIGH SENSITIVITY)    EKG EKG Interpretation Date/Time:  Sunday May 02 2024 23:52:20 EDT Ventricular Rate:  86 PR Interval:  159 QRS Duration:  93 QT Interval:  343 QTC Calculation: 411 R Axis:   29  Text Interpretation: Sinus rhythm Confirmed by  Eve Hinders [phone]) on 05/03/2024 12:20:55 AM  Radiology DG Chest 2 View Result Date: 05/03/2024 CLINICAL DATA:  Chest pain EXAM: CHEST - 2 VIEW COMPARISON:  01/06/2024 FINDINGS: The heart size and mediastinal contours are within normal limits. Both lungs are clear. The visualized skeletal structures are unremarkable. IMPRESSION: No active cardiopulmonary disease. Electronically Signed   By: Janeece Mechanic M.D.   On: 05/03/2024 01:47    Procedures Procedures    Medications Ordered in ED Medications - No data to display  ED Course/ Medical Decision Making/ A&P                                 Medical Decision Making Amount and/or Complexity of Data Reviewed Labs: ordered. Radiology: ordered.   Unclear etiology of ysmptoms. ACS workup reassuring. Recommended ct scan to rule out GI or dissection however patient stated he felt better and declined. Will fu w/ pcp for furhter management if symptoms return, here if worsening or new associated symptoms.    Final Clinical Impression(s) / ED Diagnoses Final diagnoses:  Elevated hematocrit  Hyperglycemia    Rx / DC Orders ED Discharge Orders     None         Zanovia Rotz, Reymundo Caulk, MD 05/04/24 [id number]

## 2024-05-11 DIAGNOSIS — Z0001 Encounter for general adult medical examination with abnormal findings: Secondary | ICD-10-CM | POA: Diagnosis not present

## 2024-05-11 DIAGNOSIS — I10 Essential (primary) hypertension: Secondary | ICD-10-CM | POA: Diagnosis not present

## 2024-05-11 DIAGNOSIS — J449 Chronic obstructive pulmonary disease, unspecified: Secondary | ICD-10-CM | POA: Diagnosis not present

## 2024-05-11 DIAGNOSIS — F17210 Nicotine dependence, cigarettes, uncomplicated: Secondary | ICD-10-CM | POA: Diagnosis not present

## 2024-05-11 DIAGNOSIS — R7303 Prediabetes: Secondary | ICD-10-CM | POA: Diagnosis not present

## 2024-05-17 DIAGNOSIS — Z7151 Drug abuse counseling and surveillance of drug abuser: Secondary | ICD-10-CM | POA: Diagnosis not present

## 2024-05-17 DIAGNOSIS — F1120 Opioid dependence, uncomplicated: Secondary | ICD-10-CM | POA: Diagnosis not present

## 2024-05-18 ENCOUNTER — Ambulatory Visit: Payer: BC Managed Care – PPO | Admitting: "Endocrinology

## 2024-05-25 DIAGNOSIS — F1120 Opioid dependence, uncomplicated: Secondary | ICD-10-CM | POA: Diagnosis not present

## 2024-05-25 DIAGNOSIS — Z7151 Drug abuse counseling and surveillance of drug abuser: Secondary | ICD-10-CM | POA: Diagnosis not present

## 2024-06-02 DIAGNOSIS — F1120 Opioid dependence, uncomplicated: Secondary | ICD-10-CM | POA: Diagnosis not present

## 2024-06-02 DIAGNOSIS — Z7151 Drug abuse counseling and surveillance of drug abuser: Secondary | ICD-10-CM | POA: Diagnosis not present

## 2024-06-08 DIAGNOSIS — Z7151 Drug abuse counseling and surveillance of drug abuser: Secondary | ICD-10-CM | POA: Diagnosis not present

## 2024-06-08 DIAGNOSIS — F1120 Opioid dependence, uncomplicated: Secondary | ICD-10-CM | POA: Diagnosis not present

## 2024-06-10 ENCOUNTER — Telehealth: Payer: Self-pay | Admitting: Urology

## 2024-06-10 ENCOUNTER — Other Ambulatory Visit: Payer: Self-pay | Admitting: Urology

## 2024-06-10 DIAGNOSIS — R7989 Other specified abnormal findings of blood chemistry: Secondary | ICD-10-CM

## 2024-06-10 NOTE — Telephone Encounter (Signed)
 Needs Testosterone  sent to [facility] [address]

## 2024-06-12 ENCOUNTER — Other Ambulatory Visit: Payer: Self-pay | Admitting: Urology

## 2024-06-12 DIAGNOSIS — R7989 Other specified abnormal findings of blood chemistry: Secondary | ICD-10-CM

## 2024-06-14 ENCOUNTER — Other Ambulatory Visit: Payer: Self-pay

## 2024-06-14 DIAGNOSIS — R7989 Other specified abnormal findings of blood chemistry: Secondary | ICD-10-CM

## 2024-06-14 NOTE — Telephone Encounter (Signed)
 Pt called to get his Rx of testosterone  refilled pt advised a refill request will be sent to provider

## 2024-06-15 ENCOUNTER — Other Ambulatory Visit: Payer: Self-pay | Admitting: Urology

## 2024-06-15 DIAGNOSIS — R7989 Other specified abnormal findings of blood chemistry: Secondary | ICD-10-CM

## 2024-06-15 MED ORDER — TESTOSTERONE CYPIONATE 200 MG/ML IM SOLN
100.0000 mg | INTRAMUSCULAR | 1 refills | Status: DC
Start: 2024-06-15 — End: 2024-11-02

## 2024-06-15 NOTE — Telephone Encounter (Signed)
 Called back and still hasn't received refill for testosterone 

## 2024-06-22 DIAGNOSIS — F1120 Opioid dependence, uncomplicated: Secondary | ICD-10-CM | POA: Diagnosis not present

## 2024-06-22 DIAGNOSIS — Z7151 Drug abuse counseling and surveillance of drug abuser: Secondary | ICD-10-CM | POA: Diagnosis not present

## 2024-06-26 ENCOUNTER — Ambulatory Visit: Payer: Self-pay | Admitting: Nurse Practitioner

## 2024-06-29 DIAGNOSIS — Z7151 Drug abuse counseling and surveillance of drug abuser: Secondary | ICD-10-CM | POA: Diagnosis not present

## 2024-06-29 DIAGNOSIS — F1120 Opioid dependence, uncomplicated: Secondary | ICD-10-CM | POA: Diagnosis not present

## 2024-06-30 DIAGNOSIS — Z7151 Drug abuse counseling and surveillance of drug abuser: Secondary | ICD-10-CM | POA: Diagnosis not present

## 2024-06-30 DIAGNOSIS — F1120 Opioid dependence, uncomplicated: Secondary | ICD-10-CM | POA: Diagnosis not present

## 2024-07-01 ENCOUNTER — Other Ambulatory Visit

## 2024-07-05 DIAGNOSIS — F1120 Opioid dependence, uncomplicated: Secondary | ICD-10-CM | POA: Diagnosis not present

## 2024-07-05 DIAGNOSIS — Z7151 Drug abuse counseling and surveillance of drug abuser: Secondary | ICD-10-CM | POA: Diagnosis not present

## 2024-07-06 ENCOUNTER — Other Ambulatory Visit: Payer: BC Managed Care – PPO

## 2024-07-13 ENCOUNTER — Ambulatory Visit: Payer: BC Managed Care – PPO | Admitting: Urology

## 2024-07-13 DIAGNOSIS — F1120 Opioid dependence, uncomplicated: Secondary | ICD-10-CM | POA: Diagnosis not present

## 2024-07-13 DIAGNOSIS — Z7151 Drug abuse counseling and surveillance of drug abuser: Secondary | ICD-10-CM | POA: Diagnosis not present

## 2024-07-14 ENCOUNTER — Emergency Department (HOSPITAL_COMMUNITY)

## 2024-07-14 ENCOUNTER — Emergency Department (HOSPITAL_COMMUNITY)
Admission: EM | Admit: 2024-07-14 | Discharge: 2024-07-14 | Discharge status: Against Medical Advice | Attending: Student | Admitting: Student

## 2024-07-14 ENCOUNTER — Other Ambulatory Visit: Payer: Self-pay

## 2024-07-14 DIAGNOSIS — R079 Chest pain, unspecified: Secondary | ICD-10-CM | POA: Insufficient documentation

## 2024-07-14 DIAGNOSIS — Z5321 Procedure and treatment not carried out due to patient leaving prior to being seen by health care provider: Secondary | ICD-10-CM | POA: Insufficient documentation

## 2024-07-14 DIAGNOSIS — R42 Dizziness and giddiness: Secondary | ICD-10-CM | POA: Diagnosis not present

## 2024-07-14 DIAGNOSIS — I10 Essential (primary) hypertension: Secondary | ICD-10-CM | POA: Diagnosis not present

## 2024-07-14 LAB — URINALYSIS, ROUTINE W REFLEX MICROSCOPIC
Bilirubin Urine: NEGATIVE
Glucose, UA: NEGATIVE mg/dL
Hgb urine dipstick: NEGATIVE
Ketones, ur: NEGATIVE mg/dL
Leukocytes,Ua: NEGATIVE
Nitrite: NEGATIVE
Protein, ur: NEGATIVE mg/dL
Specific Gravity, Urine: 1.014 (ref 1.005–1.030)
pH: 6 (ref 5.0–8.0)

## 2024-07-14 LAB — COMPREHENSIVE METABOLIC PANEL WITH GFR
ALT: 42 U/L (ref 0–44)
AST: 25 U/L (ref 15–41)
Albumin: 4.3 g/dL (ref 3.5–5.0)
Alkaline Phosphatase: 68 U/L (ref 38–126)
Anion gap: 10 (ref 5–15)
BUN: 15 mg/dL (ref 6–20)
CO2: 27 mmol/L (ref 22–32)
Calcium: 9.1 mg/dL (ref 8.9–10.3)
Chloride: 99 mmol/L (ref 98–111)
Creatinine, Ser: 0.93 mg/dL (ref 0.61–1.24)
GFR, Estimated: >60 mL/min (ref >60)
Glucose, Bld: 129 mg/dL — ABNORMAL HIGH (ref 70–99)
Potassium: 3.7 mmol/L (ref 3.5–5.1)
Sodium: 136 mmol/L (ref 135–145)
Total Bilirubin: 0.5 mg/dL (ref 0.0–1.2)
Total Protein: 7.5 g/dL (ref 6.5–8.1)

## 2024-07-14 LAB — CBC WITH DIFFERENTIAL/PLATELET
Abs Immature Granulocytes: 0.03 K/uL (ref 0.00–0.07)
Basophils Absolute: 0.1 K/uL (ref 0.0–0.1)
Basophils Relative: 1 %
Eosinophils Absolute: 0.3 K/uL (ref 0.0–0.5)
Eosinophils Relative: 4 %
HCT: 48 % (ref 39.0–52.0)
Hemoglobin: 16.8 g/dL (ref 13.0–17.0)
Immature Granulocytes: 0 %
Lymphocytes Relative: 17 %
Lymphs Abs: 1.6 K/uL (ref 0.7–4.0)
MCH: 31.1 pg (ref 26.0–34.0)
MCHC: 35 g/dL (ref 30.0–36.0)
MCV: 88.9 fL (ref 80.0–100.0)
Monocytes Absolute: 0.9 K/uL (ref 0.1–1.0)
Monocytes Relative: 9 %
Neutro Abs: 6.7 K/uL (ref 1.7–7.7)
Neutrophils Relative %: 69 %
Platelets: 234 K/uL (ref 150–400)
RBC: 5.4 MIL/uL (ref 4.22–5.81)
RDW: 13.1 % (ref 11.5–15.5)
WBC: 9.7 K/uL (ref 4.0–10.5)
nRBC: 0 % (ref 0.0–0.2)

## 2024-07-14 LAB — CK: Total CK: 370 U/L (ref 49–397)

## 2024-07-14 LAB — TROPONIN I (HIGH SENSITIVITY): Troponin I (High Sensitivity): 6 ng/L (ref <18)

## 2024-07-14 NOTE — ED Triage Notes (Signed)
 Pt arrived via [facility] from work after Pt started to develop sharp left-sided chest pain. While at work, Pt received 325 ASA. When [facility] arrived, they initiated IV Access and administered 1 SL NTG tablet. Pt reports his chest pain is not just a steady discomfort. 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

## 2024-07-27 DIAGNOSIS — F1120 Opioid dependence, uncomplicated: Secondary | ICD-10-CM | POA: Diagnosis not present

## 2024-07-27 DIAGNOSIS — Z7151 Drug abuse counseling and surveillance of drug abuser: Secondary | ICD-10-CM | POA: Diagnosis not present

## 2024-07-28 DIAGNOSIS — F1120 Opioid dependence, uncomplicated: Secondary | ICD-10-CM | POA: Diagnosis not present

## 2024-07-28 DIAGNOSIS — Z7151 Drug abuse counseling and surveillance of drug abuser: Secondary | ICD-10-CM | POA: Diagnosis not present

## 2024-08-03 DIAGNOSIS — F1120 Opioid dependence, uncomplicated: Secondary | ICD-10-CM | POA: Diagnosis not present

## 2024-08-03 DIAGNOSIS — Z7151 Drug abuse counseling and surveillance of drug abuser: Secondary | ICD-10-CM | POA: Diagnosis not present

## 2024-08-04 ENCOUNTER — Other Ambulatory Visit

## 2024-08-04 DIAGNOSIS — R7989 Other specified abnormal findings of blood chemistry: Secondary | ICD-10-CM

## 2024-08-05 ENCOUNTER — Other Ambulatory Visit

## 2024-08-05 LAB — HEMATOCRIT: Hematocrit: 50.8 % (ref 37.5–51.0)

## 2024-08-05 LAB — TESTOSTERONE: Testosterone: 1260 ng/dL — ABNORMAL HIGH (ref 264–916)

## 2024-08-05 LAB — HEMOGLOBIN: Hemoglobin: 16.8 g/dL (ref 13.0–17.7)

## 2024-08-10 ENCOUNTER — Ambulatory Visit: Payer: Self-pay | Admitting: Urology

## 2024-08-10 DIAGNOSIS — F1120 Opioid dependence, uncomplicated: Secondary | ICD-10-CM | POA: Diagnosis not present

## 2024-08-10 DIAGNOSIS — Z7151 Drug abuse counseling and surveillance of drug abuser: Secondary | ICD-10-CM | POA: Diagnosis not present

## 2024-08-20 ENCOUNTER — Encounter: Payer: Self-pay | Admitting: Radiology

## 2024-08-23 NOTE — Progress Notes (Signed)
 Impression/Assessment:  Low testosterone , on repletion with excellent symptomatic response  High prolactin level--he has an appointment to see an endocrinologist within the next month  Plan:     History of Present Illness: Here for f/u of low testosterone . He started on repletion (100 mg terstosterone cypionate IM q week) in mid August.  Recent labs--  Testosterone  level 642 (drawn on a Wednesday) PSA 0.4 Hemoglobin/hematocrit-16.7/49.7  He has missed some recent injections but overall tries to stay on schedule.  He is now injecting on Mondays.  Past Medical History:  Diagnosis Date   Anxiety    Bronchitis    COPD (chronic obstructive pulmonary disease) (HCC)    Depression    Opiate addiction (HCC)    Panic attacks    Pneumonia    Testicular torsion     Past Surgical History:  Procedure Laterality Date   APPENDECTOMY     EYE SURGERY     NASAL SINUS SURGERY     VASECTOMY      Home Medications:  Allergies as of 08/24/2024       Reactions   No Known Allergies    Other    No narcotics please        Medication List        Accurate as of August 23, 2024  9:11 AM. If you have any questions, ask your nurse or doctor.          albuterol  108 (90 Base) MCG/ACT inhaler Commonly known as: VENTOLIN  HFA Inhale 2 puffs into the lungs 4 (four) times daily as needed.   aspirin  EC 81 MG tablet Take 1 tablet (81 mg total) by mouth daily. Swallow whole.   Buprenorphine  HCl-Naloxone  HCl 12-3 MG Film Place 1 strip under the tongue 2 (two) times daily.   ibuprofen  200 MG tablet Commonly known as: ADVIL  Take 800 mg by mouth 2 (two) times daily as needed. Taken once or twice a day for pain   ipratropium-albuterol  0.5-2.5 (3) MG/3ML Soln Commonly known as: DUONEB Take 3 mLs by nebulization every 6 (six) hours as needed (shortness of breath).   metoprolol  tartrate 100 MG tablet Commonly known as: LOPRESSOR  Take (1) 100 Mg tablet 2 hours before procedure   NEEDLE  (DISP) 18 G 18G X 1-1/2 Misc Use to draw up testosterone  for injection.   nitroGLYCERIN  0.4 MG SL tablet Commonly known as: NITROSTAT  Place 1 tablet (0.4 mg total) under the tongue every 5 (five) minutes as needed for chest pain.   Safety Syringe/Needle 22G X 1-1/2 3 ML Misc Generic drug: SYRINGE-NEEDLE (DISP) 3 ML Use to inject testosterone .   testosterone  cypionate 200 MG/ML injection Commonly known as: DEPOTESTOSTERONE CYPIONATE Inject 0.5 mLs (100 mg total) into the muscle every 7 (seven) days.        Allergies:  Allergies  Allergen Reactions   No Known Allergies    Other     No narcotics please    Family History  Problem Relation Age of Onset   Cancer Mother    Diabetes Mother    Heart attack Mother    Stroke Mother    Hypertension Father    Thyroid  disease Maternal Grandmother    Thyroid  disease Maternal Grandfather    Thyroid  disease Paternal Grandmother    Thyroid  disease Paternal Grandfather    Cancer Other     Social History:  reports that he has been smoking cigarettes. He started smoking about 35 years ago. He has a 71.3 pack-year smoking history. He has  been exposed to tobacco smoke. He has never used smokeless tobacco. He reports current alcohol use. He reports that he does not currently use drugs after having used the following drugs: Cocaine and Marijuana.  ROS: A complete review of systems was performed.  All systems are negative except for pertinent findings as noted.  Physical Exam:  Vital signs in last 24 hours: There were no vitals taken for this visit. Constitutional:  Alert and oriented, No acute distress Cardiovascular: Regular rate  Respiratory: Normal respiratory effort Neurologic: Grossly intact, no focal deficits Psychiatric: Normal mood and affect  I have reviewed prior pt notes  I have reviewed urinalysis results  I have reviewed prior prolactin, testosterone , psa results

## 2024-08-24 ENCOUNTER — Other Ambulatory Visit: Payer: Self-pay

## 2024-08-24 ENCOUNTER — Ambulatory Visit (INDEPENDENT_AMBULATORY_CARE_PROVIDER_SITE_OTHER): Admitting: Urology

## 2024-08-24 VITALS — BP 135/70 | HR 71

## 2024-08-24 DIAGNOSIS — R7989 Other specified abnormal findings of blood chemistry: Secondary | ICD-10-CM | POA: Diagnosis not present

## 2024-08-24 DIAGNOSIS — E291 Testicular hypofunction: Secondary | ICD-10-CM | POA: Diagnosis not present

## 2024-08-24 DIAGNOSIS — Z0001 Encounter for general adult medical examination with abnormal findings: Secondary | ICD-10-CM | POA: Diagnosis not present

## 2024-08-24 DIAGNOSIS — E119 Type 2 diabetes mellitus without complications: Secondary | ICD-10-CM | POA: Diagnosis not present

## 2024-08-24 DIAGNOSIS — Z7151 Drug abuse counseling and surveillance of drug abuser: Secondary | ICD-10-CM | POA: Diagnosis not present

## 2024-08-24 DIAGNOSIS — F1120 Opioid dependence, uncomplicated: Secondary | ICD-10-CM | POA: Diagnosis not present

## 2024-08-24 LAB — URINALYSIS, ROUTINE W REFLEX MICROSCOPIC
Bilirubin, UA: NEGATIVE
Glucose, UA: NEGATIVE
Ketones, UA: NEGATIVE
Leukocytes,UA: NEGATIVE
Nitrite, UA: NEGATIVE
Protein,UA: NEGATIVE
RBC, UA: NEGATIVE
Specific Gravity, UA: 1.025 (ref 1.005–1.030)
Urobilinogen, Ur: 0.2 mg/dL (ref 0.2–1.0)
pH, UA: 6 (ref 5.0–7.5)

## 2024-08-25 DIAGNOSIS — F1120 Opioid dependence, uncomplicated: Secondary | ICD-10-CM | POA: Diagnosis not present

## 2024-08-25 DIAGNOSIS — Z7151 Drug abuse counseling and surveillance of drug abuser: Secondary | ICD-10-CM | POA: Diagnosis not present

## 2024-09-06 DIAGNOSIS — F1120 Opioid dependence, uncomplicated: Secondary | ICD-10-CM | POA: Diagnosis not present

## 2024-09-13 DIAGNOSIS — Z7151 Drug abuse counseling and surveillance of drug abuser: Secondary | ICD-10-CM | POA: Diagnosis not present

## 2024-09-13 DIAGNOSIS — F1120 Opioid dependence, uncomplicated: Secondary | ICD-10-CM | POA: Diagnosis not present

## 2024-09-14 DIAGNOSIS — F1120 Opioid dependence, uncomplicated: Secondary | ICD-10-CM | POA: Diagnosis not present

## 2024-09-14 DIAGNOSIS — Z7151 Drug abuse counseling and surveillance of drug abuser: Secondary | ICD-10-CM | POA: Diagnosis not present

## 2024-09-22 DIAGNOSIS — F1120 Opioid dependence, uncomplicated: Secondary | ICD-10-CM | POA: Diagnosis not present

## 2024-09-22 DIAGNOSIS — Z7151 Drug abuse counseling and surveillance of drug abuser: Secondary | ICD-10-CM | POA: Diagnosis not present

## 2024-09-28 DIAGNOSIS — Z7151 Drug abuse counseling and surveillance of drug abuser: Secondary | ICD-10-CM | POA: Diagnosis not present

## 2024-10-18 DIAGNOSIS — F1120 Opioid dependence, uncomplicated: Secondary | ICD-10-CM | POA: Diagnosis not present

## 2024-10-18 DIAGNOSIS — Z7151 Drug abuse counseling and surveillance of drug abuser: Secondary | ICD-10-CM | POA: Diagnosis not present

## 2024-10-19 DIAGNOSIS — Z7151 Drug abuse counseling and surveillance of drug abuser: Secondary | ICD-10-CM | POA: Diagnosis not present

## 2024-10-20 DIAGNOSIS — F1120 Opioid dependence, uncomplicated: Secondary | ICD-10-CM | POA: Diagnosis not present

## 2024-10-20 DIAGNOSIS — Z7151 Drug abuse counseling and surveillance of drug abuser: Secondary | ICD-10-CM | POA: Diagnosis not present

## 2024-10-26 DIAGNOSIS — Z7151 Drug abuse counseling and surveillance of drug abuser: Secondary | ICD-10-CM | POA: Diagnosis not present

## 2024-10-26 DIAGNOSIS — F1120 Opioid dependence, uncomplicated: Secondary | ICD-10-CM | POA: Diagnosis not present

## 2024-10-27 DIAGNOSIS — F1120 Opioid dependence, uncomplicated: Secondary | ICD-10-CM | POA: Diagnosis not present

## 2024-10-29 ENCOUNTER — Other Ambulatory Visit: Payer: Self-pay | Admitting: Urology

## 2024-10-29 DIAGNOSIS — R7989 Other specified abnormal findings of blood chemistry: Secondary | ICD-10-CM

## 2024-11-01 ENCOUNTER — Encounter: Payer: Self-pay | Admitting: Radiology

## 2024-11-01 ENCOUNTER — Other Ambulatory Visit: Payer: Self-pay

## 2024-11-01 DIAGNOSIS — R7989 Other specified abnormal findings of blood chemistry: Secondary | ICD-10-CM

## 2024-11-01 NOTE — Telephone Encounter (Signed)
 Tried calling pt with no answer. Unable to LVM due to mailbox full

## 2024-11-02 ENCOUNTER — Other Ambulatory Visit: Payer: Self-pay | Admitting: Urology

## 2024-11-02 ENCOUNTER — Other Ambulatory Visit: Payer: Self-pay

## 2024-11-02 DIAGNOSIS — R7989 Other specified abnormal findings of blood chemistry: Secondary | ICD-10-CM

## 2024-11-02 DIAGNOSIS — Z7151 Drug abuse counseling and surveillance of drug abuser: Secondary | ICD-10-CM | POA: Diagnosis not present

## 2024-11-02 DIAGNOSIS — F1120 Opioid dependence, uncomplicated: Secondary | ICD-10-CM | POA: Diagnosis not present

## 2024-11-02 MED ORDER — NEEDLE (DISP) 18G X 1-1/2" MISC: 12 refills | Status: AC

## 2024-11-02 MED ORDER — TESTOSTERONE CYPIONATE 200 MG/ML IM SOLN: 100.0000 mg | INTRAMUSCULAR | 1 refills | Status: AC

## 2024-11-02 MED ORDER — SAFETY SYRINGE/NEEDLE 22G X 1-1/2" 3 ML MISC: 12 refills | Status: AC

## 2024-11-02 NOTE — Telephone Encounter (Signed)
 Pt called due to needing a refill of testosterone  pt also requested the refills be sent tot [facility] pt advised a PA is possibly needed pt states he pays for Rx out of pocket Rx sent to MD for approval pt notified

## 2024-11-02 NOTE — Addendum Note (Signed)
 Addended by: SAMMIE EXIE HERO on: 11/02/2024 10:23 AM   Modules accepted: Orders

## 2024-11-09 DIAGNOSIS — Z7151 Drug abuse counseling and surveillance of drug abuser: Secondary | ICD-10-CM | POA: Diagnosis not present

## 2024-11-09 DIAGNOSIS — F1120 Opioid dependence, uncomplicated: Secondary | ICD-10-CM | POA: Diagnosis not present

## 2024-11-16 DIAGNOSIS — Z7151 Drug abuse counseling and surveillance of drug abuser: Secondary | ICD-10-CM | POA: Diagnosis not present

## 2024-11-17 DIAGNOSIS — Z7151 Drug abuse counseling and surveillance of drug abuser: Secondary | ICD-10-CM | POA: Diagnosis not present

## 2024-11-17 DIAGNOSIS — F1120 Opioid dependence, uncomplicated: Secondary | ICD-10-CM | POA: Diagnosis not present

## 2024-11-29 DIAGNOSIS — Z7151 Drug abuse counseling and surveillance of drug abuser: Secondary | ICD-10-CM | POA: Diagnosis not present

## 2024-11-29 DIAGNOSIS — F1120 Opioid dependence, uncomplicated: Secondary | ICD-10-CM | POA: Diagnosis not present

## 2024-12-13 DIAGNOSIS — F1120 Opioid dependence, uncomplicated: Secondary | ICD-10-CM | POA: Diagnosis not present

## 2024-12-13 DIAGNOSIS — Z7151 Drug abuse counseling and surveillance of drug abuser: Secondary | ICD-10-CM | POA: Diagnosis not present

## 2024-12-20 NOTE — Progress Notes (Deleted)
 Impression/Assessment:  -Low testosterone , on repletion with excellent symptomatic response.  Current testosterone  level, peak, is supraphysiologic  -High prolactin level--he saw an endocrinologist but has not necessarily followed up.  He has changed lifestyle somewhat-diet.  He had an MRI of his brain in 2023 that did not show any pituitary abnormalities.  Plan:    History of Present Illness: Here for f/u of low testosterone . He started on repletion (100 mg testosterone  cypionate IM q week) in August 2025  Recent labs--  Testosterone  level 642 (drawn on a Wednesday) PSA 0.4 Hemoglobin/hematocrit-16.7/49.7  He has missed some recent injections but overall tries to stay on schedule.  He is now injecting on Mondays.  8.26.2025: Recent testosterone  (drawn on a Wednesday) was 1260.  He feels good on his current injection regimen.  12.23.2025:   Past Medical History:  Diagnosis Date   Anxiety    Bronchitis    COPD (chronic obstructive pulmonary disease) (HCC)    Depression    Opiate addiction (HCC)    Panic attacks    Pneumonia    Testicular torsion     Past Surgical History:  Procedure Laterality Date   APPENDECTOMY     EYE SURGERY     NASAL SINUS SURGERY     VASECTOMY      Home Medications:  Allergies as of 12/21/2024       Reactions   No Known Allergies    Other    No narcotics please        Medication List        Accurate as of December 20, 2024 10:22 AM. If you have any questions, ask your nurse or doctor.          albuterol  108 (90 Base) MCG/ACT inhaler Commonly known as: VENTOLIN  HFA Inhale 2 puffs into the lungs 4 (four) times daily as needed.   aspirin  EC 81 MG tablet Take 1 tablet (81 mg total) by mouth daily. Swallow whole.   Buprenorphine  HCl-Naloxone  HCl 12-3 MG Film Place 1 strip under the tongue 2 (two) times daily.   ibuprofen  200 MG tablet Commonly known as: ADVIL  Take 800 mg by mouth 2 (two) times daily as needed. Taken once or  twice a day for pain   ipratropium-albuterol  0.5-2.5 (3) MG/3ML Soln Commonly known as: DUONEB Take 3 mLs by nebulization every 6 (six) hours as needed (shortness of breath).   metoprolol  tartrate 100 MG tablet Commonly known as: LOPRESSOR  Take (1) 100 Mg tablet 2 hours before procedure   NEEDLE (DISP) 18 G 18G X 1-1/2 Misc Use to draw up testosterone  for injection.   nitroGLYCERIN  0.4 MG SL tablet Commonly known as: NITROSTAT  Place 1 tablet (0.4 mg total) under the tongue every 5 (five) minutes as needed for chest pain.   Safety Syringe/Needle 22G X 1-1/2 3 ML Misc Generic drug: SYRINGE-NEEDLE (DISP) 3 ML Use to inject testosterone .   testosterone  cypionate 200 MG/ML injection Commonly known as: DEPOTESTOSTERONE CYPIONATE Inject 0.5 mLs (100 mg total) into the muscle every 7 (seven) days.        Allergies:  Allergies  Allergen Reactions   No Known Allergies    Other     No narcotics please    Family History  Problem Relation Age of Onset   Cancer Mother    Diabetes Mother    Heart attack Mother    Stroke Mother    Hypertension Father    Thyroid  disease Maternal Grandmother    Thyroid  disease Maternal Grandfather  Thyroid  disease Paternal Grandmother    Thyroid  disease Paternal Grandfather    Cancer Other     Social History:  reports that he has been smoking cigarettes. He started smoking about 35 years ago. He has a 71.9 pack-year smoking history. He has been exposed to tobacco smoke. He has never used smokeless tobacco. He reports current alcohol use. He reports that he does not currently use drugs after having used the following drugs: Cocaine and Marijuana.  ROS: A complete review of systems was performed.  All systems are negative except for pertinent findings as noted.   I have reviewed prior pt notes  I have reviewed urinalysis results--clear  I have reviewed prior prolactin, testosterone , hemoglobin/hematocrit and psa results

## 2024-12-21 ENCOUNTER — Ambulatory Visit: Admitting: Urology

## 2024-12-21 DIAGNOSIS — R7989 Other specified abnormal findings of blood chemistry: Secondary | ICD-10-CM

## 2025-03-08 ENCOUNTER — Ambulatory Visit: Admitting: Urology
